# Patient Record
Sex: Female | Born: 1935 | Race: White | Hispanic: No | Smoking: Never smoker
Health system: Southern US, Community
[De-identification: ages and names within clinical notes are randomized; demographics above are authoritative.]

## PROBLEM LIST (undated history)

## (undated) DIAGNOSIS — M1990 Unspecified osteoarthritis, unspecified site: Secondary | ICD-10-CM

## (undated) DIAGNOSIS — E785 Hyperlipidemia, unspecified: Secondary | ICD-10-CM

## (undated) DIAGNOSIS — R634 Abnormal weight loss: Secondary | ICD-10-CM

## (undated) DIAGNOSIS — R911 Solitary pulmonary nodule: Secondary | ICD-10-CM

## (undated) DIAGNOSIS — I2510 Atherosclerotic heart disease of native coronary artery without angina pectoris: Secondary | ICD-10-CM

## (undated) DIAGNOSIS — M4800 Spinal stenosis, site unspecified: Secondary | ICD-10-CM

## (undated) DIAGNOSIS — I679 Cerebrovascular disease, unspecified: Secondary | ICD-10-CM

## (undated) DIAGNOSIS — A310 Pulmonary mycobacterial infection: Secondary | ICD-10-CM

## (undated) DIAGNOSIS — K529 Noninfective gastroenteritis and colitis, unspecified: Secondary | ICD-10-CM

## (undated) DIAGNOSIS — K219 Gastro-esophageal reflux disease without esophagitis: Secondary | ICD-10-CM

## (undated) DIAGNOSIS — J189 Pneumonia, unspecified organism: Secondary | ICD-10-CM

## (undated) DIAGNOSIS — I219 Acute myocardial infarction, unspecified: Secondary | ICD-10-CM

## (undated) DIAGNOSIS — M549 Dorsalgia, unspecified: Secondary | ICD-10-CM

## (undated) DIAGNOSIS — C189 Malignant neoplasm of colon, unspecified: Secondary | ICD-10-CM

## (undated) DIAGNOSIS — I10 Essential (primary) hypertension: Secondary | ICD-10-CM

## (undated) HISTORY — DX: Malignant neoplasm of colon, unspecified: C18.9

## (undated) HISTORY — DX: Gastro-esophageal reflux disease without esophagitis: K21.9

## (undated) HISTORY — DX: Unspecified osteoarthritis, unspecified site: M19.90

## (undated) HISTORY — DX: Abnormal weight loss: R63.4

## (undated) HISTORY — DX: Solitary pulmonary nodule: R91.1

## (undated) HISTORY — PX: TONSILLECTOMY: SUR1361

## (undated) HISTORY — DX: Pneumonia, unspecified organism: J18.9

## (undated) HISTORY — PX: BLADDER SUSPENSION: SHX72

## (undated) HISTORY — DX: Atherosclerotic heart disease of native coronary artery without angina pectoris: I25.10

## (undated) HISTORY — DX: Essential (primary) hypertension: I10

## (undated) HISTORY — PX: CATARACT EXTRACTION: SUR2

## (undated) HISTORY — PX: EYE SURGERY: SHX253

## (undated) HISTORY — DX: Pulmonary mycobacterial infection: A31.0

## (undated) HISTORY — PX: APPENDECTOMY: SHX54

## (undated) HISTORY — DX: Cerebrovascular disease, unspecified: I67.9

## (undated) HISTORY — PX: ESOPHAGOGASTRODUODENOSCOPY: SHX1529

## (undated) HISTORY — PX: KYPHOSIS SURGERY: SHX114

## (undated) HISTORY — PX: COLONOSCOPY: SHX174

---

## 1972-04-11 HISTORY — PX: TOTAL ABDOMINAL HYSTERECTOMY: SHX209

## 1992-04-11 DIAGNOSIS — I2510 Atherosclerotic heart disease of native coronary artery without angina pectoris: Secondary | ICD-10-CM

## 1992-04-11 HISTORY — DX: Atherosclerotic heart disease of native coronary artery without angina pectoris: I25.10

## 1994-04-11 DIAGNOSIS — C189 Malignant neoplasm of colon, unspecified: Secondary | ICD-10-CM

## 1994-04-11 HISTORY — PX: PARTIAL COLECTOMY: SHX5273

## 1994-04-11 HISTORY — DX: Malignant neoplasm of colon, unspecified: C18.9

## 2000-04-11 DIAGNOSIS — K219 Gastro-esophageal reflux disease without esophagitis: Secondary | ICD-10-CM

## 2000-04-11 HISTORY — DX: Gastro-esophageal reflux disease without esophagitis: K21.9

## 2000-09-27 ENCOUNTER — Ambulatory Visit (HOSPITAL_COMMUNITY): Admission: RE | Admit: 2000-09-27 | Discharge: 2000-09-27 | Payer: Self-pay | Admitting: Internal Medicine

## 2000-09-27 ENCOUNTER — Encounter: Payer: Self-pay | Admitting: Internal Medicine

## 2000-10-20 ENCOUNTER — Ambulatory Visit (HOSPITAL_COMMUNITY): Admission: RE | Admit: 2000-10-20 | Discharge: 2000-10-20 | Payer: Self-pay | Admitting: Internal Medicine

## 2001-07-17 ENCOUNTER — Ambulatory Visit (HOSPITAL_COMMUNITY): Admission: RE | Admit: 2001-07-17 | Discharge: 2001-07-17 | Payer: Self-pay | Admitting: Cardiology

## 2002-04-08 ENCOUNTER — Encounter: Payer: Self-pay | Admitting: Orthopaedic Surgery

## 2002-04-10 ENCOUNTER — Ambulatory Visit (HOSPITAL_COMMUNITY): Admission: RE | Admit: 2002-04-10 | Discharge: 2002-04-11 | Payer: Self-pay | Admitting: Orthopaedic Surgery

## 2002-04-10 ENCOUNTER — Encounter: Payer: Self-pay | Admitting: Orthopaedic Surgery

## 2002-04-10 ENCOUNTER — Encounter (INDEPENDENT_AMBULATORY_CARE_PROVIDER_SITE_OTHER): Payer: Self-pay | Admitting: Specialist

## 2002-09-17 ENCOUNTER — Encounter: Payer: Self-pay | Admitting: Obstetrics and Gynecology

## 2002-09-17 ENCOUNTER — Ambulatory Visit (HOSPITAL_COMMUNITY): Admission: RE | Admit: 2002-09-17 | Discharge: 2002-09-17 | Payer: Self-pay | Admitting: Obstetrics and Gynecology

## 2002-09-17 ENCOUNTER — Encounter: Payer: Self-pay | Admitting: Urology

## 2002-09-17 ENCOUNTER — Ambulatory Visit (HOSPITAL_COMMUNITY): Admission: RE | Admit: 2002-09-17 | Discharge: 2002-09-17 | Payer: Self-pay | Admitting: Urology

## 2003-02-21 ENCOUNTER — Ambulatory Visit (HOSPITAL_COMMUNITY): Admission: RE | Admit: 2003-02-21 | Discharge: 2003-02-21 | Payer: Self-pay | Admitting: Obstetrics and Gynecology

## 2003-02-27 ENCOUNTER — Encounter: Payer: Self-pay | Admitting: Cardiology

## 2003-07-03 ENCOUNTER — Ambulatory Visit (HOSPITAL_COMMUNITY): Admission: RE | Admit: 2003-07-03 | Discharge: 2003-07-03 | Payer: Self-pay | Admitting: Internal Medicine

## 2003-07-11 ENCOUNTER — Ambulatory Visit (HOSPITAL_COMMUNITY): Admission: RE | Admit: 2003-07-11 | Discharge: 2003-07-11 | Payer: Self-pay | Admitting: Internal Medicine

## 2003-09-04 ENCOUNTER — Ambulatory Visit (HOSPITAL_COMMUNITY): Admission: RE | Admit: 2003-09-04 | Discharge: 2003-09-04 | Payer: Self-pay | Admitting: Colon and Rectal Surgery

## 2004-02-26 ENCOUNTER — Ambulatory Visit: Payer: Self-pay | Admitting: Cardiology

## 2004-03-01 ENCOUNTER — Ambulatory Visit (HOSPITAL_COMMUNITY): Admission: RE | Admit: 2004-03-01 | Discharge: 2004-03-01 | Payer: Self-pay | Admitting: Cardiology

## 2004-03-01 ENCOUNTER — Ambulatory Visit: Payer: Self-pay | Admitting: Cardiology

## 2004-03-15 ENCOUNTER — Ambulatory Visit: Payer: Self-pay | Admitting: Internal Medicine

## 2004-03-23 ENCOUNTER — Ambulatory Visit: Payer: Self-pay | Admitting: Internal Medicine

## 2004-03-23 ENCOUNTER — Ambulatory Visit (HOSPITAL_COMMUNITY): Admission: RE | Admit: 2004-03-23 | Discharge: 2004-03-23 | Payer: Self-pay | Admitting: Internal Medicine

## 2004-11-18 ENCOUNTER — Ambulatory Visit (HOSPITAL_COMMUNITY): Admission: RE | Admit: 2004-11-18 | Discharge: 2004-11-18 | Payer: Self-pay | Admitting: Obstetrics and Gynecology

## 2004-11-30 ENCOUNTER — Encounter: Admission: RE | Admit: 2004-11-30 | Discharge: 2004-11-30 | Payer: Self-pay | Admitting: Obstetrics and Gynecology

## 2005-03-18 ENCOUNTER — Ambulatory Visit: Payer: Self-pay | Admitting: Cardiology

## 2005-03-25 ENCOUNTER — Ambulatory Visit: Payer: Self-pay | Admitting: *Deleted

## 2005-03-25 ENCOUNTER — Encounter (HOSPITAL_COMMUNITY): Admission: RE | Admit: 2005-03-25 | Discharge: 2005-03-25 | Payer: Self-pay | Admitting: Cardiology

## 2005-04-07 ENCOUNTER — Ambulatory Visit (HOSPITAL_COMMUNITY): Admission: RE | Admit: 2005-04-07 | Discharge: 2005-04-07 | Payer: Self-pay | Admitting: Cardiology

## 2005-04-07 ENCOUNTER — Ambulatory Visit: Payer: Self-pay | Admitting: Cardiology

## 2005-04-12 ENCOUNTER — Inpatient Hospital Stay (HOSPITAL_BASED_OUTPATIENT_CLINIC_OR_DEPARTMENT_OTHER): Admission: RE | Admit: 2005-04-12 | Discharge: 2005-04-12 | Payer: Self-pay | Admitting: Cardiology

## 2005-04-12 ENCOUNTER — Ambulatory Visit: Payer: Self-pay | Admitting: Cardiology

## 2005-05-02 ENCOUNTER — Ambulatory Visit: Payer: Self-pay | Admitting: *Deleted

## 2005-06-23 ENCOUNTER — Encounter (HOSPITAL_BASED_OUTPATIENT_CLINIC_OR_DEPARTMENT_OTHER): Admission: RE | Admit: 2005-06-23 | Discharge: 2005-09-21 | Payer: Self-pay | Admitting: Surgery

## 2005-07-29 ENCOUNTER — Ambulatory Visit: Payer: Self-pay | Admitting: Cardiology

## 2005-08-01 ENCOUNTER — Ambulatory Visit (HOSPITAL_COMMUNITY): Admission: RE | Admit: 2005-08-01 | Discharge: 2005-08-01 | Payer: Self-pay | Admitting: Cardiology

## 2005-09-21 ENCOUNTER — Ambulatory Visit: Payer: Self-pay | Admitting: Cardiology

## 2005-10-27 ENCOUNTER — Emergency Department (HOSPITAL_COMMUNITY): Admission: EM | Admit: 2005-10-27 | Discharge: 2005-10-27 | Payer: Self-pay | Admitting: Emergency Medicine

## 2005-11-13 ENCOUNTER — Encounter: Admission: RE | Admit: 2005-11-13 | Discharge: 2005-11-13 | Payer: Self-pay | Admitting: Orthopaedic Surgery

## 2005-12-01 ENCOUNTER — Ambulatory Visit (HOSPITAL_COMMUNITY): Admission: RE | Admit: 2005-12-01 | Discharge: 2005-12-01 | Payer: Self-pay | Admitting: Obstetrics and Gynecology

## 2005-12-05 ENCOUNTER — Ambulatory Visit (HOSPITAL_BASED_OUTPATIENT_CLINIC_OR_DEPARTMENT_OTHER): Admission: RE | Admit: 2005-12-05 | Discharge: 2005-12-05 | Payer: Self-pay | Admitting: Orthopaedic Surgery

## 2006-01-19 ENCOUNTER — Encounter (HOSPITAL_COMMUNITY): Admission: RE | Admit: 2006-01-19 | Discharge: 2006-02-18 | Payer: Self-pay | Admitting: Family Medicine

## 2006-02-02 ENCOUNTER — Ambulatory Visit: Payer: Self-pay | Admitting: Internal Medicine

## 2006-02-07 ENCOUNTER — Ambulatory Visit (HOSPITAL_COMMUNITY): Admission: RE | Admit: 2006-02-07 | Discharge: 2006-02-07 | Payer: Self-pay | Admitting: Internal Medicine

## 2006-02-20 ENCOUNTER — Ambulatory Visit: Payer: Self-pay | Admitting: Internal Medicine

## 2006-03-07 ENCOUNTER — Ambulatory Visit (HOSPITAL_COMMUNITY): Admission: RE | Admit: 2006-03-07 | Discharge: 2006-03-07 | Payer: Self-pay | Admitting: Internal Medicine

## 2006-03-07 ENCOUNTER — Ambulatory Visit: Payer: Self-pay | Admitting: Internal Medicine

## 2006-10-16 ENCOUNTER — Ambulatory Visit (HOSPITAL_COMMUNITY): Admission: RE | Admit: 2006-10-16 | Discharge: 2006-10-16 | Payer: Self-pay | Admitting: Cardiology

## 2006-10-20 ENCOUNTER — Ambulatory Visit: Payer: Self-pay | Admitting: Cardiology

## 2007-04-12 DIAGNOSIS — R634 Abnormal weight loss: Secondary | ICD-10-CM

## 2007-04-12 HISTORY — DX: Abnormal weight loss: R63.4

## 2007-06-04 ENCOUNTER — Encounter: Payer: Self-pay | Admitting: Cardiology

## 2007-06-04 ENCOUNTER — Ambulatory Visit (HOSPITAL_COMMUNITY): Admission: RE | Admit: 2007-06-04 | Discharge: 2007-06-04 | Payer: Self-pay | Admitting: Family Medicine

## 2007-06-05 ENCOUNTER — Ambulatory Visit (HOSPITAL_COMMUNITY): Admission: RE | Admit: 2007-06-05 | Discharge: 2007-06-05 | Payer: Self-pay | Admitting: Family Medicine

## 2007-06-14 ENCOUNTER — Ambulatory Visit: Payer: Self-pay | Admitting: Internal Medicine

## 2007-06-22 ENCOUNTER — Ambulatory Visit (HOSPITAL_COMMUNITY): Admission: RE | Admit: 2007-06-22 | Discharge: 2007-06-22 | Payer: Self-pay | Admitting: Internal Medicine

## 2007-06-22 ENCOUNTER — Ambulatory Visit: Payer: Self-pay | Admitting: Internal Medicine

## 2007-07-05 ENCOUNTER — Ambulatory Visit: Payer: Self-pay | Admitting: Cardiology

## 2007-07-09 ENCOUNTER — Encounter: Payer: Self-pay | Admitting: Cardiology

## 2007-07-09 ENCOUNTER — Ambulatory Visit: Payer: Self-pay | Admitting: Cardiology

## 2007-07-09 ENCOUNTER — Ambulatory Visit (HOSPITAL_COMMUNITY): Admission: RE | Admit: 2007-07-09 | Discharge: 2007-07-09 | Payer: Self-pay | Admitting: Cardiology

## 2007-08-16 ENCOUNTER — Ambulatory Visit (HOSPITAL_COMMUNITY): Admission: RE | Admit: 2007-08-16 | Discharge: 2007-08-16 | Payer: Self-pay | Admitting: Family Medicine

## 2008-08-15 ENCOUNTER — Encounter: Payer: Self-pay | Admitting: Cardiology

## 2008-09-10 ENCOUNTER — Ambulatory Visit (HOSPITAL_COMMUNITY): Admission: RE | Admit: 2008-09-10 | Discharge: 2008-09-10 | Payer: Self-pay | Admitting: Family Medicine

## 2008-09-13 ENCOUNTER — Emergency Department (HOSPITAL_COMMUNITY): Admission: EM | Admit: 2008-09-13 | Discharge: 2008-09-13 | Payer: Self-pay | Admitting: Emergency Medicine

## 2008-10-07 DIAGNOSIS — M1990 Unspecified osteoarthritis, unspecified site: Secondary | ICD-10-CM | POA: Insufficient documentation

## 2008-10-29 ENCOUNTER — Ambulatory Visit (HOSPITAL_COMMUNITY): Admission: RE | Admit: 2008-10-29 | Discharge: 2008-10-29 | Payer: Self-pay | Admitting: Family Medicine

## 2008-10-29 ENCOUNTER — Encounter: Payer: Self-pay | Admitting: Cardiology

## 2008-10-31 ENCOUNTER — Ambulatory Visit (HOSPITAL_COMMUNITY): Admission: RE | Admit: 2008-10-31 | Discharge: 2008-10-31 | Payer: Self-pay | Admitting: Family Medicine

## 2008-12-23 ENCOUNTER — Encounter (INDEPENDENT_AMBULATORY_CARE_PROVIDER_SITE_OTHER): Payer: Self-pay | Admitting: *Deleted

## 2009-01-07 ENCOUNTER — Ambulatory Visit: Payer: Self-pay | Admitting: Internal Medicine

## 2009-01-09 ENCOUNTER — Encounter: Payer: Self-pay | Admitting: Internal Medicine

## 2009-01-14 LAB — CONVERTED CEMR LAB
Bilirubin, Direct: 0.1 mg/dL (ref 0.0–0.3)
Indirect Bilirubin: 0.4 mg/dL (ref 0.0–0.9)
Total Bilirubin: 0.5 mg/dL (ref 0.3–1.2)

## 2009-01-21 ENCOUNTER — Ambulatory Visit (HOSPITAL_COMMUNITY): Admission: RE | Admit: 2009-01-21 | Discharge: 2009-01-21 | Payer: Self-pay | Admitting: Internal Medicine

## 2009-01-21 ENCOUNTER — Ambulatory Visit: Payer: Self-pay | Admitting: Internal Medicine

## 2009-02-18 ENCOUNTER — Encounter: Payer: Self-pay | Admitting: Internal Medicine

## 2009-02-25 ENCOUNTER — Ambulatory Visit (HOSPITAL_COMMUNITY): Admission: RE | Admit: 2009-02-25 | Discharge: 2009-02-25 | Payer: Self-pay | Admitting: Family Medicine

## 2009-04-11 DIAGNOSIS — K529 Noninfective gastroenteritis and colitis, unspecified: Secondary | ICD-10-CM

## 2009-04-11 HISTORY — DX: Noninfective gastroenteritis and colitis, unspecified: K52.9

## 2009-06-19 ENCOUNTER — Ambulatory Visit (HOSPITAL_COMMUNITY): Admission: RE | Admit: 2009-06-19 | Discharge: 2009-06-19 | Payer: Self-pay | Admitting: Family Medicine

## 2009-06-19 ENCOUNTER — Encounter (INDEPENDENT_AMBULATORY_CARE_PROVIDER_SITE_OTHER): Payer: Self-pay | Admitting: *Deleted

## 2009-06-19 LAB — CONVERTED CEMR LAB
Albumin: 4 g/dL
Albumin: 4 g/dL
Alkaline Phosphatase: 88 units/L
BUN: 17 mg/dL
Basophils Relative: 0 %
CO2: 25 meq/L
Calcium: 9.3 mg/dL
Chloride: 101 meq/L
Creatinine, Ser: 0.87 mg/dL
Creatinine, Ser: 0.87 mg/dL
Eosinophils Absolute: 0.2 10*3/uL
GFR calc non Af Amer: >60 mL/min
Glomerular Filtration Rate, Af Am: >60 mL/min/{1.73_m2}
Glomerular Filtration Rate, Af Am: >60 mL/min/{1.73_m2}
Glucose, Bld: 93 mg/dL
Glucose, Bld: 93 mg/dL
HCT: 37.1 %
HCT: 37.1 %
Hemoglobin: 12.8 g/dL
Hemoglobin: 12.8 g/dL
Lipase: 16 units/L
Lymphs Abs: 2 10*3/uL
Monocytes Absolute: 0.7 10*3/uL
Monocytes Relative: 10 %
Potassium: 3.9 meq/L
Sodium: 133 meq/L
Total Protein: 7 g/dL
WBC: 7.2 10*3/uL
WBC: 7.2 10*3/uL

## 2009-06-22 ENCOUNTER — Encounter: Payer: Self-pay | Admitting: Urgent Care

## 2009-06-30 ENCOUNTER — Ambulatory Visit (HOSPITAL_COMMUNITY): Admission: RE | Admit: 2009-06-30 | Discharge: 2009-06-30 | Payer: Self-pay | Admitting: Family Medicine

## 2009-07-06 ENCOUNTER — Encounter (HOSPITAL_COMMUNITY): Admission: RE | Admit: 2009-07-06 | Discharge: 2009-08-05 | Payer: Self-pay | Admitting: Family Medicine

## 2009-10-05 ENCOUNTER — Ambulatory Visit (HOSPITAL_COMMUNITY): Admission: RE | Admit: 2009-10-05 | Discharge: 2009-10-05 | Payer: Self-pay | Admitting: Preventative Medicine

## 2009-10-21 ENCOUNTER — Emergency Department (HOSPITAL_COMMUNITY): Admission: EM | Admit: 2009-10-21 | Discharge: 2009-10-21 | Payer: Self-pay | Admitting: Emergency Medicine

## 2009-10-30 ENCOUNTER — Ambulatory Visit: Payer: Self-pay | Admitting: Internal Medicine

## 2009-10-30 DIAGNOSIS — K5289 Other specified noninfective gastroenteritis and colitis: Secondary | ICD-10-CM

## 2009-11-02 LAB — CONVERTED CEMR LAB
Free T4: 0.99 ng/dL (ref 0.80–1.80)
TSH: 2.181 microintl units/mL (ref 0.350–4.500)

## 2009-11-03 ENCOUNTER — Encounter: Payer: Self-pay | Admitting: Internal Medicine

## 2009-11-03 ENCOUNTER — Encounter: Payer: Self-pay | Admitting: Gastroenterology

## 2009-11-05 ENCOUNTER — Ambulatory Visit (HOSPITAL_COMMUNITY): Admission: RE | Admit: 2009-11-05 | Discharge: 2009-11-05 | Payer: Self-pay | Admitting: Internal Medicine

## 2009-11-13 ENCOUNTER — Encounter: Payer: Self-pay | Admitting: Gastroenterology

## 2009-11-18 ENCOUNTER — Encounter: Payer: Self-pay | Admitting: Internal Medicine

## 2009-11-24 ENCOUNTER — Ambulatory Visit: Payer: Self-pay | Admitting: Internal Medicine

## 2009-11-24 ENCOUNTER — Ambulatory Visit (HOSPITAL_COMMUNITY): Admission: RE | Admit: 2009-11-24 | Discharge: 2009-11-24 | Payer: Self-pay | Admitting: Internal Medicine

## 2009-12-03 ENCOUNTER — Telehealth (INDEPENDENT_AMBULATORY_CARE_PROVIDER_SITE_OTHER): Payer: Self-pay

## 2009-12-24 ENCOUNTER — Encounter (INDEPENDENT_AMBULATORY_CARE_PROVIDER_SITE_OTHER): Payer: Self-pay | Admitting: *Deleted

## 2009-12-25 ENCOUNTER — Ambulatory Visit: Payer: Self-pay | Admitting: Cardiology

## 2009-12-25 DIAGNOSIS — I679 Cerebrovascular disease, unspecified: Secondary | ICD-10-CM

## 2009-12-29 ENCOUNTER — Encounter: Payer: Self-pay | Admitting: Cardiology

## 2010-01-01 ENCOUNTER — Ambulatory Visit (HOSPITAL_COMMUNITY)
Admission: RE | Admit: 2010-01-01 | Discharge: 2010-01-01 | Payer: Self-pay | Source: Home / Self Care | Admitting: Cardiology

## 2010-01-06 ENCOUNTER — Encounter: Payer: Self-pay | Admitting: Cardiology

## 2010-01-11 ENCOUNTER — Telehealth (INDEPENDENT_AMBULATORY_CARE_PROVIDER_SITE_OTHER): Payer: Self-pay | Admitting: *Deleted

## 2010-01-14 ENCOUNTER — Telehealth (INDEPENDENT_AMBULATORY_CARE_PROVIDER_SITE_OTHER): Payer: Self-pay | Admitting: *Deleted

## 2010-05-02 ENCOUNTER — Encounter: Payer: Self-pay | Admitting: Obstetrics and Gynecology

## 2010-05-11 NOTE — Letter (Signed)
Summary: tcs/egd order  tcs/egd order   Imported By: Hendricks Limes LPN 16/01/9603 54:09:81  _____________________________________________________________________  External Attachment:    Type:   Image     Comment:   External Document

## 2010-05-11 NOTE — Letter (Signed)
Summary: ORDERS  ORDERS   Imported By: Faythe Ghee 12/29/2009 09:59:47  _____________________________________________________________________  External Attachment:    Type:   Image     Comment:   External Document

## 2010-05-11 NOTE — Medication Information (Signed)
Summary: PREVACID 24 HR OTC 15MG   PREVACID 24 HR OTC 15MG    Imported By: Rexene Alberts 11/13/2009 11:06:27  _____________________________________________________________________  External Attachment:    Type:   Image     Comment:   External Document  Appended Document: PREVACID 24 HR OTC 15MG     Prescriptions: PREVACID 24HR 15 MG CPDR (LANSOPRAZOLE) one by mouth 30 mins before breakfast daily  #42 x 11   Entered and Authorized by:   Leanna Battles. Dixon Boos   Signed by:   Leanna Battles Dixon Boos on 11/13/2009   Method used:   Electronically to        [facility] (retail)       [address]       [place], [place]  [address]       Ph: [phone]       Fax: [phone]   RxID:   [id number]

## 2010-05-11 NOTE — Progress Notes (Signed)
   Phone Note Call from Patient   Caller: [name] Reason for Call: Talk to Nurse Summary of Call: Kayla Lane called to check on the form she left with the nurse on her last visit 12/25/2009 to have her medications filled by mail.  She has note heard anything and would like to follow up with the nurse.  I looked into the EMR and did not see any document for mail ordering meds.  You can call her at [phone] Initial call taken by: Alexis Goodell  Follow-up for Phone Call        I spoke with pt and she requested 90 day supplies from [facility] Follow-up by: Teressa Lower RN,  January 12, 2010 9:57 AM    Prescriptions: COZAAR 100 MG TABS (LOSARTAN POTASSIUM) take 1 tab dialy  # 90 x 4   Entered by:   Teressa Lower RN   Authorized by:   Kathlen Brunswick, MD, [facility]   Signed by:   Teressa Lower RN on 01/12/2010   Method used:   Electronically to        [facility] (retail)       [address]       [place], [place]  [address]       Ph: [phone]       Fax: [phone]   RxID:   [id number] BISOPROLOL-HYDROCHLOROTHIAZIDE 5-6.25 MG TABS (BISOPROLOL-HYDROCHLOROTHIAZIDE) Take 1 tablet by mouth once a day  #90 x 4   Entered by:   Teressa Lower RN   Authorized by:   Kathlen Brunswick, MD, [facility]   Signed by:   Teressa Lower RN on 01/12/2010   Method used:   Electronically to        [facility] (retail)       [address]       [place], [place]  [address]       Ph: [phone]       Fax: [phone]   RxID:   [id number] SIMVASTATIN 40 MG TABS (SIMVASTATIN) Take one tablet by mouth daily at bedtime  #90 x 4   Entered by:   Teressa Lower RN   Authorized by:   Kathlen Brunswick, MD, [facility]   Signed by:   Teressa Lower RN on 01/12/2010   Method used:   Electronically to        [facility] (retail)       [address]       [place], [place]  [address]       Ph:  [phone]       Fax: [phone]   RxID:   [id number]

## 2010-05-11 NOTE — Medication Information (Signed)
Summary: Tax adviser   Imported By: Diana Eves 06/22/2009 14:48:54  _____________________________________________________________________  External Attachment:    Type:   Image     Comment:   External Document  Appended Document: RX Folder per RMR OP note, pt on prevacid 30mg  daily, please verify dose w/ pt prior to RF  Appended Document: RX Folder spoke to pt- she is taking prevacid 15mg . Last ov note has prevacid 15mg .  Appended Document: pREVACID    Prescriptions: PREVACID 15 MG CPDR (LANSOPRAZOLE) Take 1 tablet by mouth once a day  #31 x 11   Entered and Authorized by:   Joselyn Arrow FNP-BC   Signed by:   Joselyn Arrow FNP-BC on 06/22/2009   Method used:   Electronically to        [facility] (mail-order)             ,          Ph: [phone]       Fax: [phone]   RxID:   [id number]     Appended Document: RX Folder Disregard [facility], please call Domenic Schwab w/ rx as above  Appended Document: RX Folder spoke to [place] [facility]- she said they have it

## 2010-05-11 NOTE — Letter (Signed)
Summary: records from [facility]  records from [facility]   Imported By: Rosine Beat 11/03/2009 09:47:08  _____________________________________________________________________  External Attachment:    Type:   Image     Comment:   External Document

## 2010-05-11 NOTE — Miscellaneous (Signed)
Summary: LABS CMP,CBCD,06/19/2009  Clinical Lists Changes  Observations: Added new observation of CALCIUM: 9.3 mg/dL (21/30/8657 84:69) Added new observation of ALBUMIN: 4.0 g/dL (62/95/2841 32:44) Added new observation of PROTEIN, TOT: 7.0 g/dL (04/13/7251 66:44) Added new observation of SGPT (ALT): 17 units/L (06/19/2009 10:26) Added new observation of SGOT (AST): 30 units/L (06/19/2009 10:26) Added new observation of ALK PHOS: 88 units/L (06/19/2009 10:26) Added new observation of GFR AA: >60 mL/min/1.59m2 (06/19/2009 10:26) Added new observation of GFR: >60 mL/min (06/19/2009 10:26) Added new observation of CREATININE: 0.87 mg/dL (03/47/4259 56:38) Added new observation of BUN: 17 mg/dL (75/64/3329 51:88) Added new observation of BG RANDOM: 93 mg/dL (41/66/0630 16:01) Added new observation of CO2 PLSM/SER: 25 meq/L (06/19/2009 10:26) Added new observation of CL SERUM: 101 meq/L (06/19/2009 10:26) Added new observation of K SERUM: 3.9 meq/L (06/19/2009 10:26) Added new observation of NA: 133 meq/L (06/19/2009 10:26) Added new observation of ABSOLUTE BAS: 0.0 K/uL (06/19/2009 10:26) Added new observation of BASOPHIL %: 0 % (06/19/2009 10:26) Added new observation of EOS ABSLT: 0.2 K/uL (06/19/2009 10:26) Added new observation of % EOS AUTO: 3 % (06/19/2009 10:26) Added new observation of ABSOLUTE MON: 0.7 K/uL (06/19/2009 10:26) Added new observation of MONOCYTE %: 10 % (06/19/2009 10:26) Added new observation of ABS LYMPHOCY: 2.0 K/uL (06/19/2009 10:26) Added new observation of LYMPHS %: 27 % (06/19/2009 10:26) Added new observation of PLATELETK/UL: 201 K/uL (06/19/2009 10:26) Added new observation of RDW: 14.2 % (06/19/2009 10:26) Added new observation of MCHC RBC: 34.4 g/dL (09/32/3557 32:20) Added new observation of MCV: 93.5 fL (06/19/2009 10:26) Added new observation of HCT: 37.1 % (06/19/2009 10:26) Added new observation of HGB: 12.8 g/dL (25/42/7062 37:62) Added new  observation of RBC M/UL: 3.96 M/uL (06/19/2009 10:26) Added new observation of WBC COUNT: 7.2 10*3/microliter (06/19/2009 10:26)

## 2010-05-11 NOTE — Miscellaneous (Signed)
Summary: CHEST CT 11/05/2009 , ECHO 07/09/2007  Clinical Lists Changes  Observations: Added new observation of CT OF CHEST:   Clinical Data: Chronic cough and weight loss.  Abnormal chest CT 1   year ago.    CT CHEST WITH CONTRAST    Technique:  Multidetector CT imaging of the chest was performed   following the standard protocol during bolus administration of   intravenous contrast.    Contrast: 80 ml Omnipaque-300    Comparison: 10/31/2008    Findings: High right paratracheal lymph nodes measure up to 6 mm   short axis, stable.  Low right paratracheal lymph node measures 7   mm, stable.  No hilar or axillary adenopathy.  Pulmonary arteries   are enlarged.  Coronary artery calcification.  Heart size normal.   No pericardial effusion.    Minimal biapical pleural parenchymal scarring.  There are scattered   areas of residual peribronchovascular nodularity, which are seen in   association with mild bronchiectasis and scattered areas of volume   loss.  Findings have improved when compared with 10/31/2008.  There   are calcified granulomas in the lingula.  No dominant pulmonary   nodule.  No pleural fluid.  Airway is unremarkable.    Incidental imaging of the upper abdomen shows renal cortical   scarring bilaterally.  Gastric wall appears thickened, as on   10/31/2008.  No worrisome lytic or sclerotic lesions.  Degenerative   changes are seen in the spine.  T12 vertebroplasty is noted.    IMPRESSION:   Pulmonary parenchymal pattern of scattered  peribronchovascular   nodularity, bronchiectasis and volume loss has improved in the   interval and has the appearance of post infectious scarring.   Mycobacterium avium complex is also considered.  (11/05/2009 10:32) Added new observation of ECHOINTERP:   SUMMARY   -  Overall left ventricular systolic function was normal. There were         no left ventricular regional wall motion abnormalities.   -  The aortic valve was mildly  calcified. There was trivial aortic         valvular regurgitation.   -  There was mild mitral annular calcification. There was mild to         moderate mitral valvular regurgitation. The effective orifice         of mitral regurgitation by proximal isovelocity surface area         was 0.06 cm^2. The volume of mitral regurgitation by proximal         isovelocity surface area was 10 cc.   -  Left atrial size was at the upper limits of normal.   -  The estimated peak right ventricular systolic pressure was mildly         increased.    COMPARISONS   -  Compared to the previous study of 01-Mar-2004 :no significant         interval change     ---------------------------------------------------------------    Prepared and Electronically Authenticated by    [place] Bing M.D.   Confirmed 10-Jul-2007 09:20:39  Additional Information (07/09/2007 10:33)      CT of Chest  Procedure date:  11/05/2009  Findings:        Clinical Data: Chronic cough and weight loss.  Abnormal chest CT 1   year ago.    CT CHEST WITH CONTRAST    Technique:  Multidetector CT imaging of the chest was performed   following the standard protocol during  bolus administration of   intravenous contrast.    Contrast: 80 ml Omnipaque-300    Comparison: 10/31/2008    Findings: High right paratracheal lymph nodes measure up to 6 mm   short axis, stable.  Low right paratracheal lymph node measures 7   mm, stable.  No hilar or axillary adenopathy.  Pulmonary arteries   are enlarged.  Coronary artery calcification.  Heart size normal.   No pericardial effusion.    Minimal biapical pleural parenchymal scarring.  There are scattered   areas of residual peribronchovascular nodularity, which are seen in   association with mild bronchiectasis and scattered areas of volume   loss.  Findings have improved when compared with 10/31/2008.  There   are calcified granulomas in the lingula.  No dominant pulmonary    nodule.  No pleural fluid.  Airway is unremarkable.    Incidental imaging of the upper abdomen shows renal cortical   scarring bilaterally.  Gastric wall appears thickened, as on   10/31/2008.  No worrisome lytic or sclerotic lesions.  Degenerative   changes are seen in the spine.  T12 vertebroplasty is noted.    IMPRESSION:   Pulmonary parenchymal pattern of scattered  peribronchovascular   nodularity, bronchiectasis and volume loss has improved in the   interval and has the appearance of post infectious scarring.   Mycobacterium avium complex is also considered.   Echocardiogram  Procedure date:  07/09/2007  Findings:        SUMMARY   -  Overall left ventricular systolic function was normal. There were         no left ventricular regional wall motion abnormalities.   -  The aortic valve was mildly calcified. There was trivial aortic         valvular regurgitation.   -  There was mild mitral annular calcification. There was mild to         moderate mitral valvular regurgitation. The effective orifice         of mitral regurgitation by proximal isovelocity surface area         was 0.06 cm^2. The volume of mitral regurgitation by proximal         isovelocity surface area was 10 cc.   -  Left atrial size was at the upper limits of normal.   -  The estimated peak right ventricular systolic pressure was mildly         increased.    COMPARISONS   -  Compared to the previous study of 01-Mar-2004 :no significant         interval change     ---------------------------------------------------------------    Prepared and Electronically Authenticated by    [place] Bing M.D.   Confirmed 10-Jul-2007 09:20:39  Additional Information

## 2010-05-11 NOTE — Assessment & Plan Note (Signed)
Summary: POSITIVE HX OF COLON CANCER,ABD PAIN,WT LOSS OF 6LBS IN ONE M...   Visit Type:  Initial Consult Referring Provider:  Fusco/PA Primary Care Provider:  Phillips Odor  Chief Complaint:  abd pain/wt loss/ history of crc.  History of Present Illness: Kayla Lane back at the request of Dr. Phillips Odor for abd pain, ongoing weight loss. Last seen at time of EGD/TCS last fall. See below for details. She c/o right sided abd pain off/on for several months. She had CT A/P in 3/11, Abd u/s, Hida. All unremarkable except for some slight gb wall thickening on u/s. Her LFTs, CBC, lipase all normal. She states over the past couple of months she has been on ABX for UTI, PNA. On 10/05/09, she had another CT A/P which showed mild increase in patchy, somewhat linear density at both lung bases, most pronounced in the right middle lobe and lingula. Prominent stool is again noted. Diffuse osteopenia.  Lumbar and lower thoracic spine degenerative changes with moderate to marked spinal stenosis at the L4-5 level and moderate spinal stenosis at the L3-4 level.  Stable grade 1  anterolisthesis at the L4-5 level due to facet degenerative changes at that level.  Stable thoracolumbar scoliosis and mild T12 vertebral compression deformity with vertebroplasty material.  Few weeks ago, she noted more pain in left abd as opposed to right side. She saw Lenise Herald, PA-C at [facility] who referred her to ED. There she had her third CT since 3/11 which showed colitis of the distal transverse colon. She was started on Cipro and Flagyl.  Her lipase, LFTs looked good. Her WBC was 14,500.   She states her diffuse abd pain is gone. She still has some pain on right abd at area of previous scar. She c/o ongoing chronic cough, hoarseness, anorexia, nausea. Her BMs were normal to constipation but now 2 loose daily. Stools dark but no obvious blood. Weight down to 105 from 117 in 9/10.  HB controlled on Prevacid.       Current Medications  (verified): 1)  Cozaar 50  Mg Tabs (Losartan Potassium) .... Take One Tablet By Mouth Daily 2)  Simvastatin 40 Mg Tabs (Simvastatin) .... Take One Tablet By Mouth Daily At Bedtime 3)  Bisoprolol-Hydrochlorothiazide 5-6.25 Mg Tabs (Bisoprolol-Hydrochlorothiazide) .... Take 1 Tablet By Mouth Once A Day 4)  Prevacid 15 Mg Cpdr (Lansoprazole) .... Take 1 Tablet By Mouth Once A Day 5)  Detrol La 4 Mg Xr24h-Cap (Tolterodine Tartrate) .... Take 1 Tablet By Mouth Once A Day 6)  Asa 81 Mg .... Take 1 Tablet By Mouth Once A Day 7)  Tylenol Arthritis Pain 650 Mg Cr-Tabs (Acetaminophen) .... As Needed 8)  Miralax  Powd (Polyethylene Glycol 3350) .... As Needed  Allergies (verified): 1)  ! Pcn 2)  ! Tetracycline 3)  ! Sulfa 4)  ! * Dyes  Past History:  Family History: Last updated: 09-Jan-2009 Father: Deceased age 18   old age Mother: Deceased age 109   old age  Siblings: 2 sisters and one brother            one sister DM and fibromyalgia      brother just had one kidney removed/ malignant  Social History: Last updated: 10/07/2008 Retired  Widowed  Tobacco Use - No.  Alcohol Use - no  Past Medical History: DEGENERATIVE JOINT DISEASE (ICD-715.90) GERD (ICD-530.81) HYPERTENSION, UNSPECIFIED (ICD-401.9) HYPERLIPIDEMIA-MIXED (ICD-272.4) CAD, UNSPECIFIED SITE (ICD-414.00) H/O colon cancer TCS/EGD 10/10--> EGD:  Probable cervical esophageal web status post dilation, otherwise  normal esophagus, small hiatal hernia.  Surgical anastomosis at 18 cm.  Residual rectal and colonic mucosa appeared normal.        Past Surgical History:  Bladder tacking Abdominal Hysterectomy-Total Appendectomy Back Surgery Tonsillectomy Left colon resection 1996  Review of Systems General:  Complains of weight loss; denies fever, chills, sweats, anorexia, and weakness. Eyes:  Denies vision loss. ENT:  Complains of hoarseness; denies difficulty swallowing. CV:  Denies chest pains, angina, palpitations,  dyspnea on exertion, and peripheral edema. Resp:  Complains of cough; denies dyspnea at rest, dyspnea with exercise, sputum, and wheezing. GI:  See HPI. GU:  Denies urinary burning and blood in urine. MS:  Denies joint pain / LOM. Derm:  Denies rash and itching. Neuro:  Denies weakness, frequent headaches, memory loss, and confusion. Psych:  Denies depression and anxiety. Endo:  Complains of unusual weight change. Heme:  Denies bruising and bleeding. Allergy:  Denies hives and rash.  Vital Signs:  Patient profile:   77 year old female Height:      64 inches Weight:      105 pounds BMI:     18.09 Temp:     98.3 degrees F oral Pulse rate:   76 / minute BP sitting:   120 / 80  (left arm) Cuff size:   regular  Vitals Entered By: Cloria Spring LPN (October 30, 2009 11:44 AM)  Physical Exam  General:  Thin, elderly WF in NAD Head:  Normocephalic and atraumatic. Eyes:  sclera nonicteric Mouth:  Oropharyngeal mucosa moist, pink.  No lesions, erythema or exudate.    Lungs:  Right lung with wheezes Heart:  Regular rate and rhythm; no murmurs, rubs,  or bruits. Abdomen:  Soft. Mild tenderness over her scar in right mid abdomen. No rebound or guarding. No abd bruit or hernia. No HSM or masses. Extremities:  No clubbing, cyanosis, edema or deformities noted. Neurologic:  Alert and  oriented x4;  grossly normal neurologically. Skin:  Intact without significant lesions or rashes. Psych:  Alert and cooperative. Normal mood and affect.  Impression & Recommendations:  Problem # 1:  COLITIS (ICD-558.9) Recent CT suggestive of colitis. Left sided abd pain improved after Cipro/Flagyl but with change in bowels. Now with loose stool. She is at risk for C.Diff given multiple courses of ABX lately. Will r/o underlying/persistent infection. TCS in 10/10 reassuring with regards to concern for prior h/o colon cancer. Will discuss further with Dr. Jena Gauss, doubt she needs relook at this  time. Orders: T-Culture, Stool ([id number]) T-Culture, C-Diff Toxin A/B ([id number]) T-Stool Giardia / Crypto- EIA (62130) Est. Patient Level III (86578)  Problem # 2:  WEIGHT LOSS (ICD-783.21) Weight loss, anorexia. Check thyroid function. Also with h/o chronic cough and abnormal Chest CT one year ago with recommendations for f/u chest CT. RML has been evaluated at time of CT A/P however RUQ has not. Chest CT with contrast.  Orders: T-Free T4 (23300) T-TSH [id number]) T-Culture, Stool ([id number]) T-Culture, C-Diff Toxin A/B ([id number]) T-Stool Giardia / Crypto- EIA (25366) Est. Patient Level III (44034) I would like to thank [facility] for allowing [place] to take part in the care of this nice patient.  Appended Document: POSITIVE HX OF COLON CANCER,ABD PAIN,WT LOSS OF 6LBS IN ONE M... Patient aware of appt. 11/05/09 @ 11am

## 2010-05-11 NOTE — Letter (Signed)
Summary: Generic Letter, Intro to Referring  [facility]  [address]   [place], [place] [address]   Phone: [phone]  Fax: [phone]      December 23, 2008             RE: Kayla Lane   [birth year]                 [address]                 [place], [place]  [address]  Dear Appt Secretary,    This pt has been scheduled an appt for 01/07/2009 @ 9:00 w/ Dr. Jena Gauss.   Pt is aware of appt.         Sincerely,    Elinor Parkinson  [facility] Ph: [phone]   Fax: [phone]

## 2010-05-11 NOTE — Letter (Signed)
Summary: Chest CT order  Chest CT order   Imported By: Minna Merritts 11/03/2009 18:36:02  _____________________________________________________________________  External Attachment:    Type:   Image     Comment:   External Document

## 2010-05-11 NOTE — Progress Notes (Signed)
   Phone Note Outgoing Call   Call placed by: Larita Fife Via LPN,  December 03, 2009 3:51 PM Details for Reason: Refills Summary of Call: [facility], Pt. has not been seen in office since 2009 and brought papers by office to fill out for her refills to fax to  [facility]. We need a local phar. (of pt's choice) to fill meds until she is seen by Dr. Dietrich Pates on 9-16-11as medications may need to be adjusted. Initial call taken by: Larita Fife Via LPN,  December 03, 2009 4:07 PM  Follow-up for Phone Call        Pt. states she has enough medication to last until appt.  Follow-up by: Larita Fife Via LPN,  December 04, 2009 8:08 AM

## 2010-05-11 NOTE — Assessment & Plan Note (Signed)
Summary: F1Y PT LAST SEEN IN 2009  Medications Added COZAAR 100 MG TABS (LOSARTAN POTASSIUM) take 1 tab dialy      Allergies Added:   Visit Type:  Follow-up Referring Provider:  . Primary Provider:  Dr. Dorthey Sawyer   History of Present Illness: [name] returns to the office at our request after having been lost to followup.  Since her most recent visit 2.5 years ago, she has done generally well.  Her major problem has been a recent 30 pound weight loss, which has been investigated by both her primary care physician and by gastroenterology.  She was found to have colitis, which has resolved with therapy; however, she has not regained a significant amount of weight.  She denies depression, cooks for herself and tries to eat as much as possible.  She does not feel that she is anorectic.  Current Medications (verified): 1)  Simvastatin 40 Mg Tabs (Simvastatin) .... Take One Tablet By Mouth Daily At Bedtime 2)  Bisoprolol-Hydrochlorothiazide 5-6.25 Mg Tabs (Bisoprolol-Hydrochlorothiazide) .... Take 1 Tablet By Mouth Once A Day 3)  Prevacid 24hr 15 Mg Cpdr (Lansoprazole) .... One By Mouth 30 Mins Before Breakfast Daily 4)  Detrol La 4 Mg Xr24h-Cap (Tolterodine Tartrate) .... Take 1 Tablet By Mouth Once A Day 5)  Asa 81 Mg .... Take 1 Tablet By Mouth Once A Day 6)  Tylenol Arthritis Pain 650 Mg Cr-Tabs (Acetaminophen) .... As Needed 7)  Miralax  Powd (Polyethylene Glycol 3350) .... As Needed 8)  Cozaar 100 Mg Tabs (Losartan Potassium) .... Take 1 Tab Dialy  Allergies (verified): 1)  ! Pcn 2)  ! Tetracycline 3)  ! Sulfa 4)  ! * Dyes  Past History:  PMH, FH, and Social History reviewed and updated.  Past Medical History: ASCVD: Stent to RCA and LAD at [facility] in 1994; neg. stress nuclear 12/98; 04/2005:40% LAD; 70% CX;      echocardiogram in 2009-normal EF; mild to moderate MR. Cerebrovascular disease: bilateral bruits; 7/08-50-69% right and left internal carotid artery  stenosis Hypertension Hyperlipidemia DEGENERATIVE JOINT DISEASE (ICD-715.90) Gastroesophageal reflux disease Colitis-2011 Carcinoma of the colon Dysphasia due to cervical web-dilated in 2002 Weight loss: 30 pounds between 2009-11     Review of Systems       The patient complains of weight loss and abdominal pain.  The patient denies anorexia, hoarseness, chest pain, syncope, dyspnea on exertion, peripheral edema, prolonged cough, headaches, hemoptysis, melena, and hematochezia.    Vital Signs:  Patient profile:   77 year old female Weight:      105 pounds BMI:     18.09 Pulse rate:   80 / minute BP sitting:   137 / 63  (right arm)  Vitals Entered By: Dreama Saa, CNA (December 25, 2009 2:35 PM)  Physical Exam  General:  Thin; well-developed; no acute distress:   Neck-No JVD; high-pitched right carotid bruits; ecchymosis anteriorly, where the patient indicates she was struck by a branch while gardening Lungs-No tachypnea, no rales; no rhonchi; no wheezes; mild kyphosis Cardiovascular-pulse irregularity; normal PMI; normal S1 and S2; modest systolic ejection murmur Abdomen-BS normal; soft and non-tender without masses or organomegaly:  Musculoskeletal-No deformities, no cyanosis or clubbing: Neurologic-Normal cranial nerves; symmetric strength and tone:  Skin-Warm, no significant lesions: Extremities-Nl distal pulses; no edema:     Impression & Recommendations:  Problem # 1:  CAROTID ARTERY STENOSIS, BILATERAL (ICD-433.10) Most recent Duplex study was a few years ago and indicated moderate bilateral disease.  A repeat  carotid ultrasound examination will be obtained.  Problem # 2:  WEIGHT LOSS (ICD-783.21) Despite the diagnosis of colitis and appropriate treatment with resolution of abdominal discomfort, weight loss persists.  Patient will follow this closely and seek assistance from Dr. Phillips Odor and Dr. Benard Rink as necessary.  Her previous workup included a CT scan of the  chest, which was read as abnormal and possibly consistent with mycobacterium avium.  Tuberculosis is also a diagnostic possibility, but the complete absence of fever and diaphoresis would be unusual.  A PPD will be placed and read.  Pulmonary consultation would be useful in evaluating her abnormal CT scan.  12/29/09:  PPD was negative.  Problem # 3:  ATHEROSCLEROTIC CARDIOVASCULAR DISEASE (ICD-429.2) Remarkably, patient has had no problems with her heart since she presented in 1995.  Echocardiogram performed 2 years ago verified the presence of mitral regurgitation, but suggested that it was hemodynamically insignificant.  Continuing monitoring and control of cardiovascular risk factors is appropriate.  Problem # 4:  HYPERTENSION (ICD-401.9) Blood pressure control is adequate, and current medications will be continued.  Problem # 5:  HYPERLIPIDEMIA (ICD-272.4) No recent lipid profile is available; records from patient's primary care physician are pending.  Other Orders: Carotid Duplex (Carotid Duplex) TB Skin Test [phone]) Admin 1st Vaccine (60454)  Patient Instructions: 1)  Your physician recommends that you schedule a follow-up appointment in: 1 year 2)  Your physician has requested that you have a carotid duplex. This test is an ultrasound of the carotid arteries in your neck. It looks at blood flow through these arteries that supply the brain with blood. Allow one hour for this exam. There are no restrictions or special instructions. 3)  PPD skin test place in left forearm   Immunizations Administered:  PPD Skin Test:    Vaccine Type: PPD    Site: left forearm    Mfr: Sanofi Pasteur    Dose: 0.1 ml    Route: sq    Given by: Teressa Lower RN    Exp. Date: 02/10/2011    Lot #: [id number]  PPD Results    Date of reading: 12/28/2009    Results: < 5mm    Interpretation: negative   Appended Document: F1Y PT LAST SEEN IN 2009    Clinical Lists Changes  Observations: Added new  observation of PAST MED HX: ASCVD: Stent to RCA and LAD at [facility] in 1994; neg. stress nuclear 12/98; 04/2005:40% LAD; 70% CX;      echocardiogram in 2009-normal EF; mild to moderate MR. Cerebrovascular disease: bilateral bruits; 7/08-50-69% right and left internal carotid artery stenosis;       /2011: 70-80% right; 50% left Hypertension Hyperlipidemia DEGENERATIVE JOINT DISEASE (ICD-715.90) Gastroesophageal reflux disease Colitis-2011 Carcinoma of the colon Dysphasia due to cervical web-dilated in 2002 Weight loss: 30 pounds between 2009-11    (01/03/2010 21:00) Added new observation of REFERRING MD: . (01/03/2010 21:00) Added new observation of PRIMARY MD: Dr. Dorthey Sawyer (01/03/2010 21:00) Added new observation of US CAROTID:   Findings:   RIGHT CAROTID ARTERY: At the level of the right carotid bulb, there is a combination of shadowing echogenic plaque and overlying hypoechoic plaque producing approximately 70-80% stenosis by gray scale criteria.  Turbulent surrounding colon is noted. Internalization of the right external carotid artery is noted, with approximately 80-90% stenosis at the level of the proximal external carotid artery due to shadowing echogenic plaque.  The dominant area of plaque burden is at the carotid bulb with minimal extension into the  right ICA.   RIGHT VERTEBRAL ARTERY:  Antegrade   LEFT CAROTID ARTERY: Shadowing echogenic plaque produces approximately 50 recess stenosis by gray scale criteria at the level of the carotid bulb.  Waveform morphologies are normal in the internal and external carotid arterial system.   LEFT VERTEBRAL ARTERY:  Antegrade   IMPRESSION:   Slight apparent progression of the right carotid bulb/proximal ICA stenosis now at approximately 70-80%.  Worsened right ECA ostial stenosis.   Approximately 50% stenosis at the left carotid bulb/proximal ICA, not significantly changed.   Read By:  Harrel Lemon,  MD   (01/01/2010  21:02)       Past History:  Past Medical History: ASCVD: Stent to RCA and LAD at [facility] in 1994; neg. stress nuclear 12/98; 04/2005:40% LAD; 70% CX;      echocardiogram in 2009-normal EF; mild to moderate MR. Cerebrovascular disease: bilateral bruits; 7/08-50-69% right and left internal carotid artery stenosis;       /2011: 70-80% right; 50% left Hypertension Hyperlipidemia DEGENERATIVE JOINT DISEASE (ICD-715.90) Gastroesophageal reflux disease Colitis-2011 Carcinoma of the colon Dysphasia due to cervical web-dilated in 2002 Weight loss: 30 pounds between 2009-11      Carotid Doppler  Procedure date:  01/01/2010  Findings:        Findings:   RIGHT CAROTID ARTERY: At the level of the right carotid bulb, there is a combination of shadowing echogenic plaque and overlying hypoechoic plaque producing approximately 70-80% stenosis by gray scale criteria.  Turbulent surrounding colon is noted. Internalization of the right external carotid artery is noted, with approximately 80-90% stenosis at the level of the proximal external carotid artery due to shadowing echogenic plaque.  The dominant area of plaque burden is at the carotid bulb with minimal extension into the right ICA.   RIGHT VERTEBRAL ARTERY:  Antegrade   LEFT CAROTID ARTERY: Shadowing echogenic plaque produces approximately 50 recess stenosis by gray scale criteria at the level of the carotid bulb.  Waveform morphologies are normal in the internal and external carotid arterial system.   LEFT VERTEBRAL ARTERY:  Antegrade   IMPRESSION:   Slight apparent progression of the right carotid bulb/proximal ICA stenosis now at approximately 70-80%.  Worsened right ECA ostial stenosis.   Approximately 50% stenosis at the left carotid bulb/proximal ICA, not significantly changed.   Read By:  Harrel Lemon,  MD

## 2010-05-11 NOTE — Miscellaneous (Signed)
  Clinical Lists Changes  Medications: Added new medication of BISOPROLOL-HYDROCHLOROTHIAZIDE 5-6.25 MG TABS (BISOPROLOL-HYDROCHLOROTHIAZIDE) Take 1 tablet by mouth once a day - Signed Rx of BISOPROLOL-HYDROCHLOROTHIAZIDE 5-6.25 MG TABS (BISOPROLOL-HYDROCHLOROTHIAZIDE) Take 1 tablet by mouth once a day;  #90 x 4;  Signed;  Entered by: Teressa Lower RN;  Authorized by: Kathlen Brunswick, MD, [facility];  Method used: Electronically to [facility]*, , ,   , Ph: [phone], Fax: [phone]    Prescriptions: BISOPROLOL-HYDROCHLOROTHIAZIDE 5-6.25 MG TABS (BISOPROLOL-HYDROCHLOROTHIAZIDE) Take 1 tablet by mouth once a day  #90 x 4   Entered by:   Teressa Lower RN   Authorized by:   Kathlen Brunswick, MD, [facility]   Signed by:   Teressa Lower RN on 10/29/2008   Method used:   Electronically to        [facility]* (mail-order)             ,          Ph: [phone]       Fax: [phone]   RxID:   [id number]

## 2010-05-11 NOTE — Progress Notes (Signed)
Summary: test results   Phone Note Call from Patient Call back at Home Phone [phone]   Caller: pt Reason for Call: Lab or Test Results Summary of Call: test results Initial call taken by: Faythe Ghee,  January 14, 2010 10:01 AM  Follow-up for Phone Call        [facility] Follow-up by: Teressa Lower RN,  January 14, 2010 10:59 AM  Additional Follow-up for Phone Call Additional follow up Details #1::        carotid US results given to pt, verbalized undrstanding Additional Follow-up by: Teressa Lower RN,  January 15, 2010 1:45 PM

## 2010-06-27 LAB — DIFFERENTIAL
Basophils Relative: 0 % (ref 0–1)
Eosinophils Relative: 0 % (ref 0–5)
Lymphocytes Relative: 5 % — ABNORMAL LOW (ref 12–46)
Monocytes Absolute: 0.4 10*3/uL (ref 0.1–1.0)
Monocytes Relative: 3 % (ref 3–12)
Neutro Abs: 13.3 10*3/uL — ABNORMAL HIGH (ref 1.7–7.7)

## 2010-06-27 LAB — URINALYSIS, ROUTINE W REFLEX MICROSCOPIC
Nitrite: NEGATIVE
Protein, ur: NEGATIVE mg/dL
Specific Gravity, Urine: 1.02 (ref 1.005–1.030)
Urobilinogen, UA: 0.2 mg/dL (ref 0.0–1.0)

## 2010-06-27 LAB — COMPREHENSIVE METABOLIC PANEL
AST: 26 U/L (ref 0–37)
Albumin: 3.8 g/dL (ref 3.5–5.2)
Alkaline Phosphatase: 80 U/L (ref 39–117)
BUN: 31 mg/dL — ABNORMAL HIGH (ref 6–23)
GFR calc Af Amer: 56 mL/min — ABNORMAL LOW (ref >60)
Potassium: 4.5 mEq/L (ref 3.5–5.1)
Sodium: 136 mEq/L (ref 135–145)
Total Protein: 7.3 g/dL (ref 6.0–8.3)

## 2010-06-27 LAB — CBC
MCV: 94.8 fL (ref 78.0–100.0)
Platelets: 223 10*3/uL (ref 150–400)
RBC: 3.8 MIL/uL — ABNORMAL LOW (ref 3.87–5.11)
RDW: 13.5 % (ref 11.5–15.5)
WBC: 14.5 10*3/uL — ABNORMAL HIGH (ref 4.0–10.5)

## 2010-06-27 LAB — LACTIC ACID, PLASMA: Lactic Acid, Venous: 1.5 mmol/L (ref 0.5–2.2)

## 2010-06-27 LAB — POCT CARDIAC MARKERS
CKMB, poc: 2.1 ng/mL (ref 1.0–8.0)
Myoglobin, poc: 182 ng/mL (ref 12–200)

## 2010-06-27 LAB — CREATININE, SERUM
Creatinine, Ser: 0.79 mg/dL (ref 0.4–1.2)
GFR calc Af Amer: >60 mL/min (ref >60)

## 2010-07-19 LAB — URINALYSIS, ROUTINE W REFLEX MICROSCOPIC
Glucose, UA: NEGATIVE mg/dL
Hgb urine dipstick: NEGATIVE
Ketones, ur: NEGATIVE mg/dL
Protein, ur: NEGATIVE mg/dL

## 2010-07-19 LAB — DIFFERENTIAL
Basophils Absolute: 0.1 10*3/uL (ref 0.0–0.1)
Lymphocytes Relative: 11 % — ABNORMAL LOW (ref 12–46)
Monocytes Absolute: 0.9 10*3/uL (ref 0.1–1.0)
Monocytes Relative: 10 % (ref 3–12)
Neutro Abs: 7.5 10*3/uL (ref 1.7–7.7)

## 2010-07-19 LAB — CBC
HCT: 39.4 % (ref 36.0–46.0)
MCHC: 35 g/dL (ref 30.0–36.0)
MCV: 96.6 fL (ref 78.0–100.0)
Platelets: 166 10*3/uL (ref 150–400)
RDW: 13.3 % (ref 11.5–15.5)

## 2010-07-19 LAB — COMPREHENSIVE METABOLIC PANEL
Albumin: 4.1 g/dL (ref 3.5–5.2)
BUN: 23 mg/dL (ref 6–23)
Calcium: 9.8 mg/dL (ref 8.4–10.5)
Creatinine, Ser: 1.29 mg/dL — ABNORMAL HIGH (ref 0.4–1.2)
Total Bilirubin: 0.9 mg/dL (ref 0.3–1.2)
Total Protein: 7.9 g/dL (ref 6.0–8.3)

## 2010-08-24 NOTE — Letter (Signed)
July 05, 2007    Patrica Duel, M.D.  [address]  [place], [place] [address]   RE:  Kayla, Lane  MRN:  [id number]  /  DOB:  April 11, 1926   Kayla Lane,   It was my pleasure reevaluating Kayla Lane in Kayla office today at your  request.  As you know, I follow this nice woman for coronary disease,  cerebrovascular disease and cardiovascular risk factors.  She was  recently noted to have wheezing in Kayla office and cough.  This has  resolved with initiation of treatment with a PPI.  She feels quite well  at Kayla present time and has began working outside in her garden.  Chest  x-ray showed cardiomegaly.  She has a known murmur and was referred for  reassessment of mitral regurgitation.  She denies all cardiopulmonary  symptoms.   CURRENT MEDICATIONS:  1. Cozaar 50 mg daily.  2. Simvastatin 40 mg daily.  3. Protonix 40 mg daily.  4. Detrol 4 mg daily.  5. Aspirin 81 mg daily.  6. Ziac 5/6.25 mg daily.  7. Tylenol arthritis p.r.n.   LABORATORY STUDIES:  Excellent in July of last year.   PHYSICAL EXAMINATION:  A  pleasant woman in no acute distress.  Kayla weight is 134, 5 pounds more than in July of last year.  Blood  pressure 125/80, heart rate 68 and regular, respirations 18.  Neck:  No jugular venous distention; prominent carotid bruit on Kayla  right with minimal bruit on Kayla left.  LUNGS:  Mild to moderate kyphosis; clear lung fields.  CARDIAC:  Normal first and second heart sounds; grade 1-2/6 basilar  systolic ejection murmur; no apical murmur appreciated.  ABDOMEN:  Soft and nontender; no organomegaly.  EXTREMITIES:  No edema; distal pulses intact; prominent hematoma over  Kayla right lower leg, where Kayla Lane struck herself in a fall.  She  describes losing balance, not losing consciousness.   IMPRESSION:  Kayla Lane appears to be doing quite well with respect to  coronary and cerebrovascular disease.  Carotid ultrasound in July of  last year showed stable  50-69% bilateral internal carotid artery  stenoses.  She has no cardiac symptoms.  Her murmur is an aortic  sclerosis murmur.  She did have mitral regurgitation on previous echo,  but this is not apparent by exam.  We will repeat her echocardiogram to  verify that mitral regurgitation is insignificant and that she has no  progressive cardiac enlargement.  If results are good, I will plan see  this nice woman again in 1 year.    Sincerely,      Gerrit Friends. Dietrich Pates, MD, [facility]  Electronically Signed    RMR/MedQ  DD: 07/05/2007  DT: 07/06/2007  Job #: [id number]

## 2010-08-24 NOTE — Letter (Signed)
October 20, 2006    Kayla Lane, M.D.  [address]  [place], [place] [address]   RE:  Kayla Lane, Kayla Lane  MRN:  [id number]  /  DOB:  1925/09/08   Dear Kayla Lane:   [name] returns to the office for continued assessment and treatment  of coronary and cerebrovascular disease with multiple vascular risk  factors.  Since her last visit, she had done superbly.  She is active in  her garden without any cardiopulmonary symptoms.  She has noted weight  loss, but says this commonly occurs to her in the summer when she is  more active.  Blood pressure control has, apparently, been good.  She is  unaware of her recent lipid profile; the last one in my records is from  18 months ago.  Vaccinations are up to date.  She did not bring a list  of her medications, and cannot tell me what she is taking.   PHYSICAL EXAMINATION:  A pleasant, proportionate woman in no acute  distress.  The weight is 129, 14 pounds less than in June 2007.  Blood pressure  105/70, heart rate is 64 and regular, respirations 18.  NECK:  No jugular venous distension, normal carotid upstrokes without  bruits.  CHEST:  Mild-to-moderate kyphosis, clear lung fields.  CARDIAC:  Normal 1st and 2nd heart sounds, 4th heart sound prominent.  ABDOMEN:  Soft and nontender, no bruits, aortic pulsation not palpable.  EXTREMITIES:  Erythema of the skin over the legs, surface varicosities,  minimal edema.  Distal pulses intact.   Carotid ultrasound performed four days ago showed no progression of  disease.  She has 50% to 69% bilateral internal coronary artery  stenoses.   IMPRESSION:  [name] is doing generally well.  I am somewhat  concerned about a 14-pound weight loss in an older woman.  Basic  laboratory studies will be obtained.  If results are good, we plan to  see this nice woman again in 1 year.    Sincerely,      Gerrit Friends. Dietrich Pates, MD, [facility]  Electronically Signed    RMR/MedQ  DD: 10/20/2006  DT:  10/21/2006  Job #: [id number]

## 2010-08-24 NOTE — Consult Note (Signed)
NAME:  Kayla Lane, SLITER               ACCOUNT NO.:  [id number]   MEDICAL RECORD NO.:  [id number]          PATIENT TYPE:  AMB   LOCATION:  DAY                           FACILITY:  [facility]   PHYSICIAN:  R. Roetta Sessions, M.D. DATE OF BIRTH:  October 26, 1925   DATE OF CONSULTATION:  DATE OF DISCHARGE:                                 CONSULTATION   REASON FOR CONSULTATION:  Difficulty swallowing.   REQUESTING PHYSICIAN:  Patrica Duel, M.D.   HISTORY OF PRESENT ILLNESS:  Kayla Lane is here for further evaluation  of recurrent dysphagia.  She saw Dr. Patrica Duel recently with  complaints of a hacking cough for a couple of months and burning  substernally as well as dysphagia.  She had a chest x-ray which revealed  some COPD change and cardiomegaly.  She is going to see a cardiologist  in the near future.  She states that over the past couple of months, she  has had some retrosternal burning.  She also has epigastric burning as  well.  She had some heartburn-type symptoms.  These have improved since  she was switched from omeprazole to Protonix.  She has had a couple of  episodes where when she swallowed food, it seemed to get lodged, and she  could not wash it down with liquid.  She states that over the past week  or two, she has actually felt better; however, she also has chronic  constipation controlled with MiraLax.  No blood in the stools.  No  melena.  No weight loss.   CURRENT MEDICATIONS:  1. Ziac 6.25 mg daily.  2. Detrol 4 mg daily.  3. Zocor 40 mg daily.  4. Aspirin 81 mg daily.  5. Protonix 40 mg daily.  6. Cozaar 50 mg daily.  7. Tylenol Arthritis p.r.n.  8. MiraLax p.r.n.   ALLERGIES:  PENICILLIN, TETRACYCLINE, SULFA, DYES.   PAST MEDICAL HISTORY:  1. History of remote peptic ulcer disease.  2. GERD.  3. Dysphagia with cervical esophageal web, status post dilation in      2005.  She had a 58 [place] Maloney dilator passed at that time.  4. History of coronary artery  disease, status post two stents.  5. History of IB colon cancer, status post resection of the left colon      in 1996.  The last colonoscopy was in November, 2007 and was      normal, status post left hemicolectomy, except for a few distal      sigmoid diverticula.  6. History of heart murmur, hysterectomy, bladder tacking,      appendectomy, back surgery, tonsillectomy.   FAMILY HISTORY:  No history of chronic GI or liver disease, colorectal  cancer.   SOCIAL HISTORY:  She is widowed.  She lives alone.  She has two grown  children.  She is a retired Nature conservation officer.  No  tobacco or alcohol use.   REVIEW OF SYSTEMS:  See HPI for GI.  No weight loss.  CARDIOPULMONARY:  See HPI.  GENITOURINARY:  No dysuria or hematuria.   PHYSICAL EXAMINATION:  Weight 136, stable.  Height 5 foot 5.  Temp 97.7,  blood pressure 118/70, pulse 60.  GENERAL:  A pleasant elderly Caucasian female in no acute distress.  SKIN:  Warm and dry.  No jaundice.  HEENT:  Sclerae are anicteric.  Oropharyngeal mucosa and pink.  No  lesions, erythema, or exudate.  CHEST:  Lungs are clear to auscultation.  CARDIAC:  Regular rate and rhythm.  No murmurs, rubs or gallops.  ABDOMEN:  Positive bowel sounds.  Abdomen is soft with multiple scars.  Nontender.  No organomegaly or masses.  LOWER EXTREMITIES:  No edema.   IMPRESSION:  Kayla Lane is a 77 year old lady with a history of chronic  gastroesophageal reflux disease who presents with somewhat refractory  symptoms recently as well as some dysphagia to solid foods.  She has had  at least two episodes of what sounds like near-complete food impaction.  She has a history of cervical web which was stretched back in 2005.  Recently switched from omeprazole to Protonix with better improvement of  her typical gastroesophageal reflux disease symptoms.   PLAN:  1. EGD with Dr. Kendell Bane in the near future.  We will hold her aspirin      for five days.  2.  Her last colonoscopy was in November, 2007.  We will discuss with      Dr. Kendell Bane with regards as to when her next colonoscopy will be      due, either in three or five years.  3. Prescription for Protonix 40 mg p.o. daily, #31 with 3 refills.   I would like to thank Dr. Patrica Duel for allowing [place] to take part in  the care of this patient.      Tana Coast, P.AJonathon Bellows, M.D.  Electronically Signed    LL/MEDQ  D:  06/14/2007  T:  06/14/2007  Job:  [id number]   cc:   Patrica Duel, M.D.  Fax: [phone]

## 2010-08-24 NOTE — Op Note (Signed)
NAME:  Kayla Lane, Kayla Lane               ACCOUNT NO.:  [id number]   MEDICAL RECORD NO.:  [id number]          PATIENT TYPE:  AMB   LOCATION:  DAY                           FACILITY:  [facility]   PHYSICIAN:  R. Roetta Sessions, M.D. DATE OF BIRTH:  02-Apr-1926   DATE OF PROCEDURE:  06/22/2007  DATE OF DISCHARGE:                               OPERATIVE REPORT   PROCEDURE:  Esophagogastroduodenoscopy with Elease Hashimoto dilation.   INDICATIONS FOR PROCEDURE:  A 77 year old lady with recurrent esophageal  dysphagia to silence.  She has a history of cervical web requiring  dilation previously.  EGD is now being done for esophageal dilation as  appropriate.  Risks, benefits, alternatives, and limitations were  reviewed with [name] previously and again have been stated at the  bedside.  Please see documentation on the medical record.  Questions  were answered.  She is agreeable.   PROCEDURE NOTE:  O2 saturation, blood pressure, pulses, and respirations  were monitored throughout the entire procedure.  Conscious sedation with  Versed 3 mg IV and Demerol 50 mg IV in divided doses.   INSTRUMENT:  Pentax video chip system.   Cetacaine spray for topical pharyngeal anesthesia.   FINDINGS:  Examination of the tubular esophagus showed a questionable,  noncritical cervical web and a noncritical-appearing Schatzki's ring.  Intervening esophageal mucosa appeared normal.  The EG junction was  easily traversed.   STOMACH:  Gastric cavity was empty and insufflated well with air.  A  thorough examination of the gastric mucosa, including a retroflexed view  of the proximal stomach and the esophagogastric junction demonstrated  only a small hiatal hernia.  The pylorus was patent and easily  transversed.  Examination of the bulb and second portion revealed no  abnormalities.   THERAPEUTICS/DIAGNOSTIC MANEUVERS PERFORMED:  The scope was withdrawn.  A 56 [place] Maloney dilator was passed to full insertion.  A look  back  revealed both the web and ring had been ruptured without apparent  complications.  There was minimal bleeding.  The patient tolerated the  procedure very well and was reactive.   ENDOSCOPY IMPRESSION:  Cervical esophageal web, noncritical-appearing  Schatzki's ring, status post dilation and disruption as described above,  otherwise normal esophagus.  A small hiatal hernia, otherwise normal  stomach, D1, D2.   RECOMMENDATIONS:  Continue Protonix 40 mg orally daily indefinitely.  [name] is to call if she has any future difficulties.      Jonathon Bellows, M.D.  Electronically Signed     RMR/MEDQ  D:  06/22/2007  T:  06/22/2007  Job:  [id number]   cc:   Patrica Duel, M.D.  Fax: [phone]

## 2010-08-27 NOTE — Op Note (Signed)
NAME:  Kayla Lane, Kayla Lane               ACCOUNT NO.:  [id number]   MEDICAL RECORD NO.:  [id number]          PATIENT TYPE:  AMB   LOCATION:  DAY                           FACILITY:  [facility]   PHYSICIAN:  R. Roetta Sessions, M.D. DATE OF BIRTH:  03-12-1926   DATE OF PROCEDURE:  03/23/2004  DATE OF DISCHARGE:                                 OPERATIVE REPORT   PROCEDURE:  Esophagogastroduodenoscopy with Elease Hashimoto dilation.   INDICATION FOR PROCEDURE:  The patient is a 77 year old lady with recurrent  esophageal dysphagia to solids.  She has a history of a cervical esophageal  web.  She underwent dilation with a 56 Maloney dilator by me back in 2002.  She has done well until recently, reflux symptoms well-controlled on  Prevacid 30 mg orally daily.  EGD is now being done.  This approach has been  discussed with the patient.  The potential risks, benefits, and alternatives  have been reviewed, questions answered, she is agreeable.  Please see  documentation in the medical record.   PROCEDURE NOTE:  O2 saturation, blood pressure, pulse, and respiration were  monitored throughout the entire procedure.   CONSCIOUS SEDATION:  Versed 2 mg IV, Demerol 50 mg IV, Cetacaine spray for  topical oropharyngeal anesthesia.   SUBACUTE BACTERIAL ENDOCARDITIS PROPHYLAXIS:  Vancomycin 1 g IV, gentamicin  100 mg IV prior to procedure.   FINDINGS:  Examination of the tubular esophagus again revealed focal  narrowing in the area of the cervical esophagus.  A discrete lesion was not  seen, however.  The mucosa otherwise appeared normal all the way down to the  EG junction.  The EG junction was easily traversed.   Stomach:  The gastric cavity was empty and insufflated well with air.  A  thorough examination of the gastric mucosa, including retroflexed view of  the proximal stomach, esophagogastric junction, demonstrated no  abnormalities.  Pylorus patent and easily traversed.  Examination of the  bulb and second  portion revealed no abnormalities.   Therapeutic/diagnostic maneuvers performed:  A 56 Maloney dilator was passed  to full insertion with a slight amount of blood return on the dilator.  A  look back revealed a slight tear at the mucosa of the UES.  Otherwise no  apparent complication related to the passage of the dilator.  The patient  tolerated the procedure well, was reacted in endoscopy.   IMPRESSION:  Probable cervical esophageal web (recurrent), status post  dilation described above, otherwise normal esophageal mucosa.  Normal  stomach, D1, D2.   RECOMMENDATIONS:  Continue Prevacid 30 mg orally daily.  Gradually resume  regular diet later today.  She is to call if she has any future problems.     Otelia Sergeant   RMR/MEDQ  D:  03/23/2004  T:  03/23/2004  Job:  [id number]   cc:   Kirk Ruths, M.D.  [address]  [place]  [place] [address]  Fax: [phone]

## 2010-08-27 NOTE — Op Note (Signed)
NAME:  Kayla Lane, Kayla Lane               ACCOUNT NO.:  [id number]   MEDICAL RECORD NO.:  [id number]          PATIENT TYPE:  AMB   LOCATION:  DAY                           FACILITY:  [facility]   PHYSICIAN:  R. Roetta Sessions, M.D. DATE OF BIRTH:  02-28-1926   DATE OF PROCEDURE:  03/07/2006  DATE OF DISCHARGE:                                PROCEDURE NOTE   PROCEDURE:  Diagnostic colonoscopy.   ENDOSCOPIST:  Jonathon Bellows, M.D.   INDICATIONS FOR PROCEDURE:  The patient is a 77 year old lady, status  post left hemi-colectomy for B1 colon cancer several years ago.  Last  colonoscopy in 2004, looked good.  She has had right lower quadrant  abdominal pain for some time now.  May be exacerbated with position.  Not effected by eating or having a bowel movement.  There is no  radiating component.  A colonoscopy is now being done to evaluate her  residual colon spread.  It has been discussed with the patient at length  the potential risks, benefits and alternatives which have been reviewed.  She had an outpatient CT at [facility] which demonstrated no  abnormalities in the right lower quadrant.  She is status post  appendectomy in the distant past.  This has been discussed with the  patient at length.  The potential risks, benefits and alternatives have  been reviewed.  Questions answered and she is agreeable.   DESCRIPTION OF PROCEDURE/FINDINGS:  The patient is placed in the left  lateral decubitus position.  Her O2 saturation, blood pressure and pulse  were closely monitored throughout the entire procedure.  Conscious  sedation with Versed 3 mg IV and Demerol 50 mg IV in divided doses.  The  instrument is the Olympus video-chip system.   The findings revealed on a digital rectal exam no abnormalities.  The  endoscopic findings revealed that the prep was good.   RECTUM:  Examination of the rectal mucosa including a retroflexed view  of the anal verge revealed no abnormalities.   COLON:   The colonic mucosa was surveyed from the rectosigmoid junction  through the left transverse and right colon to the ileocecal valve and  cecum.  These structures were well-seen and photographed for the record.  I attempted to intubate the terminal ileum.  I was unable to do so.  The  videoscope was slowly withdrawn and all previously-mentioned mucosal  surfaces were again seen.  She had a couple of scattered distal colonic  kinks.  The surgical anastomosis at 30 cm appeared normal, as did the  remainder of the colonic mucosa.   The patient tolerated the procedure well and was reactive in endoscopy.   IMPRESSION:  Normal rectum, status post left hemi-colectomy.  A few  distal sigmoid diverticula.  The remainder of the residual colon appeared normal.  I suspect that the  patient may well have adhesive disease.  I doubt this is radicular pain.  The findings on the recent CT are reassuring.   RECOMMENDATIONS:  Will go ahead and get her an elective appointment to  see Dr. Lovell Sheehan, to get his  opinion regarding right lower quadrant  abdominal pain, query adhesive disease.      Jonathon Bellows, M.D.  Electronically Signed     RMR/MEDQ  D:  03/07/2006  T:  03/07/2006  Job:  [id number]   cc:   Patrica Duel, M.D.  Fax: [phone]

## 2010-08-27 NOTE — Op Note (Signed)
NAME:  Kayla Lane, Kayla Lane                         ACCOUNT NO.:  [id number]   MEDICAL RECORD NO.:  [id number]                   PATIENT TYPE:  AMB   LOCATION:  DAY                                  FACILITY:  [facility]   PHYSICIAN:  R. Roetta Sessions, M.D.              DATE OF BIRTH:  08-23-1925   DATE OF PROCEDURE:  07/03/2003  DATE OF DISCHARGE:                                 OPERATIVE REPORT   PROCEDURE:  Surveillance colonoscopy.   ENDOSCOPIST:  Gerrit Friends. Rourk, M.D.   INDICATIONS FOR PROCEDURE:  The patient is a 77 year old lady with a history  of B-1 colon cancer, status post low anterior resection 1996; negative  colonoscopy in 2000.  She is devoid of any lower GI tract symptoms. She is  here for surveillance.  This approach has been discussed with the patient at length.  The potential  risks, benefits, and alternatives have been reviewed; questions answered.  Please see my dictated H&P.   PROCEDURE NOTE:  O2 saturation, blood pressure, pulse and respirations were  monitored throughout the entire procedure.  Conscious sedation: Versed 6 mg  IV, Demerol 75 mg IV in divided doses.   INSTRUMENT:  Olympus pediatric colonoscope and adult colonoscope.   FINDINGS:  Digital rectal exam revealed no abnormalities; initially I was  given the pediatric colonoscope.   ENDOSCOPIC FINDINGS:  The prep was good.   RECTUM:  Examination of the rectal mucosa including the retroflex view of  the anal verge revealed no abnormalities.   COLON:  The colonic mucosa was surveyed from the rectosigmoid junction  through the left transverse and right colon to the area of the appendiceal  orifice, ileocecal valve, and cecum.  The anastomosis was identified at  approximately 35 cm.  It appeared normal, as did the remainder of the  colonic mucosa to the ileocecal valve.  The colon was elongated and  capacious.  I was unable to intubate the cecum after multiple attempts with  the pediatric scope, changing of  the patient's position and external  abdominal pressure.  I elected to withdraw the pediatric colonoscope and  obtain an adult scope.  I reintubated the rectum and easily advanced the  scope to the right colon; however, I still ran into redundancy and recurrent  looping thwarting my efforts to get to the cecum.  Again utilizing a  combination of external abdominal pressure and multiple different patient  positions, I was unable to intubate the cecum although it was seen  partially; I consider this an inadequate survey.   From the level of the ileocecal valve the scope was slowly withdrawn.  All  previously mentioned mucosal surfaces were again seen; again, the colonic  mucosa seen appeared entirely normal, normal colonic anastomosis.  The  patient tolerated the procedure well.   IMPRESSION:  1. Normal rectum.  2. Status post left hemicolectomy.  3. Long capacious but otherwise normal appearing colonic  mucosa to the     ileocecal valve.  4. Cecum inadequately seen.   RECOMMENDATIONS:  1. Air contrast barium enema next week to image cecum.  2. Further recommendations to follow.      ___________________________________________                                            Jonathon Bellows, M.D.   RMR/MEDQ  D:  07/03/2003  T:  07/03/2003  Job:  [id number]   cc:   Kirk Ruths, M.D.  [address]  [place]  [place] [address]  Fax: [phone]   R. Roetta Sessions, M.D.  [address]  [place]  [place] [address]  Fax: [phone]

## 2010-08-27 NOTE — Consult Note (Signed)
Kayla Lane, Kayla Lane NO.:  [id number]   MEDICAL RECORD NO.:  [id number]           PATIENT TYPE:   LOCATION:                                 FACILITY:   PHYSICIAN:  Theresia Majors. Tanda Rockers, M.D.DATE OF BIRTH:  09-Apr-1926   DATE OF CONSULTATION:  09/24/2005  DATE OF DISCHARGE:                                   CONSULTATION   REASON FOR CONSULTATION:  Kayla Lane is a 77 year old lady referred by Dr.  Sherwood Gambler from [facility] in [place] for evaluation of her  right leg cellulitis and injuries.   IMPRESSION:  Stasis cellulitis and ulceration secondary to trauma.   RECOMMENDATIONS:  Continuation of the antibiotics that have been initiated  by Dr. Sherwood Gambler and in addition initiate compression therapy to decrease and  control venous hypertension and facilitate healing.   SUBJECTIVE:  Kayla Lane is a 77 year old female who sustained injuries to  her right shins approximately 10 days ago. She treated the wounds initially  with local cleansing and Band-Aids but over the next 3 days she experienced  swelling, pain and warmth. She was subsequently seen by Dr. Sherwood Gambler who  started her on clindamycin 300 mg q.i.d. and Levaquin 500 mg daily and she  had response with decreasing in the pain and redness. She has continued to  have some minimum drainage, the wounds have progressed to developing thick  darkened eschar.   PAST MEDICAL HISTORY:  Remarkable for coronary artery disease. She had two  stents placed approximately 15 years ago at [facility]. Her most recent  cardiac cath in January 2007 was noncritical. In addition, she has had a  colon resection for cancer in 1996 and she did not have chemotherapy, did  not have radiation therapy and her most recent colonoscopy was normal as of  2005.   CURRENT MEDICATION LIST:  1.  Prevacid 30 mg p.o. daily.  2.  Ziac 5 mg daily.  3.  Aspirin 81 mg.  4.  Pravachol 40 mg daily.  5.  Detrol 4 mg daily.  6.  Potassium  20 mEq daily.  7.  Furosemide 20 mg t.i.d.   FAMILY HISTORY:  Positive for hypertension, stroke and cancer.   SOCIAL HISTORY:  She lives in [place], she is married. She is retired.   PHYSICAL EXAM:  GENERAL:  She is alert, pleasant, oriented in good contact  with reality.  HEENT:  Exam is clear.  NECK:  Supple. Carotid bruits are not appreciated.  HEART:  The heart sounds are normal.  ABDOMEN:  The abdomen is soft.  EXTREMITIES:  The femoral pulses are 3+, her pedal pulses are 3+. There are  bilateral changes of stasis with +2 edema on the right, +1 on the left.  There are prominent varicosities and spider angiomata bilaterally. On the  anterior shins, there are two separate areas of denuded injury. There are  firm eschars in place. There is minimum drainage. There is a small halo of  erythema associated with the wounds.  NEUROLOGIC:  Protective sensation is preserved.   DISCUSSION:  This patient's injuries are  healing as of today's exam.  However, she does have stasis changes in right lower extremity which will  compromise complete epithelialization of the wounds. Toward these ends, we  are adding compression therapy to her management while retaining her  antibiotic coverage. We anticipate that the compression therapy will prevent  the premature slough of the healthy eschar and will result in a more rapid  and uncomplicated healing. We have explained this approach to the patient in  terms that she seems to understand. She expresses a desire to proceed with  the compression therapy and expresses gratitude for having been seen in the  clinic.           ______________________________  Theresia Majors. Tanda Rockers, M.D.     Kayla Lane  D:  06/24/2005  T:  06/26/2005  Job:  [id number]   cc:   Madelin Rear. Sherwood Gambler, MD  Fax: [phone]

## 2010-08-27 NOTE — Op Note (Signed)
NAME:  Kayla Lane, Kayla Lane                         ACCOUNT NO.:  [id number]   MEDICAL RECORD NO.:  [id number]                   PATIENT TYPE:  OIB   LOCATION:  5013                                 FACILITY:  [facility]   PHYSICIAN:  Sharolyn Douglas, M.D.                     DATE OF BIRTH:  [birth year]   DATE OF PROCEDURE:  04/10/2002  DATE OF DISCHARGE:  04/11/2002                                 OPERATIVE REPORT   PREOPERATIVE DIAGNOSIS:  Osteoporotic T12 compression fracture with  intractable pain.   POSTOPERATIVE DIAGNOSIS:  Osteoporotic T12 compression fracture with  intractable pain.   PROCEDURE:  1. Closed reduction of T12 fracture.  2. Deep bone biopsy T12.  3. Percutaneous vertebroplasty T12.  4. Radiographic interpretation of fluoroscopic images used for     vertebroplasty.   SURGEON:  Sharolyn Douglas, M.D.   ASSISTANT:  Verlin Fester, P.A.   ANESTHESIA:  General endotracheal.   COMPLICATIONS:  None.   INDICATIONS:  The patient is a 77 year old female with intractable  thoracolumbar pain.  Her plain x-rays and MRI scan show a superior end plate  fracture T12 with edema suggesting an acute injury.  Her pain was  unresponsive to medications including narcotics.  Risks, benefits and  alternatives of kyphoplasty to partially restore her deformity, prevent  further collapse and treat her pain were discussed in detail.  The patient  elected to proceed.   DESCRIPTION OF PROCEDURE:  The patient was properly identified in the  holding area and taken to the operating room.  She underwent general  endotracheal anesthesia without difficulty. She was given prophylactic IV  antibiotics.  She was carefully turned prone onto the [place] table.  Bolsters were used to support her shoulders and iliac crest.  Her legs were  hyperextended affecting a postural reduction of the T12 fracture.  We used  the lateral fluoroscopy to confirm the a partial reduction.  The back was  then prepped and draped in  the usual sterile fashion.  Biplanar fluoroscopy  was brought into the field.  We obtained true AP and lateral fluoroscopic  images of T12.  We then used the Jamshidi needle to cannulate the pedicle of  T12 bilaterally.  Guidewires were placed through the Jamshidi needles, and  then a working cannula established.  A biopsy was taken using the trocar.  This specimen was sent to pathology for routine examination.  We then placed  intravertebral bone tamps bilaterally through the working cannulas.  Each  balloon was inflated to 5 cc which corresponded to a pressure of 100 PCI.  This was observed under direct fluoroscopic biplanar imaging.  We noted a  partial restoration of the collapse of the superior end plate.  We then  pushed partially hardened methyl methacrylate cement through the working  cannulas that had been mixed with barium.  We observed this under direct AP  and  lateral fluoroscopic imaging.  A total of 8 cc of cement was utilized.  The working cannulas were removed.  The two small stab incisions were closed  with 3-0 interrupted nylon sutures.  Sterile dressing was applied.  The  patient was turned supine, extubated without difficulty and transferred to  the recovery room able to move her upper and lower extremities.                                               Sharolyn Douglas, M.D.    MC/MEDQ  D:  04/10/2002  T:  04/10/2002  Job:  [id number]

## 2010-08-27 NOTE — Procedures (Signed)
Kayla, Lane NO.:  [id number]   MEDICAL RECORD NO.:  [id number]          PATIENT TYPE:  OUT   LOCATION:  RAD                           FACILITY:  [facility]   PHYSICIAN:  Vida Roller, M.D.   DATE OF BIRTH:  30-May-1925   DATE OF PROCEDURE:  03/01/2004  DATE OF DISCHARGE:                                  ECHOCARDIOGRAM   PRIMARY CARE PHYSICIAN:  Patrica Duel, M.D.   REASON FOR CONSULTATION:  This is a 77 year old woman with a murmur.  No  previous echos.   TECHNICAL QUALITY:  Adequate.   M-MODE TRACINGS:  1.  The aorta is 26 mm.  2.  The left atrium is 38 mm.  3.  The septum is 12 mm.  4.  The posterior wall is 12 mm.  5.  The left ventricular diastolic dimension is 39 mm.  6.  The left ventricular systolic dimension is 28 mm.   2-D AND DOPPLER IMAGING:  1.  The left ventricle is normal size with normal systolic function.  There      is mild concentric left ventricular hypertrophy.  Ejection fraction is      estimated at 55-60%.  No obvious wall motion abnormalities are seen.  2.  The right ventricle is normal size with normal systolic function.  3.  Both atria appear to be top normal in size.  4.  The aortic valve is sclerotic with trivial insufficiency.  No stenosis      is seen.  5.  The mitral valve has some mild thickening with mild-to-moderate      regurgitation.  6.  The tricuspid valve has mild-to-moderate regurgitation.  No stenosis is      seen.  7.  Pulmonic valve not well seen.  8.  No pericardial effusion.  9.  Inferior vena cava appears to be normal size.  10. Ascending aorta not well seen.     Trey Paula   JH/MEDQ  D:  03/01/2004  T:  03/01/2004  Job:  [id number]

## 2010-08-27 NOTE — Procedures (Signed)
NAME:  Kayla Lane, Kayla Lane NO.:  [id number]   MEDICAL RECORD NO.:  [id number]          PATIENT TYPE:  REC   LOCATION:                                FACILITY:  [facility]   PHYSICIAN:  Vida Roller, M.D.   DATE OF BIRTH:  [birth year]   DATE OF PROCEDURE:  03/25/2005  DATE OF DISCHARGE:                                    STRESS TEST   HISTORY:  [name] is a 77 year old female with known coronary disease,  status post stent to her RCA and LAD in 1994.  Cardiolite in 1998, with  negative for ischemia.  The patient now with complains of dyspnea on  exertion.   BASELINE DATA:  Electrocardiogram revealed 67 beats per minute, blood  pressure of 128/80.   The patient exercised for 5 minutes and 43 seconds to Bruce protocol stage  III and 7.0 METS.  Maximal heart rate was 124 beats per minute which is 87%  predicted.  Maximum blood pressure is 188/72 and resolved down to 158/78 in  recovery.  Electrocardiogram revealed few PACs.  She did have some ST  depression in inferolateral leads that resolved in recovery.  The patient  also reported dyspnea and chest tightness with exercise that resolved in  recovery.   Final images and results are pending MD review.      Jae Dire, P.A. [facility]      Vida Roller, M.D.  Electronically Signed    AB/MEDQ  D:  03/25/2005  T:  03/25/2005  Job:  [id number]

## 2010-08-27 NOTE — Consult Note (Signed)
NAME:  Kayla Lane, Kayla Lane               ACCOUNT NO.:  [id number]   MEDICAL RECORD NO.:  [id number]           PATIENT TYPE:   LOCATION:                                FACILITY:  [facility]   PHYSICIAN:  R. Roetta Sessions, M.D. DATE OF BIRTH:  10-Jun-1925   DATE OF CONSULTATION:  03/23/2004  DATE OF DISCHARGE:                                   CONSULTATION   CHIEF COMPLAINT:  Follow-up dysphagia.   HISTORY OF PRESENT ILLNESS:  Ms. Hyder is a 77 year old Caucasian female  patient of Dr. Jena Gauss with history of dysphagia.  She has had multiple EGDs,  the last one being on July 12th of 2002 by Dr. Jena Gauss.  She underwent  dilatation with a 56-French maloney dilator for cervical esophageal web.  She was also found to have a small hiatal hernia.  She notes resolution of  her symptoms up until about one month ago, where she noted pill dysphagia  initially.  This has now progressed to occur with not only pills, but both  solids and liquids.  She also notes retrosternal pain with swallowing.  She  denies any regurgitation, nausea or vomiting.  She is complaining of  heartburn as well as dysphonia and hoarseness.  She is complaining of early  morning nonproductive coughing as well.  She denies any associated abdominal  pain, rectal bleeding or melena.  She denies any early satiety.  She does  have water brash, which is worse postprandially.  Bowel movements have been  soft, brown and on a daily basis.   PAST MEDICAL HISTORY:  Significant for:  1.  Complete hysterectomy.  2.  Bladder tacking.  3.  Appendectomy.  4.  Tonsillectomy.  5.  Remote peptic ulcer disease.  6.  Chronic GERD.  7.  Dysphagia with cervical esophageal web.  8.  Dilation as described in HPI.  9.  History of B1 colon adenocarcinoma, resected by Dr. Lovell Sheehan in 1996 -      Last colonoscopy by Dr. Jena Gauss on July 03, 2003 with normal rectum      status post left hemicolectomy with inadequate visualization of the      cecum followed by air  contrast barium enema which was normal, except for      a 6 mm area of adherent stool versus polyp along the inferior aspect of      the mid distal transverse colon.   CURRENT MEDICATIONS:  1.  Prevacid 30 mg daily.  2.  Ziac 5 mg daily.  3.  Aspirin 81 mg daily.  4.  Pravachol 40 mg daily.  5.  Detrol 4 mg daily.   ALLERGIES:  1.  TETRACYCLINE.  2.  PENICILLIN.  3.  SULFA DRUGS.   FAMILY HISTORY:  There is no known family history of colorectal carcinoma,  liver or chronic GI problems.  Mother deceased at age 59 and father deceased  at age 50 due to unknown etiologies.  She has two sisters and one brother  with history significant for thyroid disease and lupus.   SOCIAL HISTORY:  Ms. Musto is a widow.  She lives alone.  She has two grown  healthy children.  She is a retired Teacher, music.  She denied  any tobacco, alcohol or drug use.   REVIEW OF SYSTEMS:  CONSTITUTIONAL:  Weight is stable.  Appetite is okay.  Denies any fever or chills.  CARDIOVASCULAR:  Does report retrosternal pain,  which is typically postprandially.  No heart palpitations.  PULMONARY:  Denies any shortness of breath, dyspnea, cough or hemoptysis.  GI:  See HPI.  Denies any constipation or diarrhea.   PHYSICAL EXAMINATION:  VITAL SIGNS:  Weight 144.25 pounds; blood pressure  112/90; pulse 64.  GENERAL:  Ms. Mealy is a 77 year old Caucasian female who is alert,  oriented, pleasant and cooperative in no acute distress.  HEENT:  Sclerae clear, nonicteric.  Conjunctivae pink.  Oropharynx pink and  moist without any lesions.  NECK:  Supple without mass or thyromegaly.  CHEST:  Heart regular rate and rhythm with a 2/6 systolic ejection murmur  noted.  LUNGS:  Clear to auscultation bilaterally.  ABDOMEN:  Positive bowel sounds times four.  Soft, nontender, nondistended  without mass or hepatosplenomegaly.  No rebound tenderness or guarding.  EXTREMITIES:  She does have trace lower extremity  edema bilaterally; 2+  pedal pulses.  SKIN:  Pink, warm and dry without rash or jaundice.   IMPRESSION:  Ms. Spanier is a 77 year old Caucasian female with one month  history of recurrent dysphagia, which initially began as pill dysphagia and  now has progressed to dysphagia with both solids and liquids.  She is also  complaining of retrosternal postprandially pain and an exacerbation of her  heartburn and water brash.  She has an underlying history of chronic  gastroesophageal reflux disease and cervical esophageal web, which may have  recurred.  Further evaluation is necessary with possible dilation of  recurrent web.  Given her history, we also should rule out esophageal  carcinoma as well.   She also has a history of colon carcinoma with last colonoscopy by Dr. Jena Gauss  July 03, 2003.  She is due for a repeat exam in March of 2010 unless she  develops any problems.   RECOMMENDATIONS:  1.  We will schedule an EGD with possible esophageal dilation with Dr. Jena Gauss      in the very near future.  She will need SBE prophylaxis given her      history of murmur.  I have discussed risks and benefits, which include,      but are not limited to, bleeding, infection, perforation, drug reaction.      She agrees with the plan.  Consent will be obtained.  2.  She is to increase her Prevacid 30 mg to b.i.d. until the procedure.  3.  Further recommendations pending the procedure.     ____________________  R. Roetta Sessions, M.D.     ____________________  Nicholas Lose, N.P.     Fredric Dine   KC/MEDQ  D:  03/16/2004  T:  03/16/2004  Job:  [id number]   cc:   Kirk Ruths, M.D.  [address]  [place]  [place] [address]  Fax: [phone]

## 2010-08-27 NOTE — H&P (Signed)
NAME:  Kayla Lane, Kayla Lane                         ACCOUNT NO.:  [id number]   MEDICAL RECORD NO.:  [id number]                   PATIENT TYPE:  OIB   LOCATION:  5013                                 FACILITY:  [facility]   PHYSICIAN:  Sharolyn Douglas, M.D.                     DATE OF BIRTH:  Mar 03, 1926   DATE OF ADMISSION:  04/10/2002  DATE OF DISCHARGE:  04/11/2002                                HISTORY & PHYSICAL   CHIEF COMPLAINT:  Back pain.   HISTORY OF PRESENT ILLNESS:  The patient is a 77 year old female with back  pain status post a fall at church the Friday one week ago. She complains of  severe pain with movement. She is unable to do any of her activities of  daily living secondary to the severe pain in her back. It is worse when she  tries to lie down in a flat position; however, it is constantly there and  severe. She reports that she never had any previous problems with the pain  prior to the fall. She did hit her shoulder on the door jam prior to landing  hard on the ground. Shoulder pain is significantly improved, and she is not  having any significant problems with that at this point. She denies any  paresthesias or numbness, bladder or bowel symptoms or problems, or  weakness.   ALLERGIES:  TETRACYCLINE, IV DYE, PENICILLIN CAUSES HOT FLUSHING FOR THE  PATIENT, AND SULFA.   MEDICATIONS:  1. Prevacid.  2. Ziac.  3. Pravachol.  4. Ditropan.  5. Aspirin which she has stopped in preparation for surgery.  6. Muscle relaxer.  7. Darvocet.   PAST MEDICAL HISTORY:  Significant for GERD, coronary artery disease, and  colon cancer.   PAST SURGICAL HISTORY:  1. Colon resection in '97.  2. Hysterectomy.  3. Appendectomy.  4. Cardiac stent in the 1990s.  5. Cataract surgery bilaterally in 2002.   SOCIAL HISTORY:  Denies tobacco use, denies alcohol use. She is widowed. She  works part time for [employer]. She lives alone. She does have family to help her  postoperatively if needed.   FAMILY HISTORY:  Noncontributory.   REVIEW OF SYMPTOMS:  The patient denies any fevers, chills, sweats, or  bleeding tendencies. CENTRAL NERVOUS SYSTEM:  Denies blurred vision, double  vision, seizures, headaches. CARDIOVASCULAR:  Denies chest pain, angina,  intermittent claudication, or palpitations. PULMONARY:  Denies shortness of  breath, productive cough, or hemoptysis. GI:  Denies nausea, vomiting,  diarrhea, constipation, melena, or bloody stools. GU:  Denies dysuria or  hematuria. MUSCULOSKELETAL:  As per HPI.   PHYSICAL EXAMINATION:  VITAL SIGNS:  Blood pressure 156/86, respirations 16  and unlabored, pulse is 84 and regular.  GENERAL APPEARANCE:  The patient is a 77 year old white female who is alert  and oriented in no acute distress. Well-developed, well-nourished, appears  stated stage, pleasant and  cooperative to examination.  HEENT:  Head is normocephalic, atraumatic. Pupils are equal, round, and  reactive. Extraocular movements intact. Nares patent. Oropharynx clear.  NECK:  Supple to palpation. No bruits appreciated. No lymphadenopathy. No  thyromegaly.  CHEST:  Clear to auscultation bilaterally. No rales, rhonchi, extraocular  wheezes, or friction rubs.  BREASTS:  Not pertinent and not performed.  HEART:  S1 and S2; regular, rate, and rhythm. No murmurs, gallops, or rubs  noted.  ABDOMEN:  Soft to palpation. Nontender. Nondistended. No organomegaly noted.  GENITOURINARY:  Not pertinent and not performed.  EXTREMITIES:  Neurovascularly intact. Pulses are intact. Motor function and  sensation is intact x4.  SKIN:  Intact without any lesions or rashes.   RADIOLOGIC FINDINGS:  X-ray shows a T12 compression fracture. MRI shows to  be acute in nature without any retropulsion.   IMPRESSION:  1. T12 compression fracture.  2. Coronary artery disease.  3. Coronary stent placement early 90s.  4. History of colon cancer.  5. Gastroesophageal reflux disease.   PLAN:  1.  Admit to [facility] hospital on 04/10/02 for a T12 classic __________ by     Dr. Sharolyn Douglas.  2. The patient's primary care physician is Dr. Nobie Putnam who has dictated     that the patient is okay to proceed with surgery as planned.     Verlin Fester, P.A.                       Sharolyn Douglas, M.D.    CM/MEDQ  D:  04/12/2002  T:  04/12/2002  Job:  [id number]

## 2010-08-27 NOTE — H&P (Signed)
NAMEAMYLA, HEFFNER NO.:  [id number]   MEDICAL RECORD NO.:  [id number]                  PATIENT TYPE:   LOCATION:                                       FACILITY:   PHYSICIAN:  R. Roetta Sessions, M.D.              DATE OF BIRTH:   DATE OF ADMISSION:  DATE OF DISCHARGE:                                HISTORY & PHYSICAL   CHIEF COMPLAINT:  History of B1 colon cancer  status post low anterior  resection in 1996, here to setup surveillance colonoscopy.   Ms. Lieber is a 77 year old lady who had as stated above B1 colon cancer  resected by Dr. Lovell Sheehan in 1996.  Her follow-up colonoscopies have been  negative; the last was in 2000.  She is devoid of any lower GI tract  symptoms.  She wants to have a surveillance colonoscopy.  This lady also has  a long history of gastroesophageal reflux disease and an element of LPR as  well.  She has seen Dr. Serena Colonel previously and he has seen her in  evaluation on several occasions.  Her typical reflux symptoms are well  controlled on Prevacid 30 mg orally daily.  She tells me that for the past 2  years she has lost her taste intermittently, not really being able to taste  foods very well.  She did not relate this to Dr. Pollyann Kennedy when she saw him  previously.   PAST MEDICAL HISTORY:  1. Hysterectomy.  2. Bladder tacking.  3. Appendectomy.  4. Tonsillectomy.  5. Peptic ulcer disease.  6. History of GERD.  7. History of dysphagia secondary to cervical esophageal web dilated by me     previously.   CURRENT MEDICATIONS:  1. Prevacid 30 mg orally daily.  2. Ziac 5 mg daily.  3. Aspirin 81 mg daily.  4. Pravachol 40 mg daily.  5. Detrol 4 mg daily.   ALLERGIES:  1. PENICILLIN.  2. TETRACYCLINE.  3. SULFA DRUGS.   History of coronary artery disease status post stent placement back in 1995.  Rectocele repair previously.   FAMILY HISTORY:  Negative for chronic GI or liver disease.   SOCIAL HISTORY:  The  patient is widowed.  She has two children.  She is a  retired Fish farm manager.  No tobacco, no alcohol.   REVIEW OF SYSTEMS:  As in history of present illness.   PHYSICAL EXAMINATION:  GENERAL:  A pleasant 77 year old lady resting  comfortably.  VITAL SIGNS:  Weight 147, blood pressure 122/70, pulse of 70.  SKIN:  Warm and dry, no jaundice.  HEENT:  No scleral icterus, conjunctivae are pink.  She has dentures above  and below.  JVD is not prominent.  CHEST:  Lungs are clear to auscultation.  CARDIAC:  Regular rate and rhythm with a 2/6 systolic ejection murmur at the  .  BREAST:  Deferred.  ABDOMEN:  Nondistended, positive bowel  sounds, soft, nontender, without  appreciable mass or organomegaly.  RECTAL:  Deferred to the time of colonoscopy.   IMPRESSION:  Ms. Beyla Loney is a 77 year old lady with a history of B1  colon cancer resection by Dr. Lovell Sheehan in 1996 with a negative colonoscopy  2000.  She is due for surveillance.  I have discussed this approach with her  at some length.  Potential risks, benefits, alternatives have been reviewed,  questions answered; she is agreeable.  Also, she has a long history of  gastroesophageal reflux disease with a component of LPR clinically well  controlled on Prevacid.  She gives a history of diminished taste which is of  uncertain significance at time.  We may need her to go back to see Dr. Pollyann Kennedy  whom she has seen previously.  Will make further recommendations once  colonoscopy was been performed.     ___________________________________________                                         Jonathon Bellows, M.D.   RMR/MEDQ  D:  06/16/2003  T:  06/16/2003  Job:  [id number]   cc:   Kirk Ruths, M.D.  [address]  [place]  [place] [address]  Fax: [phone]   Jefry H. Pollyann Kennedy, M.D.  [address] [place]  [place] [address]  Fax: [phone]

## 2010-08-27 NOTE — H&P (Signed)
Kayla Lane, Kayla Lane               ACCOUNT NO.:  [id number]   MEDICAL RECORD NO.:  [id number]          PATIENT TYPE:  AMB   LOCATION:                                FACILITY:  [facility]   PHYSICIAN:  R. Roetta Sessions, M.D. DATE OF BIRTH:  1925/05/27   DATE OF ADMISSION:  DATE OF DISCHARGE:  LH                                HISTORY & PHYSICAL   CHIEF COMPLAINT:  Right lower quadrant abdominal pain, history of I-B colon  cancer, status post resection, left colon, in 1996.   Kayla Lane has been having now a 3-52-month history of intermittent right  lower quadrant abdominal pain not really associated with bowel function,  some vague worsening with eating.  She has localized tenderness in the right  lower quadrant if she lies on that side or pressure is on that side, she has  problems.  Gallbladder ultrasound and CT of the abdomen (done at [facility]) failed to demonstrate a cause for her symptoms.  The gallbladder  appeared normal.  HIDA demonstrated a low gallbladder EF of 30% but no  reproduction of the patient's symptoms.  She says she has lost about 10  pounds over the past couple of months.  Indeed, she has lost 3 pounds since  February 02, 2006.  Has not had any melena or rectal bleeding, no odynophagia  or dysphagia, early satiety, vomiting.  Reflux symptoms well-controlled on  Nexium 40 mg orally daily.  She has been told she has degenerative disk  disease in her lower spine, for which she is seeing a doctor down in  [place].  She has had an MRI.  Labs from January 13, 2006:  H&H was okay  at 12.4 and 3.6, MCV 95.0.  Chem-20 looked good.  Amylase minimally elevated  at 161 with a lipase of 17.  CEA 1.5.  Last colonoscopy was in March 2005.  Cecum not seen well.  This was accompanied with an air contrast barium  enema, and the right colon appeared normal.   [name] tells me she has 1-3 bowel movements daily.  Bowel pain not  really associated with having a bowel movement.   May be slightly worsened  with eating.   PAST MEDICAL HISTORY:  1. History of remote peptic ulcer disease.  2. GERD.  3. Dysphagia with cervical esophageal web, status post dilation.  4. History of coronary artery disease, status post two stents.  5. History of I-B colon cancer, status post resection of the left colon in      1996.  Last colonoscopy, as stated above, March 2005.  6. History of a heart murmur.  7. Hysterectomy.  8. Bladder tacking.  9. Appendectomy as a child.   CURRENT MEDICATIONS:  1. Ziac 5/6.25 mg daily.  2. Detrol 4 mg daily.  3. Nexium 40 mg daily.  4. Zocor 40 mg at bedtime.  5. ASA 81 mg daily.  6. Diovan 160 mg daily.  7. Darvocet one q.4-6h. p.r.n.  8. Hydrocodone one q.4-6h. p.r.n.   ALLERGIES:  SULFA, TETRACYCLINE, PENICILLIN.   FAMILY HISTORY:  No  history of chronic GI or liver illness.   SOCIAL HISTORY:  The patient is widowed.  She lives alone.  She has two  grown children.  She is a retired Nature conservation officer.  No tobacco or alcohol.   REVIEW OF SYSTEMS:  Weight loss as outlined above.  No chest pain, dyspnea  on exertion.   PHYSICAL EXAMINATION:  GENERAL:  A pleasant 77 year old lady resting  comfortably.  VITAL SIGNS:  Weight 137, height 5 feet 5 inches, temperature 98.3, BP  122/60, pulse 76.  SKIN:  Warm and dry.  No jaundice or cutaneous stigmata of chronic liver  disease.  HEENT:  No scleral icterus.  Conjunctivae pink.  CHEST:  Lungs are clear to auscultation.  CARDIAC:  Regular rate and rhythm without murmur, rub, or gallop.  ABDOMEN:  Nondistended.  Has multiple scars.  Positive bowel sounds.  Soft.  She has localized tenderness, right lower quadrant, a little guarding with  deep palpation.  No appreciable mass or organomegaly.  EXTREMITIES:  No edema.   IMPRESSION:  Kayla Lane is a 77 year old lady with a 46-month history of  right lower quadrant abdominal pain.  I really not sense that symptoms are   temporally related with having a bowel movement.  She does have a localized  right lower quadrant tenderness.  CT reportedly unrevealing for any  significant process.  Appendix is out.  Symptoms are not consistent with  irritable bowel syndrome.  They are too low to be gallbladder and less  likely related to acid peptic disease.  She has a history of colon cancer  but had negative imaging of her right colon in early 2005.  She may well  have adhesive disease to account for her symptoms.  I doubt this is  radiculopathy as she has localized tenderness.   Her weight loss is bothersome to me.   RECOMMENDATIONS:  I feel that the best course of action would be to go ahead  a colonoscopy at this point in time to get a good look at her right colon  and terminal ileum.  If that study is unrevealing, would have to consider  the possibility of the patient developing critical adhesive disease  contributing to her symptoms.  Would give some consideration to getting her  back to see Dr. Franky Macho for consideration of a laparoscopy.  Will make  further recommendations in the near future.      Jonathon Bellows, M.D.  Electronically Signed    RMR/MEDQ  D:  02/20/2006  T:  02/20/2006  Job:  [id number]   cc:   Kirk Ruths, M.D.  Fax: [phone]

## 2010-08-27 NOTE — Op Note (Signed)
NAME:  Kayla Lane, Kayla Lane NO.:  [id number]   MEDICAL RECORD NO.:  [id number]          PATIENT TYPE:  AMB   LOCATION:  DSC                          FACILITY:  [facility]   PHYSICIAN:  Sharolyn Douglas, M.D.        DATE OF BIRTH:  02-12-1926   DATE OF PROCEDURE:  DATE OF DISCHARGE:                                 OPERATIVE REPORT   DIAGNOSES:  1. Lumbar scoliosis and spondylosis.  2. Lumbar spinal stenosis.   PROCEDURE:  1. L3-L4 and L4-L5 bilateral facet injections.  2. Right L5 transforaminal steroid injection.  3. Fluoroscopic imagine use for needle placement on above injections.   SURGEON:  Sharolyn Douglas, M.D.   ASSISTANT:  None.   ANESTHESIA:  Local plus MAC.   ESTIMATED BLOOD LOSS:  None.   COMPLICATIONS:  None.   INDICATIONS:  The patient is a pleasant 77 year old female with severe back  and right lower extremity pain secondary to lumbar degenerative scoliosis  and spinal stenosis.  She has failed to respond to other treatments and now  elected to undergo bilateral facet injections at L3-L4 and L4-L5, and a  right L5 transforaminal epidural injection in the hopes of improving her  symptoms.  The risks and benefits were reviewed.   DESCRIPTION OF PROCEDURE:  The patient was identified in the holding area  after informed consent, taken to the operating room.  She was sedated by  anesthesia, turned prone.  The back was prepped and draped in usual sterile  fashion.  Fluoroscopy was brought into the field and the L3-L4 and L4-L5  facet joints were identified.  The 22 gauge spinal needles were then  advanced into the facet joints at L3-L4 and L4-L5 bilaterally.  Omnipaque  injected to conform intraarticular spread.  We then injected a solution of 1  mL of 1% preservative free Lidocaine and 20 mg of Depo-Medrol into each  facet joint.  The needles were removed.  We then turned our attention to  performing a right L5 transforaminal epidural injection.  Oblique imaging  was used to obtain a view of the right L5 pedicle.  The 22 gauge spinal  needle was then advanced to the 6 o'clock position under the L5 pedicle.  Aspiration was performed.  There was no blood or CSF.  Omnipaque 1 mL  injected and there was spread of the contrast along the nerve root and  into the epidural space.  We then injected a solution of 4 mL 1%  preservative free Lidocaine and 80 mg of Depo-Medrol.  Band-Aids were  placed.  The patient was turned supine and transferred to recovery in stable  condition.  Post injection instructions were reviewed with her and her  daughter.  She will followup in 2 weeks.      Sharolyn Douglas, M.D.  Electronically Signed     MC/MEDQ  D:  12/05/2005  T:  12/06/2005  Job:  [id number]

## 2010-08-27 NOTE — Cardiovascular Report (Signed)
NAME:  Kayla Lane, Kayla Lane NO.:  [id number]   MEDICAL RECORD NO.:  [id number]          PATIENT TYPE:  OIB   LOCATION:  1963                         FACILITY:  [facility]   PHYSICIAN:  Charlies Constable, M.D. [facility] DATE OF BIRTH:  1926/03/07   DATE OF PROCEDURE:  DATE OF DISCHARGE:                              CARDIAC CATHETERIZATION   PAST MEDICAL HISTORY:  Kayla Lane is 77 years old and had previous stent  placed to the LAD in [place] a number of years ago.  She recently had  developed exertional dyspnea and had a Cardiolite scan which suggested  anterior ischemia.  She was scheduled for an outpatient catheterization by  Dr. Dietrich Pates.   PROCEDURE:  The procedure was performed by the right femoral artery, an  arterial sheath and 4 [place] preformed coronary catheters.  After arterial  puncture was performed, Omnipaque contrast was used.  This was performed to  rule out renal vascular causes for hypertension.  Patient tolerated the  procedure well and left the laboratory in satisfactory condition.   RESULTS:  Aortic pressure was 179/74 with mean of 117.  The left __________  pressure was 179/15.   The left main coronary artery __________  gave rise to 2 septal perforators  and 2 diagonal branches.  The stent in the proximal LAD had 20% narrowing.  There was 40% narrowing in proximal stent.  There was 30 and 50% stenoses in  the mid vessel.  Distal vessels free of significant obstruction.  They gave  rise to 2 diagonal branches and 2 septal perforators.   The circumflex artery gave rise to 2 small marginal branches and 2  posterolateral branches.  There was 70% narrowing in the proximal portion of  the second marginal branch before it bifurcated into 2 sub branches.   The right coronary artery was a moderate-sized vessel, gave rise to a  posterior ascending branch and a posterolateral branch.  There was also an  acute marginal branch.  The vessel was irregular in its mid  portion, but  there was no significant obstruction.   The left __________  shows good wall motion with no areas of hypokinesis.  The estimated ejection fraction was 65%.   __________  was performed, which showed patent renal arteries and no  significant aortoiliac obstruction.   CONCLUSION:  Mostly non-obstructive coronary artery disease with 40%  stenosis in the proximal LAD, 20% narrowing within the stent in the proximal  LAD, 30 and 50% stenoses in the mid LAD, 70% stenosis in the small marginal  branch of the circumflex artery, irregularities in the right coronary artery  and normal LV function.   RECOMMENDATIONS:  While it is possible that the lesion in the marginal  branch might be enough to cause ischemia, this seems unlikely, and her  cardiac scan did not show any ischemia in this distribution.  This is a  fairly small vessel.  The Myoview scan suggested anterior ischemia, but it  does not appear there is any lesion tight enough  to be flow limiting.  She was quite hypertensive during the procedure and it  is possible that hypertensive disease may explain some of her shortness of  breath.  If her pressure remains elevated, we may consider adding Norvasc 5  mg to her Ziac.  We will arrange a follow up with Dr. Dietrich Pates in a couple  of weeks and then with Dr. Nobie Putnam.           ______________________________  Charlies Constable, M.D. [facility]     BB/MEDQ  D:  04/12/2005  T:  04/12/2005  Job:  [id number]   cc:   Patrica Duel, M.D.  Fax: [phone]   [place] Bing, M.D. [facility]  [address]  [address]  [place]  [place] [address]   Charlies Constable, M.D. [facility]  [address]  [address]  [place]  [place] [address]

## 2010-09-07 ENCOUNTER — Other Ambulatory Visit (HOSPITAL_COMMUNITY): Payer: Self-pay | Admitting: Internal Medicine

## 2010-09-07 DIAGNOSIS — M810 Age-related osteoporosis without current pathological fracture: Secondary | ICD-10-CM

## 2010-09-09 ENCOUNTER — Other Ambulatory Visit (HOSPITAL_COMMUNITY): Payer: Self-pay

## 2010-09-14 ENCOUNTER — Encounter (HOSPITAL_COMMUNITY): Payer: Self-pay

## 2010-09-14 ENCOUNTER — Ambulatory Visit (HOSPITAL_COMMUNITY)
Admission: RE | Admit: 2010-09-14 | Discharge: 2010-09-14 | Disposition: A | Discharge status: Home / Self Care | Payer: Medicare Other | Source: Ambulatory Visit | Attending: Internal Medicine | Admitting: Internal Medicine

## 2010-09-14 DIAGNOSIS — Z780 Asymptomatic menopausal state: Secondary | ICD-10-CM | POA: Insufficient documentation

## 2010-09-14 DIAGNOSIS — M810 Age-related osteoporosis without current pathological fracture: Secondary | ICD-10-CM

## 2010-10-26 ENCOUNTER — Other Ambulatory Visit (HOSPITAL_COMMUNITY): Payer: Self-pay | Admitting: Orthopaedic Surgery

## 2010-10-26 DIAGNOSIS — M4120 Other idiopathic scoliosis, site unspecified: Secondary | ICD-10-CM

## 2010-10-26 DIAGNOSIS — M545 Low back pain: Secondary | ICD-10-CM

## 2010-10-29 ENCOUNTER — Ambulatory Visit (HOSPITAL_COMMUNITY)
Admission: RE | Admit: 2010-10-29 | Discharge: 2010-10-29 | Disposition: A | Discharge status: Home / Self Care | Payer: Medicare Other | Source: Ambulatory Visit | Attending: Orthopaedic Surgery | Admitting: Orthopaedic Surgery

## 2010-10-29 DIAGNOSIS — M5137 Other intervertebral disc degeneration, lumbosacral region: Secondary | ICD-10-CM | POA: Insufficient documentation

## 2010-10-29 DIAGNOSIS — M545 Low back pain, unspecified: Secondary | ICD-10-CM | POA: Insufficient documentation

## 2010-10-29 DIAGNOSIS — M4120 Other idiopathic scoliosis, site unspecified: Secondary | ICD-10-CM

## 2010-10-29 DIAGNOSIS — C189 Malignant neoplasm of colon, unspecified: Secondary | ICD-10-CM | POA: Insufficient documentation

## 2010-10-29 DIAGNOSIS — M51379 Other intervertebral disc degeneration, lumbosacral region without mention of lumbar back pain or lower extremity pain: Secondary | ICD-10-CM | POA: Insufficient documentation

## 2010-10-29 DIAGNOSIS — M79609 Pain in unspecified limb: Secondary | ICD-10-CM | POA: Insufficient documentation

## 2010-12-03 ENCOUNTER — Other Ambulatory Visit: Payer: Self-pay | Admitting: Gastroenterology

## 2011-01-06 ENCOUNTER — Emergency Department (HOSPITAL_COMMUNITY): Payer: Medicare Other

## 2011-01-06 ENCOUNTER — Inpatient Hospital Stay (HOSPITAL_COMMUNITY)
Admission: EM | Admit: 2011-01-06 | Discharge: 2011-01-10 | DRG: 395 | Disposition: A | Discharge status: Home / Self Care | Payer: Medicare Other | Attending: Internal Medicine | Admitting: Internal Medicine

## 2011-01-06 ENCOUNTER — Encounter (HOSPITAL_COMMUNITY): Payer: Self-pay | Admitting: *Deleted

## 2011-01-06 DIAGNOSIS — Z85038 Personal history of other malignant neoplasm of large intestine: Secondary | ICD-10-CM

## 2011-01-06 DIAGNOSIS — Z9861 Coronary angioplasty status: Secondary | ICD-10-CM

## 2011-01-06 DIAGNOSIS — C189 Malignant neoplasm of colon, unspecified: Secondary | ICD-10-CM

## 2011-01-06 DIAGNOSIS — I658 Occlusion and stenosis of other precerebral arteries: Secondary | ICD-10-CM | POA: Diagnosis present

## 2011-01-06 DIAGNOSIS — Z8711 Personal history of peptic ulcer disease: Secondary | ICD-10-CM

## 2011-01-06 DIAGNOSIS — M1990 Unspecified osteoarthritis, unspecified site: Secondary | ICD-10-CM

## 2011-01-06 DIAGNOSIS — R634 Abnormal weight loss: Secondary | ICD-10-CM

## 2011-01-06 DIAGNOSIS — D649 Anemia, unspecified: Secondary | ICD-10-CM

## 2011-01-06 DIAGNOSIS — K559 Vascular disorder of intestine, unspecified: Principal | ICD-10-CM | POA: Diagnosis present

## 2011-01-06 DIAGNOSIS — K219 Gastro-esophageal reflux disease without esophagitis: Secondary | ICD-10-CM

## 2011-01-06 DIAGNOSIS — I6529 Occlusion and stenosis of unspecified carotid artery: Secondary | ICD-10-CM

## 2011-01-06 DIAGNOSIS — R112 Nausea with vomiting, unspecified: Secondary | ICD-10-CM

## 2011-01-06 DIAGNOSIS — K5289 Other specified noninfective gastroenteritis and colitis: Secondary | ICD-10-CM | POA: Diagnosis present

## 2011-01-06 DIAGNOSIS — I2510 Atherosclerotic heart disease of native coronary artery without angina pectoris: Secondary | ICD-10-CM

## 2011-01-06 DIAGNOSIS — I679 Cerebrovascular disease, unspecified: Secondary | ICD-10-CM

## 2011-01-06 DIAGNOSIS — Z8719 Personal history of other diseases of the digestive system: Secondary | ICD-10-CM

## 2011-01-06 DIAGNOSIS — K529 Noninfective gastroenteritis and colitis, unspecified: Secondary | ICD-10-CM

## 2011-01-06 DIAGNOSIS — R918 Other nonspecific abnormal finding of lung field: Secondary | ICD-10-CM | POA: Diagnosis present

## 2011-01-06 HISTORY — DX: Spinal stenosis, site unspecified: M48.00

## 2011-01-06 HISTORY — DX: Hyperlipidemia, unspecified: E78.5

## 2011-01-06 LAB — BASIC METABOLIC PANEL WITH GFR
BUN: 25 mg/dL — ABNORMAL HIGH (ref 6–23)
CO2: 27 meq/L (ref 19–32)
Calcium: 10.2 mg/dL (ref 8.4–10.5)
Chloride: 96 meq/L (ref 96–112)
Creatinine, Ser: 0.99 mg/dL (ref 0.50–1.10)

## 2011-01-06 LAB — LIPASE, BLOOD: Lipase: 19 U/L (ref 11–59)

## 2011-01-06 LAB — CBC
HCT: 39 % (ref 36.0–46.0)
Hemoglobin: 13.3 g/dL (ref 12.0–15.0)
MCH: 33.1 pg (ref 26.0–34.0)
MCHC: 34.1 g/dL (ref 30.0–36.0)
MCV: 97 fL (ref 78.0–100.0)
Platelets: 212 K/uL (ref 150–400)
RBC: 4.02 MIL/uL (ref 3.87–5.11)
RDW: 13.2 % (ref 11.5–15.5)
WBC: 15.5 10*3/uL — ABNORMAL HIGH (ref 4.0–10.5)

## 2011-01-06 LAB — DIFFERENTIAL
Basophils Absolute: 0 10*3/uL (ref 0.0–0.1)
Basophils Relative: 0 % (ref 0–1)
Eosinophils Absolute: 0 K/uL (ref 0.0–0.7)
Eosinophils Relative: 0 % (ref 0–5)
Lymphocytes Relative: 3 % — ABNORMAL LOW (ref 12–46)
Lymphs Abs: 0.5 K/uL — ABNORMAL LOW (ref 0.7–4.0)
Monocytes Absolute: 1.2 K/uL — ABNORMAL HIGH (ref 0.1–1.0)
Monocytes Relative: 8 % (ref 3–12)
Neutro Abs: 13.8 10*3/uL — ABNORMAL HIGH (ref 1.7–7.7)
Neutrophils Relative %: 89 % — ABNORMAL HIGH (ref 43–77)

## 2011-01-06 LAB — URINALYSIS, ROUTINE W REFLEX MICROSCOPIC
Glucose, UA: 100 mg/dL — AB
Hgb urine dipstick: NEGATIVE
Leukocytes, UA: NEGATIVE
Nitrite: NEGATIVE
Protein, ur: NEGATIVE mg/dL
Specific Gravity, Urine: >1.03 — ABNORMAL HIGH (ref 1.005–1.030)
Urobilinogen, UA: 1 mg/dL (ref 0.0–1.0)
pH: 5.5 (ref 5.0–8.0)

## 2011-01-06 LAB — HEPATIC FUNCTION PANEL
ALT: 26 U/L (ref 0–35)
AST: 31 U/L (ref 0–37)
Albumin: 3.8 g/dL (ref 3.5–5.2)
Alkaline Phosphatase: 261 U/L — ABNORMAL HIGH (ref 39–117)
Bilirubin, Direct: 0.2 mg/dL (ref 0.0–0.3)
Indirect Bilirubin: 0.6 mg/dL (ref 0.3–0.9)
Total Bilirubin: 0.8 mg/dL (ref 0.3–1.2)
Total Protein: 7.1 g/dL (ref 6.0–8.3)

## 2011-01-06 LAB — BASIC METABOLIC PANEL
GFR calc Af Amer: >60 mL/min (ref >60)
GFR calc non Af Amer: 54 mL/min — ABNORMAL LOW (ref >60)
Glucose, Bld: 157 mg/dL — ABNORMAL HIGH (ref 70–99)
Potassium: 4.8 mEq/L (ref 3.5–5.1)
Sodium: 134 mEq/L — ABNORMAL LOW (ref 135–145)

## 2011-01-06 LAB — LACTIC ACID, PLASMA: Lactic Acid, Venous: 2.3 mmol/L — ABNORMAL HIGH (ref 0.5–2.2)

## 2011-01-06 MED ORDER — FENTANYL CITRATE 0.05 MG/ML IJ SOLN
50.0000 ug | Freq: Once | INTRAMUSCULAR | Status: AC
  Administered 2011-01-06: 50 ug via INTRAVENOUS
  Filled 2011-01-06: qty 2

## 2011-01-06 MED ORDER — CIPROFLOXACIN IN D5W 400 MG/200ML IV SOLN
400.0000 mg | Freq: Once | INTRAVENOUS | Status: AC
  Administered 2011-01-06: 400 mg via INTRAVENOUS
  Filled 2011-01-06: qty 200

## 2011-01-06 MED ORDER — IOHEXOL 300 MG/ML  SOLN
100.0000 mL | Freq: Once | INTRAMUSCULAR | Status: AC | PRN
  Administered 2011-01-06: 100 mL via INTRAVENOUS

## 2011-01-06 MED ORDER — SODIUM CHLORIDE 0.9 % IV SOLN
Freq: Once | INTRAVENOUS | Status: AC
  Administered 2011-01-06: 19:00:00 via INTRAVENOUS

## 2011-01-06 MED ORDER — MORPHINE SULFATE 4 MG/ML IJ SOLN
4.0000 mg | Freq: Once | INTRAMUSCULAR | Status: AC
Start: 2011-01-06 — End: 2011-01-06
  Administered 2011-01-06: 4 mg via INTRAVENOUS
  Filled 2011-01-06: qty 1

## 2011-01-06 MED ORDER — ONDANSETRON HCL 4 MG/2ML IJ SOLN
4.0000 mg | Freq: Once | INTRAMUSCULAR | Status: AC
  Administered 2011-01-06: 4 mg via INTRAVENOUS
  Filled 2011-01-06: qty 2

## 2011-01-06 MED ORDER — METRONIDAZOLE IN NACL 5-0.79 MG/ML-% IV SOLN
500.0000 mg | Freq: Once | INTRAVENOUS | Status: AC
  Administered 2011-01-06: 500 mg via INTRAVENOUS
  Filled 2011-01-06: qty 100

## 2011-01-06 NOTE — ED Notes (Signed)
Resting quietly, no distress, family at bedside.

## 2011-01-06 NOTE — ED Provider Notes (Addendum)
History   Chart scribed for Gavin Pound. Carlos Heber, MD by Enos Fling; the patient was seen in room APA03/APA03; this patient's care was started at 6:19 PM.    CSN: [id number] Arrival date & time: 01/06/2011  5:56 PM  Chief Complaint  Patient presents with  . Abdominal Pain  . Nausea  . Emesis   HPI Kayla Lane is a 77 y.o. female who presents to the Emergency Department complaining of abdominal pain. Pt states lower abd pain onset yesterday but became worse this AM. Pain is described as soreness. Pt ate normal breakfast which caused nausea but no increased pain; decreased appetite since then with a few episodes of vomiting. Pt reports last BM yeterday; used suppository and enema today with small amount of stool but no improvement of pain. Family report fever and chills this evening pta. Pt admits h/o UTI and kidney stones; reports urinary frequency which is not new; denies dysuria or hematuria.    Past Medical History  Diagnosis Date  . Cancer colon    History reviewed. No pertinent past surgical history. 2 stents; total hysterectomy;   History reviewed. No pertinent family history.  History  Substance Use Topics  . Smoking status: Never Smoker   . Smokeless tobacco: Not on file  . Alcohol Use: No    OB History    Grav Para Term Preterm Abortions TAB SAB Ect Mult Living                  Review of Systems 10 Systems reviewed and are negative for acute change except as noted in the HPI.  Allergies  Penicillins; Sulfonamide derivatives; and Tetracycline  Home Medications   Current Outpatient Rx  Name Route Sig Dispense Refill  . ASPIRIN EC 81 MG PO TBEC Oral Take 81 mg by mouth at bedtime.      Marland Kitchen BISOPROLOL-HYDROCHLOROTHIAZIDE 5-6.25 MG PO TABS Oral Take 1 tablet by mouth at bedtime.      Marland Kitchen CALCIUM CARBONATE-VITAMIN D 500-200 MG-UNIT PO TABS Oral Take 1 tablet by mouth daily.      Marland Kitchen PREVACID 24HR 15 MG PO CPDR  TAKE ONE CAPSULE DAILY. 31 capsule 3  . SIMVASTATIN 40  MG PO TABS Oral Take 40 mg by mouth at bedtime.      Marland Kitchen VITAMIN B-12 100 MCG PO TABS Oral Take 50 mcg by mouth daily.      Marland Kitchen HYDROCODONE-ACETAMINOPHEN 5-325 MG PO TABS Oral Take 1 tablet by mouth every 6 (six) hours as needed. For pain     . LOSARTAN POTASSIUM 100 MG PO TABS Oral Take 100 mg by mouth daily.        BP 137/46  Pulse 97  Temp 98.1 F (36.7 C)  Resp 16  SpO2 96%  Physical Exam  Nursing note and vitals reviewed. Constitutional: She is oriented to person, place, and time. She appears well-developed and well-nourished. No distress.  HENT:  Head: Normocephalic and atraumatic.  Right Ear: External ear normal.  Left Ear: External ear normal.  Nose: Nose normal.  Mouth/Throat: Oropharynx is clear and moist.  Eyes: No scleral icterus.       Normal appearance.  Neck: Neck supple.  Cardiovascular: Normal rate and regular rhythm.   Pulmonary/Chest: Effort normal and breath sounds normal.  Abdominal: Soft. She exhibits no distension. There is tenderness (diffuse, worse in lower abd).       Hyperactive bowel sounds; well healed surgical scars to abdomen  Musculoskeletal: Normal range of motion.  Normal pulses  Neurological: She is alert and oriented to person, place, and time.       Motor intact in all extremities  Skin: Skin is warm and dry.       Color normal  Psychiatric: She has a normal mood and affect.    ED Course  Procedures - none  Labs Reviewed  URINALYSIS, ROUTINE W REFLEX MICROSCOPIC - Abnormal; Notable for the following:    Color, Urine AMBER (*) BIOCHEMICALS MAY BE AFFECTED BY COLOR   Specific Gravity, Urine >1.030 (*)    Glucose, UA 100 (*)    Bilirubin Urine SMALL (*)    Ketones, ur TRACE (*)    All other components within normal limits  CBC - Abnormal; Notable for the following:    WBC 15.5 (*)    All other components within normal limits  DIFFERENTIAL - Abnormal; Notable for the following:    Neutrophils Relative 89 (*)    Lymphocytes  Relative 3 (*)    Neutro Abs 13.8 (*)    Lymphs Abs 0.5 (*)    Monocytes Absolute 1.2 (*)    All other components within normal limits  BASIC METABOLIC PANEL - Abnormal; Notable for the following:    Sodium 134 (*)    Glucose, Bld 157 (*)    BUN 25 (*)    GFR calc non Af Amer 54 (*)    All other components within normal limits  HEPATIC FUNCTION PANEL - Abnormal; Notable for the following:    Alkaline Phosphatase 261 (*)    All other components within normal limits  LACTIC ACID, PLASMA - Abnormal; Notable for the following:    Lactic Acid, Venous 2.3 (*)    All other components within normal limits  LIPASE, BLOOD   Ct Abdomen Pelvis W Contrast  01/06/2011  *RADIOLOGY REPORT*  Clinical Data: Abdominal pain and nausea.  History of colon cancer to  CT ABDOMEN AND PELVIS WITH CONTRAST  Technique:  Multidetector CT imaging of the abdomen and pelvis was performed following the standard protocol during bolus administration of intravenous contrast.  Contrast: OMNIPAQUE IOHEXOL 300 MG/ML IV SOLN  Comparison: CT abdomen pelvis 10/21/2009  Findings: Extensive micronodular tree in bud opacities at the lung bases are most prominent the right middle lobe and lingula, but also seen in the anterior right lower lobe.  These findings suggest chronic Mycobacterium Avium Intracellulare.  There is oral contrast within the distal esophagus raising the question of gastroesophageal reflux.  Coronary artery atherosclerosis is seen with normal heart size.  There is extensive bowel wall thickening and pericolonic fat stranding and pericolonic fluid involving the splenic flexure, descending colon, and proximal sigmoid colon,  consistent with acute colitis.  Again noted is chronic very prominent stool burden in the distal sigmoid colon and rectum.  Bowel suture is seen about the distal sigmoid colon, consistent with prior bowel surgery.  The rectum is distended with stool.  No evidence of rectal wall thickening.   Stomach is unremarkable.  Small bowel loops are within normal limits for caliber.  There is no evidence of abscess or perforation.  The abdominal aorta is normal in caliber and patent and contains some scattered atherosclerotic calcification.  The celiac artery, superior mesenteric artery, and inferior mesenteric artery are patent and show no significant evidence of significant atherosclerotic narrowing.  The liver, gallbladder, spleen, adrenal glands, pancreas, and kidneys are stable show no new or significant finding.  Previously described stones in the upper pole of the  right kidney not definitely seen on today's study.  The ureters are normal in caliber.  Appendix is not seen.  No evidence of acute appendicitis.  There are vertebroplasty changes at T12.  Grade 1 anterolisthesis of L1-L5 is stable.  Bones are osteopenic.  There are benign hemangiomas in all of the imaged vertebral bodies.  IMPRESSION:  1. Acute colitis involves the splenic flexure through the proximal sigmoid colon.  Considerations include infectious colitis, inflammatory bowel disease, or ischemic colitis. Please note that the patient had a similar appearing colitis involving the transverse colon and July 2011 2.  Chronic prominent stool burden/fecal impaction in the distal sigmoid colon and rectum, with postsurgical changes of the distal sigmoid colon noted. 3.  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  Original Report Authenticated By: Britta Mccreedy, M.D.   All results reviewed and discussed, questions answered, pt and family agreeable with plan.   OTHER DATA REVIEWED: Nursing notes and vital signs reviewed. Prior records reviewed.  MEDS GIVEN IN ED: ondansetron [facility]) injection 4 mg (4 mg Intravenous Given 01/06/11 1849)  fentaNYL (SUBLIMAZE) 0.05 MG/ML injection 50 mcg (50 mcg Intravenous Given 01/06/11 1850)  0.9 %  sodium chloride infusion (  Intravenous New Bag 01/06/11  1849)  iohexol (OMNIPAQUE) 300 MG/ML injection 100 mL (100 mL Intravenous Contrast Given 01/06/11 2206)     MDM   Pt's exam and presentation was concerning for diverticulitis.  Pt's prior CT from 1 year ago similar to today with diffuse colitis per radiologist.  Pt reports no sig improvement in pain or nausea although no further vomiting.  Pt lives alone.  Will admit to Dr. Phillips Odor and start IV abx which is what improved her the last time.  She did see a GI physician who did a colonscopy later and told no sig problems at that time.  Will continue to treat pain here and admit.    IMPRESSION: 1. Colitis   2. Nausea & vomiting      SCRIBE ATTESTATION: I personally performed the services described in this documentation, which was scribed in my presence. The recorded information has been reviewed and considered. Lear Ng      12:10 AM Spoke to Dr. Regino Schultze who asks that I speak to hospitalist to admit.    Gavin Pound. Oletta Lamas, MD 01/07/11 1610  Gavin Pound. Oletta Lamas, MD 01/07/11 9604

## 2011-01-06 NOTE — ED Notes (Signed)
Drinking contrast.

## 2011-01-06 NOTE — ED Notes (Signed)
Pt c/o severe lower abd pain that started yesterday with n/v

## 2011-01-06 NOTE — ED Notes (Signed)
Visitors at bedside, alert, comfortable at present.  Marland Kitchen

## 2011-01-07 ENCOUNTER — Encounter (HOSPITAL_COMMUNITY): Payer: Self-pay | Admitting: Internal Medicine

## 2011-01-07 DIAGNOSIS — K5289 Other specified noninfective gastroenteritis and colitis: Secondary | ICD-10-CM

## 2011-01-07 DIAGNOSIS — R109 Unspecified abdominal pain: Secondary | ICD-10-CM

## 2011-01-07 LAB — CBC
HCT: 32.6 % — ABNORMAL LOW (ref 36.0–46.0)
Hemoglobin: 11.3 g/dL — ABNORMAL LOW (ref 12.0–15.0)
MCH: 33.5 pg (ref 26.0–34.0)
MCHC: 34.7 g/dL (ref 30.0–36.0)
RDW: 13.4 % (ref 11.5–15.5)

## 2011-01-07 LAB — COMPREHENSIVE METABOLIC PANEL
Albumin: 2.7 g/dL — ABNORMAL LOW (ref 3.5–5.2)
BUN: 19 mg/dL (ref 6–23)
Calcium: 8.7 mg/dL (ref 8.4–10.5)
GFR calc Af Amer: >60 mL/min (ref >60)
Glucose, Bld: 116 mg/dL — ABNORMAL HIGH (ref 70–99)
Total Protein: 5.3 g/dL — ABNORMAL LOW (ref 6.0–8.3)

## 2011-01-07 LAB — MRSA PCR SCREENING: MRSA by PCR: NEGATIVE — AB

## 2011-01-07 LAB — LACTIC ACID, PLASMA: Lactic Acid, Venous: 0.9 mmol/L (ref 0.5–2.2)

## 2011-01-07 LAB — MAGNESIUM: Magnesium: 1.9 mg/dL (ref 1.5–2.5)

## 2011-01-07 LAB — OCCULT BLOOD, POC DEVICE: Fecal Occult Bld: POSITIVE

## 2011-01-07 MED ORDER — HYDROMORPHONE HCL 1 MG/ML IJ SOLN: 0.5000 mg | INTRAMUSCULAR | Status: DC | PRN

## 2011-01-07 MED ORDER — VITAMIN B-12 100 MCG PO TABS
50.0000 ug | ORAL_TABLET | Freq: Every day | ORAL | Status: DC
  Administered 2011-01-07 – 2011-01-10 (×3): 50 ug via ORAL
  Filled 2011-01-07 (×8): qty 1

## 2011-01-07 MED ORDER — SODIUM CHLORIDE 0.9 % IV SOLN
INTRAVENOUS | Status: DC
  Administered 2011-01-07: 100 mL via INTRAVENOUS
  Administered 2011-01-07 – 2011-01-09 (×5): via INTRAVENOUS

## 2011-01-07 MED ORDER — SIMVASTATIN 20 MG PO TABS
40.0000 mg | ORAL_TABLET | Freq: Every day | ORAL | Status: DC
  Administered 2011-01-07 – 2011-01-09 (×3): 40 mg via ORAL
  Filled 2011-01-07: qty 1
  Filled 2011-01-07 (×2): qty 2

## 2011-01-07 MED ORDER — INFLUENZA VIRUS VACC SPLIT PF IM SUSP
0.5000 mL | Freq: Once | INTRAMUSCULAR | Status: AC
  Administered 2011-01-07: 0.5 mL via INTRAMUSCULAR
  Filled 2011-01-07: qty 0.5

## 2011-01-07 MED ORDER — METRONIDAZOLE IN NACL 5-0.79 MG/ML-% IV SOLN
INTRAVENOUS | Status: AC
  Filled 2011-01-07: qty 100

## 2011-01-07 MED ORDER — SODIUM CHLORIDE 0.9 % IJ SOLN
INTRAMUSCULAR | Status: AC
  Filled 2011-01-07: qty 10

## 2011-01-07 MED ORDER — ASPIRIN EC 81 MG PO TBEC
81.0000 mg | DELAYED_RELEASE_TABLET | Freq: Every day | ORAL | Status: DC
  Administered 2011-01-07 – 2011-01-09 (×3): 81 mg via ORAL
  Filled 2011-01-07 (×3): qty 1

## 2011-01-07 MED ORDER — BISOPROLOL FUMARATE 5 MG PO TABS
5.0000 mg | ORAL_TABLET | Freq: Every day | ORAL | Status: DC
  Administered 2011-01-07 – 2011-01-10 (×3): 5 mg via ORAL
  Filled 2011-01-07 (×8): qty 1

## 2011-01-07 MED ORDER — PNEUMOCOCCAL VAC POLYVALENT 25 MCG/0.5ML IJ INJ
0.5000 mL | INJECTION | INTRAMUSCULAR | Status: AC
  Filled 2011-01-07 (×2): qty 0.5

## 2011-01-07 MED ORDER — ONDANSETRON HCL 4 MG PO TABS
4.0000 mg | ORAL_TABLET | Freq: Four times a day (QID) | ORAL | Status: DC | PRN
  Administered 2011-01-08: 4 mg via ORAL
  Filled 2011-01-07: qty 1

## 2011-01-07 MED ORDER — LOSARTAN POTASSIUM 50 MG PO TABS
100.0000 mg | ORAL_TABLET | Freq: Every day | ORAL | Status: DC
  Administered 2011-01-07 – 2011-01-10 (×3): 100 mg via ORAL
  Filled 2011-01-07: qty 2
  Filled 2011-01-07: qty 1
  Filled 2011-01-07 (×3): qty 2

## 2011-01-07 MED ORDER — ACETAMINOPHEN 325 MG PO TABS: 650.0000 mg | ORAL_TABLET | Freq: Four times a day (QID) | ORAL | Status: DC | PRN

## 2011-01-07 MED ORDER — SACCHAROMYCES BOULARDII 250 MG PO CAPS
250.0000 mg | ORAL_CAPSULE | Freq: Two times a day (BID) | ORAL | Status: DC
  Administered 2011-01-07 – 2011-01-10 (×7): 250 mg via ORAL
  Filled 2011-01-07 (×7): qty 1

## 2011-01-07 MED ORDER — ACETAMINOPHEN 650 MG RE SUPP: 650.0000 mg | Freq: Four times a day (QID) | RECTAL | Status: DC | PRN

## 2011-01-07 MED ORDER — CIPROFLOXACIN IN D5W 400 MG/200ML IV SOLN
400.0000 mg | Freq: Two times a day (BID) | INTRAVENOUS | Status: DC
  Administered 2011-01-07 – 2011-01-10 (×7): 400 mg via INTRAVENOUS
  Filled 2011-01-07 (×9): qty 200

## 2011-01-07 MED ORDER — METRONIDAZOLE IN NACL 5-0.79 MG/ML-% IV SOLN
500.0000 mg | Freq: Three times a day (TID) | INTRAVENOUS | Status: DC
  Administered 2011-01-07 – 2011-01-10 (×10): 500 mg via INTRAVENOUS
  Filled 2011-01-07 (×14): qty 100

## 2011-01-07 MED ORDER — HYDROCODONE-ACETAMINOPHEN 5-325 MG PO TABS
1.0000 | ORAL_TABLET | Freq: Four times a day (QID) | ORAL | Status: DC | PRN
  Administered 2011-01-07 (×2): 1 via ORAL
  Filled 2011-01-07 (×2): qty 1

## 2011-01-07 MED ORDER — ONDANSETRON HCL 4 MG/2ML IJ SOLN
4.0000 mg | Freq: Four times a day (QID) | INTRAMUSCULAR | Status: DC | PRN
  Administered 2011-01-08: 4 mg via INTRAVENOUS
  Filled 2011-01-07: qty 2

## 2011-01-07 MED ORDER — CALCIUM CARBONATE-VITAMIN D 500-200 MG-UNIT PO TABS
1.0000 | ORAL_TABLET | Freq: Every day | ORAL | Status: DC
  Administered 2011-01-07 – 2011-01-10 (×4): 1 via ORAL
  Filled 2011-01-07 (×6): qty 1

## 2011-01-07 MED ORDER — PANTOPRAZOLE SODIUM 40 MG IV SOLR
40.0000 mg | INTRAVENOUS | Status: DC
  Administered 2011-01-08: 40 mg via INTRAVENOUS
  Filled 2011-01-07 (×2): qty 40

## 2011-01-07 NOTE — Progress Notes (Signed)
INITIAL ADULT NUTRITION ASSESSMENT Date: 01/07/2011   Time: 11:49 AM Reason for Assessment: MD consult for unintentional wt loss   ASSESSMENT: Female 77 y.o.  Dx: Other and unspecified noninfectious gastroenteritis and colitis  Hx:  Past Medical History  Diagnosis Date  . History of colon cancer      1996  . Coronary artery disease   . Myocardial infarction   . GERD (gastroesophageal reflux disease)   . DJD (degenerative joint disease)   . HTN (hypertension)   . Hyperlipidemia   . Spinal stenosis     Related Meds:  Scheduled Meds:   . sodium chloride   Intravenous Once  . aspirin EC  81 mg Oral QHS  . bisoprolol  5 mg Oral Daily  . calcium-vitamin D  1 tablet Oral Daily  . ciprofloxacin  400 mg Intravenous Once  . ciprofloxacin  400 mg Intravenous Q12H  . fentaNYL  50 mcg Intravenous Once  . influenza  inactive virus vaccine  0.5 mL Intramuscular Once  . losartan  100 mg Oral Daily  . metroNIDAZOLE  500 mg Intravenous Once  . metronidazole  500 mg Intravenous Q8H  . morphine  4 mg Intravenous Once  . ondansetron (ZOFRAN) IV  4 mg Intravenous Once  . pantoprazole (PROTONIX) IV  40 mg Intravenous Q24H  . pneumococcal 23 valent vaccine  0.5 mL Intramuscular Tomorrow-1000  . saccharomyces boulardii  250 mg Oral BID  . simvastatin  40 mg Oral QHS  . sodium chloride      . vitamin B-12  50 mcg Oral Daily   Continuous Infusions:   . sodium chloride 100 mL/hr at 01/07/11 0738   PRN Meds:.acetaminophen, acetaminophen, HYDROcodone-acetaminophen, HYDROmorphone, iohexol, ondansetron (ZOFRAN) IV, ondansetron   Ht: 5\' 5"  (165.1 cm)  Wt: 102 lb 1.2 oz (46.3 kg)  Ideal Wt: 57 kg  % Ideal Wt: 82  Usual Wt: 134# % Usual Wt: 82%  Body mass index is 16.99 kg/(m^2).  Food/Nutrition Related Hx: Spoke with pt, pt sister, and pt daughter. Pt reports she has has a hx of GI problems and wt loss. Pt reports she maintain a wt of 134# until 1-2 years ago, where she continued to lose  weight despite having a good appetite. Wt loss of 32# (24%) x 1 year is significant.  Reports large weight loss over the summer. Denies swallowing difficulties. Pt has several dental issues and reports having difficulty chewing some foods, particularly meats. Pt states that she cooks her meat in a crock pot at home so it is tender enough for her to tolerate. Pt eats a balanced diet with lots of fruits and veggies. NPO at this time, but may be advanced to clears if cleared by GI.  Labs:  CBC    Component Value Date/Time   WBC 12.5* 01/07/2011 0537   RBC 3.37* 01/07/2011 0537   HGB 11.3* 01/07/2011 0537   HCT 32.6* 01/07/2011 0537   PLT 176 01/07/2011 0537   MCV 96.7 01/07/2011 0537   MCH 33.5 01/07/2011 0537   MCHC 34.7 01/07/2011 0537   RDW 13.4 01/07/2011 0537   LYMPHSABS 0.5* 01/06/2011 1825   MONOABS 1.2* 01/06/2011 1825   EOSABS 0.0 01/06/2011 1825   BASOSABS 0.0 01/06/2011 1825     CMP     Component Value Date/Time   NA 133* 01/07/2011 0537   K 3.8 01/07/2011 0537   CL 100 01/07/2011 0537   CO2 25 01/07/2011 0537   GLUCOSE 116* 01/07/2011 0537  BUN 19 01/07/2011 0537   CREATININE 0.90 01/07/2011 0537   CALCIUM 8.7 01/07/2011 0537   PROT 5.3* 01/07/2011 0537   ALBUMIN 2.7* 01/07/2011 0537   AST 21 01/07/2011 0537   ALT 17 01/07/2011 0537   ALKPHOS 190* 01/07/2011 0537   BILITOT 0.8 01/07/2011 0537   GFRNONAA 60* 01/07/2011 0537   GFRAA >60 01/07/2011 0537      Intake:   Intake/Output Summary (Last 24 hours) at 01/07/11 1316 Last data filed at 01/07/11 1200  Gross per 24 hour  Intake    350 ml  Output   1001 ml  Net   -651 ml     Diet Order: NPO  Supplements/Tube Feeding: none at this time  IVF:    sodium chloride Last Rate: 100 mL/hr at 01/07/11 0738    Estimated Nutritional Needs:   Kcal:1115-1267 kcals daily Protein:37-46 grams protein daily Fluid:1.1-1.3 L fluid daily  NUTRITION DIAGNOSIS: -Inadequate oral intake (NI-2.1).  Status: Ongoing  RELATED TO: altered GI  function, profound wt loss  AS EVIDENCE BY: NPO, significant wt loss (24%) x 1 year.  MONITORING/EVALUATION(Goals): -Pt will maintain current weight of 102# -Pt will meet >75% of estimated energy needs -Pt will be advanced to PO diet, as medically indicated  EDUCATION NEEDS: -Education needs addressed  INTERVENTION: Will monitor for diet advancement, as medically appropriate. Consider [employer] with clear liquid diet.  Dietitian #: [phone]  DOCUMENTATION CODES Per approved criteria  -Severe malnutrition in the context of acute illness or injury    Orlene Plum 01/07/2011, 11:49 AM

## 2011-01-07 NOTE — ED Notes (Signed)
Stool heme positive for blood, stool is brown in color

## 2011-01-07 NOTE — Progress Notes (Signed)
Subjective: Complaining of abdominal pain worsened bilateral lower quadrants, no fever chills or any bloody diarrhea, Nausea improving no vomiting   Objective: Weight change:   Intake/Output Summary (Last 24 hours) at 01/07/11 0926 Last data filed at 01/07/11 0738  Gross per 24 hour  Intake     50 ml  Output    751 ml  Net   -701 ml    Physical Exam: General: Alert and awake oriented x3 not in any acute distress. HEENT: anicteric sclera, pupils reactive to light and accommodation CVS: S1-S2 clear no murmur rubs or gallops Chest: clear to auscultation bilaterally, no wheezing rales or rhonchi Abdomen: soft, tender in b/l lower quadrants, nondistended, normal bowel sounds, no organomegaly Extremities: no cyanosis, clubbing or edema noted bilaterally Neuro: Cranial nerves II-XII intact, no focal neurological deficits  Lab Results:  Basename 01/07/11 0538 01/07/11 0537 01/06/11 1825  NA -- 133* 134*  K -- 3.8 4.8  CL -- 100 96  CO2 -- 25 27  GLUCOSE -- 116* 157*  BUN -- 19 25*  CREATININE -- 0.90 0.99  CALCIUM -- 8.7 10.2  MG 1.9 -- --  PHOS -- -- --    Basename 01/07/11 0537 01/06/11 1825  AST 21 31  ALT 17 26  ALKPHOS 190* 261*  BILITOT 0.8 0.8  PROT 5.3* 7.1  ALBUMIN 2.7* 3.8    Basename 01/06/11 1825  LIPASE 19  AMYLASE --    Basename 01/07/11 0537 01/06/11 1825  WBC 12.5* 15.5*  NEUTROABS -- 13.8*  HGB 11.3* 13.3  HCT 32.6* 39.0  MCV 96.7 97.0  PLT 176 212   No results found for this basename: CKTOTAL:3,CKMB:3,CKMBINDEX:3,TROPONINI:3 in the last 72 hours No results found for this basename: POCBNP:3 in the last 72 hours No results found for this basename: DDIMER:2 in the last 72 hours No results found for this basename: HGBA1C:2 in the last 72 hours No results found for this basename: CHOL:2,HDL:2,LDLCALC:2,TRIG:2,CHOLHDL:2,LDLDIRECT:2 in the last 72 hours No results found for this basename: TSH,T4TOTAL,FREET3,T3FREE,THYROIDAB in the last 72 hours No  results found for this basename: VITAMINB12:2,FOLATE:2,FERRITIN:2,TIBC:2,IRON:2,RETICCTPCT:2 in the last 72 hours  Micro Results: No results found for this or any previous visit (from the past 240 hour(s)).  Studies/Results: Ct Abdomen Pelvis W Contrast  01/06/2011  *RADIOLOGY REPORT*  Clinical Data: Abdominal pain and nausea.  History of colon cancer to  CT ABDOMEN AND PELVIS WITH CONTRAST  Technique:  Multidetector CT imaging of the abdomen and pelvis was performed following the standard protocol during bolus administration of intravenous contrast.  Contrast: OMNIPAQUE IOHEXOL 300 MG/ML IV SOLN  Comparison: CT abdomen pelvis 10/21/2009  Findings: Extensive micronodular tree in bud opacities at the lung bases are most prominent the right middle lobe and lingula, but also seen in the anterior right lower lobe.  These findings suggest chronic Mycobacterium Avium Intracellulare.  There is oral contrast within the distal esophagus raising the question of gastroesophageal reflux.  Coronary artery atherosclerosis is seen with normal heart size.  There is extensive bowel wall thickening and pericolonic fat stranding and pericolonic fluid involving the splenic flexure, descending colon, and proximal sigmoid colon,  consistent with acute colitis.  Again noted is chronic very prominent stool burden in the distal sigmoid colon and rectum.  Bowel suture is seen about the distal sigmoid colon, consistent with prior bowel surgery.  The rectum is distended with stool.  No evidence of rectal wall thickening.  Stomach is unremarkable.  Small bowel loops are within normal limits  for caliber.  There is no evidence of abscess or perforation.  The abdominal aorta is normal in caliber and patent and contains some scattered atherosclerotic calcification.  The celiac artery, superior mesenteric artery, and inferior mesenteric artery are patent and show no significant evidence of significant atherosclerotic narrowing.  The  liver, gallbladder, spleen, adrenal glands, pancreas, and kidneys are stable show no new or significant finding.  Previously described stones in the upper pole of the right kidney not definitely seen on today's study.  The ureters are normal in caliber.  Appendix is not seen.  No evidence of acute appendicitis.  There are vertebroplasty changes at T12.  Grade 1 anterolisthesis of L1-L5 is stable.  Bones are osteopenic.  There are benign hemangiomas in all of the imaged vertebral bodies.  IMPRESSION:  1. Acute colitis involves the splenic flexure through the proximal sigmoid colon.  Considerations include infectious colitis, inflammatory bowel disease, or ischemic colitis. Please note that the patient had a similar appearing colitis involving the transverse colon and July 2011 2.  Chronic prominent stool burden/fecal impaction in the distal sigmoid colon and rectum, with postsurgical changes of the distal sigmoid colon noted. 3.  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  Original Report Authenticated By: Britta Mccreedy, M.D.   Medications: Scheduled Meds:   . sodium chloride   Intravenous Once  . aspirin EC  81 mg Oral QHS  . bisoprolol  5 mg Oral Daily  . calcium-vitamin D  1 tablet Oral Daily  . ciprofloxacin  400 mg Intravenous Once  . ciprofloxacin  400 mg Intravenous Q12H  . fentaNYL  50 mcg Intravenous Once  . influenza  inactive virus vaccine  0.5 mL Intramuscular Once  . losartan  100 mg Oral Daily  . metroNIDAZOLE  500 mg Intravenous Once  . metronidazole  500 mg Intravenous Q8H  . morphine  4 mg Intravenous Once  . ondansetron (ZOFRAN) IV  4 mg Intravenous Once  . pantoprazole (PROTONIX) IV  40 mg Intravenous Q24H  . pneumococcal 23 valent vaccine  0.5 mL Intramuscular Tomorrow-1000  . simvastatin  40 mg Oral QHS  . sodium chloride      . vitamin B-12  50 mcg Oral Daily   Continuous Infusions:   . sodium chloride 100 mL/hr at  01/07/11 0738   PRN Meds:.acetaminophen, acetaminophen, HYDROcodone-acetaminophen, HYDROmorphone, iohexol, ondansetron (ZOFRAN) IV, ondansetron  Assessment/Plan: Principal Problem:  *COLITIS: - Continue ciprofloxacin and Flagyl and IV fluids, antiemetics - A GI consult to discuss with Dr. Raj Janus, patient has one prior episode of colitis in June 2011. - Patient also had used antibiotics in last 2 months for her dental workup, hence rule out C. Difficile - Start clear liquid diet if okay with gastroenterology and advance as tolerated ,  Active Problems:  CAROTID ARTERY STENOSIS, BILATERAL: Stable    hypertension stable   PUD, HX OF: Continue proton pump inhibitor   prophylaxis bilateral SCDs  Disposition transfer to the floor    LOS: 1 day   Kayla Lane 01/07/2011, 9:26 AM

## 2011-01-07 NOTE — Progress Notes (Signed)
ANTIBIOTIC CONSULT NOTE - INITIAL  Pharmacy Consult for Cipro Indication: Colitis  Allergies  Allergen Reactions  . Penicillins     REACTION: Unknown reaction  . Sulfonamide Derivatives     REACTION: Unknown reaction  . Tetracycline     REACTION: Unknown reaction    Patient Measurements: Height: 5\' 5"  (165.1 cm) Weight: 102 lb 1.2 oz (46.3 kg) IBW/kg (Calculated) : 57   Vital Signs: Temp: 98.8 F (37.1 C) (09/28 0400) Temp src: Axillary (09/28 0400) BP: 115/59 mmHg (09/28 0400) Pulse Rate: 78  (09/28 0400) Intake/Output from previous day: 09/27 0701 - 09/28 0700 In: 50 [I.V.:50] Out: 500 [Urine:500] Intake/Output from this shift: Total I/O In: -  Out: 251 [Urine:250; Stool:1]  Labs:  [facility] 01/07/11 0537 01/06/11 1825  WBC 12.5* 15.5*  HGB 11.3* 13.3  PLT 176 212  LABCREA -- --  CREATININE 0.90 0.99  CRCLEARANCE -- --   No results found for this basename: VANCOTROUGH:2,VANCOPEAK:2,VANCORANDOM:2,GENTTROUGH:2,GENTPEAK:2,GENTRANDOM:2,TOBRATROUGH:2,TOBRAPEAK:2,TOBRARND:2,AMIKACINPEAK:2,AMIKACINTROU:2,AMIKACIN:2, in the last 72 hours   Microbiology: No results found for this or any previous visit (from the past 720 hour(s)).  Medical History: Past Medical History  Diagnosis Date  . Cancer colon  . Coronary artery disease   . Myocardial infarction   . GERD (gastroesophageal reflux disease)     Medications:  Anti-infectives     Start     Dose/Rate Route Frequency Ordered Stop   01/06/11 2315   ciprofloxacin (CIPRO) IVPB 400 mg        400 mg 200 mL/hr over 60 Minutes Intravenous  Once 01/06/11 2304 01/07/11 0033   01/06/11 2315   metroNIDAZOLE (FLAGYL) IVPB 500 mg        500 mg 100 mL/hr over 60 Minutes Intravenous  Once 01/06/11 2304 01/07/11 0015         Assessment: Okay for Protocol CrCl = 46.3 ml/min  Goal of Therapy:  Appropriate Dosing  Plan:  Cipro 400mg  IV every 12 hours.  Lamonte Richer R 01/07/2011,9:58 AM

## 2011-01-07 NOTE — ED Notes (Signed)
Report called, pt alert, skin warm and dry, no distress.family at bedside.

## 2011-01-07 NOTE — H&P (Signed)
Kayla Lane is an 77 y.o. female.   Chief Complaint: Abdominal pain HPI: 77 year old female with H/O CAD S/P stent, H/O CA Colon S/P surgery in 96, presents with abdominal and nausea over the last 24 hrs. The pain is mainly in the epigastric area and left upper quadrant and has become constant. Patient has nausea but no vomting or diarrhea. Has not used any antibiotics recently. In the ER patient had CT abdomen and pelvis showing colitis involoving the splenic flexure area and sigmoid patient is admitted for further managent. Patietn had similar symptoms a year ago and at that time CT showed transverse colon colitis and had colonoscopy with Dr Kendell Bane later and per family was unremarkable.  Past Medical History  Diagnosis Date  . Cancer colon  . Coronary artery disease     Past Surgical History  Procedure Date  . Abdominal hysterectomy   . Appendectomy   . Partial colectomy     Family History  Problem Relation Age of Onset  . Diabetes type II Other    Social History:  reports that she has never smoked. She does not have any smokeless tobacco history on file. She reports that she does not drink alcohol or use illicit drugs.  Allergies:  Allergies  Allergen Reactions  . Penicillins     REACTION: Unknown reaction  . Sulfonamide Derivatives     REACTION: Unknown reaction  . Tetracycline     REACTION: Unknown reaction    Medications Prior to Admission  Medication Dose Route Frequency Provider Last Rate Last Dose  . 0.9 %  sodium chloride infusion   Intravenous Once Gavin Pound. Ghim, MD 75 mL/hr at 01/06/11 1849    . ciprofloxacin (CIPRO) IVPB 400 mg  400 mg Intravenous Once Gavin Pound. Ghim, MD 200 mL/hr at 01/06/11 2333 400 mg at 01/06/11 2333  . fentaNYL (SUBLIMAZE) 0.05 MG/ML injection 50 mcg  50 mcg Intravenous Once Gavin Pound. Ghim, MD   50 mcg at 01/06/11 1850  . iohexol (OMNIPAQUE) 300 MG/ML injection 100 mL  100 mL Intravenous Once PRN Medication Radiologist   100 mL at  01/06/11 2206  . metroNIDAZOLE (FLAGYL) IVPB 500 mg  500 mg Intravenous Once Gavin Pound. Ghim, MD 100 mL/hr at 01/06/11 2315 500 mg at 01/06/11 2315  . morphine 4 MG/ML injection 4 mg  4 mg Intravenous Once Gavin Pound. Ghim, MD   4 mg at 01/06/11 2315  . ondansetron (ZOFRAN) injection 4 mg  4 mg Intravenous Once Gavin Pound. Ghim, MD   4 mg at 01/06/11 1849   Medications Prior to Admission  Medication Sig Dispense Refill  . PREVACID 24HR 15 MG capsule TAKE ONE CAPSULE DAILY.  31 capsule  3    Results for orders placed during the hospital encounter of 01/06/11 (from the past 48 hour(s))  URINALYSIS, ROUTINE W REFLEX MICROSCOPIC     Status: Abnormal   Collection Time   01/06/11  6:05 PM      Component Value Range Comment   Color, Urine AMBER (*) YELLOW  BIOCHEMICALS MAY BE AFFECTED BY COLOR   Appearance CLEAR  CLEAR     Specific Gravity, Urine >1.030 (*) 1.005 - 1.030     pH 5.5  5.0 - 8.0     Glucose, UA 100 (*) NEGATIVE (mg/dL)    Hgb urine dipstick NEGATIVE  NEGATIVE     Bilirubin Urine SMALL (*) NEGATIVE     Ketones, ur TRACE (*) NEGATIVE (mg/dL)  Protein, ur NEGATIVE  NEGATIVE (mg/dL)    Urobilinogen, UA 1.0  0.0 - 1.0 (mg/dL)    Nitrite NEGATIVE  NEGATIVE     Leukocytes, UA NEGATIVE  NEGATIVE  MICROSCOPIC NOT DONE ON URINES WITH NEGATIVE PROTEIN, BLOOD, LEUKOCYTES, NITRITE, OR GLUCOSE <1000 mg/dL.  CBC     Status: Abnormal   Collection Time   01/06/11  6:25 PM      Component Value Range Comment   WBC 15.5 (*) 4.0 - 10.5 (K/uL)    RBC 4.02  3.87 - 5.11 (MIL/uL)    Hemoglobin 13.3  12.0 - 15.0 (g/dL)    HCT 11.9  14.7 - 82.9 (%)    MCV 97.0  78.0 - 100.0 (fL)    MCH 33.1  26.0 - 34.0 (pg)    MCHC 34.1  30.0 - 36.0 (g/dL)    RDW 56.2  13.0 - 86.5 (%)    Platelets 212  150 - 400 (K/uL)   DIFFERENTIAL     Status: Abnormal   Collection Time   01/06/11  6:25 PM      Component Value Range Comment   Neutrophils Relative 89 (*) 43 - 77 (%)    Lymphocytes Relative 3 (*) 12 - 46 (%)      Monocytes Relative 8  3 - 12 (%)    Eosinophils Relative 0  0 - 5 (%)    Basophils Relative 0  0 - 1 (%)    Neutro Abs 13.8 (*) 1.7 - 7.7 (K/uL)    Lymphs Abs 0.5 (*) 0.7 - 4.0 (K/uL)    Monocytes Absolute 1.2 (*) 0.1 - 1.0 (K/uL)    Eosinophils Absolute 0.0  0.0 - 0.7 (K/uL)    Basophils Absolute 0.0  0.0 - 0.1 (K/uL)    WBC Morphology WHITE COUNT CONFIRMED ON SMEAR      Smear Review LARGE PLATELETS PRESENT   GIANT PLATELETS SEEN  BASIC METABOLIC PANEL     Status: Abnormal   Collection Time   01/06/11  6:25 PM      Component Value Range Comment   Sodium 134 (*) 135 - 145 (mEq/L)    Potassium 4.8  3.5 - 5.1 (mEq/L)    Chloride 96  96 - 112 (mEq/L)    CO2 27  19 - 32 (mEq/L)    Glucose, Bld 157 (*) 70 - 99 (mg/dL)    BUN 25 (*) 6 - 23 (mg/dL)    Creatinine, Ser 7.84  0.50 - 1.10 (mg/dL)    Calcium 69.6  8.4 - 10.5 (mg/dL)    GFR calc non Af Amer 54 (*) >60 (mL/min)    GFR calc Af Amer >60  >60 (mL/min)   HEPATIC FUNCTION PANEL     Status: Abnormal   Collection Time   01/06/11  6:25 PM      Component Value Range Comment   Total Protein 7.1  6.0 - 8.3 (g/dL)    Albumin 3.8  3.5 - 5.2 (g/dL)    AST 31  0 - 37 (U/L)    ALT 26  0 - 35 (U/L)    Alkaline Phosphatase 261 (*) 39 - 117 (U/L)    Total Bilirubin 0.8  0.3 - 1.2 (mg/dL)    Bilirubin, Direct 0.2  0.0 - 0.3 (mg/dL)    Indirect Bilirubin 0.6  0.3 - 0.9 (mg/dL)   LIPASE, BLOOD     Status: Normal   Collection Time   01/06/11  6:25 PM  Component Value Range Comment   Lipase 19  11 - 59 (U/L)   LACTIC ACID, PLASMA     Status: Abnormal   Collection Time   01/06/11  7:05 PM      Component Value Range Comment   Lactic Acid, Venous 2.3 (*) 0.5 - 2.2 (mmol/L)    Ct Abdomen Pelvis W Contrast  01/06/2011  *RADIOLOGY REPORT*  Clinical Data: Abdominal pain and nausea.  History of colon cancer to  CT ABDOMEN AND PELVIS WITH CONTRAST  Technique:  Multidetector CT imaging of the abdomen and pelvis was performed following the  standard protocol during bolus administration of intravenous contrast.  Contrast: OMNIPAQUE IOHEXOL 300 MG/ML IV SOLN  Comparison: CT abdomen pelvis 10/21/2009  Findings: Extensive micronodular tree in bud opacities at the lung bases are most prominent the right middle lobe and lingula, but also seen in the anterior right lower lobe.  These findings suggest chronic Mycobacterium Avium Intracellulare.  There is oral contrast within the distal esophagus raising the question of gastroesophageal reflux.  Coronary artery atherosclerosis is seen with normal heart size.  There is extensive bowel wall thickening and pericolonic fat stranding and pericolonic fluid involving the splenic flexure, descending colon, and proximal sigmoid colon,  consistent with acute colitis.  Again noted is chronic very prominent stool burden in the distal sigmoid colon and rectum.  Bowel suture is seen about the distal sigmoid colon, consistent with prior bowel surgery.  The rectum is distended with stool.  No evidence of rectal wall thickening.  Stomach is unremarkable.  Small bowel loops are within normal limits for caliber.  There is no evidence of abscess or perforation.  The abdominal aorta is normal in caliber and patent and contains some scattered atherosclerotic calcification.  The celiac artery, superior mesenteric artery, and inferior mesenteric artery are patent and show no significant evidence of significant atherosclerotic narrowing.  The liver, gallbladder, spleen, adrenal glands, pancreas, and kidneys are stable show no new or significant finding.  Previously described stones in the upper pole of the right kidney not definitely seen on today's study.  The ureters are normal in caliber.  Appendix is not seen.  No evidence of acute appendicitis.  There are vertebroplasty changes at T12.  Grade 1 anterolisthesis of L1-L5 is stable.  Bones are osteopenic.  There are benign hemangiomas in all of the imaged vertebral bodies.   IMPRESSION:  1. Acute colitis involves the splenic flexure through the proximal sigmoid colon.  Considerations include infectious colitis, inflammatory bowel disease, or ischemic colitis. Please note that the patient had a similar appearing colitis involving the transverse colon and July 2011 2.  Chronic prominent stool burden/fecal impaction in the distal sigmoid colon and rectum, with postsurgical changes of the distal sigmoid colon noted. 3.  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  Original Report Authenticated By: Britta Mccreedy, M.D.    Review of Systems  Constitutional: Negative.   Eyes: Negative.   Respiratory: Negative.   Cardiovascular: Negative.   Gastrointestinal: Positive for nausea and abdominal pain.  Genitourinary: Negative.   Musculoskeletal: Negative.   Skin: Negative.   Neurological: Negative.   Endo/Heme/Allergies: Negative.   Psychiatric/Behavioral: Negative.     Blood pressure 137/46, pulse 97, temperature 98.1 F (36.7 C), resp. rate 16, SpO2 96.00%. Physical Exam  Constitutional: She is oriented to person, place, and time. She appears well-developed.  HENT:  Head: Normocephalic and atraumatic.  Eyes: Conjunctivae and EOM are normal.  Pupils are equal, round, and reactive to light.  Neck: Normal range of motion. Neck supple.  Cardiovascular: Normal rate and regular rhythm.   Respiratory: Effort normal and breath sounds normal.  GI: Soft. She exhibits no mass. There is tenderness. There is no rebound and no guarding.  Musculoskeletal: Normal range of motion.  Neurological: She is alert and oriented to person, place, and time.  Skin: Skin is warm and dry.  Psychiatric: Her behavior is normal.     Assessment/Plan 1)Colitis 2)H/O CA Colon 3)CAD S/P stent  Plan: Admit to medical floor. For her colitis and abdominal pain we will keep patient NPO except medictions. Empirically treat with cipro and flagyl.  Hydrate with iv fluids. I am going to check her lactic acid levels in AM again. May consult Dr Kendell Bane in AM.  Eduard Clos. 01/07/2011, 2:02 AM

## 2011-01-07 NOTE — Consult Note (Signed)
Referring Provider: [facility]    Primary Care Physician: Dr. Assunta Found Primary Gastroenterologist:  Roetta Sessions, MD  Reason for Consultation:  colitis  HPI: Kayla Lane is a 77 y.o. female admitted with complaints of abdominal pain. She states she has chronic intermittent abdominal pain mostly in the right lower abdomen. Last week however her pain was worse. By yesterday she was doubled over in pain. She describes the pain as sharp. Usually she has pain at all times but sometimes is worse than others. Symptoms are also associated with chronic weight loss. In 2010 she weighed 117 pounds. She weighs 102 pounds now. She has chronic back pain just saw her specialist last week. She denies any vomiting. She states her appetite has been good however her sister is worried that she is not eating enough. Denies diarrhea, constipation, melena, rectal bleeding. She states prior to presenting to the hospital she tried using suppositories and enemas because of a couple of days without a bowel movement. Her heartburn is well controlled on Prevacid. Dysphasia or odynophagia.  CT of the abdomen and pelvis done yesterday showed acute colitis involving the splenic flexure to the proximal sigmoid colon. Appearance is very similar to what was seen on CT back in July 2011. Notably she have a followup colonoscopy at that time it which did not show any abnormalities. In addition the CT showed chronic prominent stool burden/fecal impaction in the distal sigmoid colon and rectum. She had free in bud nodularity at the lung bases suggestive of mycobacterium avium intracellulare infection.  Upon presentation her white cell count was 15,000. Stool was Hemoccult positive. Hemoglobin yesterday was 13.3, today is 11.3. Her alkaline phosphatase is elevated at 261 yesterday and 190 today.   Prior to Admission medications   Medication Sig Start Date End Date Taking? Authorizing Provider  aspirin EC 81 MG tablet Take 81  mg by mouth at bedtime.     Yes Historical Provider, MD  bisoprolol-hydrochlorothiazide (ZIAC) 5-6.25 MG per tablet Take 1 tablet by mouth at bedtime.     Yes Historical Provider, MD  calcium-vitamin D (OSCAL WITH D) 500-200 MG-UNIT per tablet Take 1 tablet by mouth daily.     Yes Historical Provider, MD  PREVACID 24HR 15 MG capsule TAKE ONE CAPSULE DAILY. 12/03/10  Yes Gerrit Halls, NP  simvastatin (ZOCOR) 40 MG tablet Take 40 mg by mouth at bedtime.     Yes Historical Provider, MD  vitamin B-12 (CYANOCOBALAMIN) 100 MCG tablet Take 50 mcg by mouth daily.     Yes Historical Provider, MD  HYDROcodone-acetaminophen (NORCO) 5-325 MG per tablet Take 1 tablet by mouth every 6 (six) hours as needed. For pain     Historical Provider, MD  losartan (COZAAR) 100 MG tablet Take 100 mg by mouth daily.      Historical Provider, MD    Current Facility-Administered Medications  Medication Dose Route Frequency Provider Last Rate Last Dose  . 0.9 %  sodium chloride infusion   Intravenous Once Gavin Pound. Ghim, MD 75 mL/hr at 01/06/11 1849    . 0.9 %  sodium chloride infusion   Intravenous Continuous Arshad N. Kakrakandy 100 mL/hr at 01/07/11 0738    . acetaminophen (TYLENOL) tablet 650 mg  650 mg Oral Q6H PRN Eduard Clos       Or  . acetaminophen (TYLENOL) suppository 650 mg  650 mg Rectal Q6H PRN Eduard Clos      . aspirin EC tablet 81 mg  81 mg  Oral QHS Eduard Clos      . bisoprolol (ZEBETA) tablet 5 mg  5 mg Oral Daily Arshad N. Kakrakandy   5 mg at 01/07/11 1021  . calcium-vitamin D (OSCAL WITH D) 500-200 MG-UNIT per tablet 1 tablet  1 tablet Oral Daily Eduard Clos   1 tablet at 01/07/11 1021  . ciprofloxacin (CIPRO) IVPB 400 mg  400 mg Intravenous Once Gavin Pound. Ghim, MD 200 mL/hr at 01/06/11 2333 400 mg at 01/06/11 2333  . ciprofloxacin (CIPRO) IVPB 400 mg  400 mg Intravenous Q12H Mady Gemma, PHARMD   400 mg at 01/07/11 1022  . fentaNYL (SUBLIMAZE) 0.05 MG/ML injection  50 mcg  50 mcg Intravenous Once Gavin Pound. Ghim, MD   50 mcg at 01/06/11 1850  . HYDROcodone-acetaminophen (NORCO) 5-325 MG per tablet 1 tablet  1 tablet Oral Q6H PRN Eduard Clos   1 tablet at 01/07/11 0738  . HYDROmorphone (DILAUDID) injection 0.5 mg  0.5 mg Intravenous Q4H PRN Eduard Clos      . influenza  inactive virus vaccine (FLUZONE/FLUARIX) injection 0.5 mL  0.5 mL Intramuscular Once Arshad N. Kakrakandy   0.5 mL at 01/07/11 1053  . iohexol (OMNIPAQUE) 300 MG/ML injection 100 mL  100 mL Intravenous Once PRN Medication Radiologist   100 mL at 01/06/11 2206  . losartan (COZAAR) tablet 100 mg  100 mg Oral Daily Arshad N. Kakrakandy   100 mg at 01/07/11 1021  . metroNIDAZOLE (FLAGYL) IVPB 500 mg  500 mg Intravenous Once Gavin Pound. Ghim, MD 100 mL/hr at 01/06/11 2315 500 mg at 01/06/11 2315  . metroNIDAZOLE (FLAGYL) IVPB 500 mg  500 mg Intravenous Q8H Arshad N. Kakrakandy   500 mg at 01/07/11 0738  . morphine 4 MG/ML injection 4 mg  4 mg Intravenous Once Gavin Pound. Ghim, MD   4 mg at 01/06/11 2315  . ondansetron (ZOFRAN) injection 4 mg  4 mg Intravenous Once Gavin Pound. Ghim, MD   4 mg at 01/06/11 1849  . ondansetron (ZOFRAN) tablet 4 mg  4 mg Oral Q6H PRN Eduard Clos       Or  . ondansetron (ZOFRAN) injection 4 mg  4 mg Intravenous Q6H PRN Eduard Clos      . pantoprazole (PROTONIX) injection 40 mg  40 mg Intravenous Q24H Eduard Clos      . pneumococcal 23 valent vaccine (PNU-IMMUNE) injection 0.5 mL  0.5 mL Intramuscular Tomorrow-1000 Eduard Clos      . saccharomyces boulardii (FLORASTOR) capsule 250 mg  250 mg Oral BID Ripudeep Rai   250 mg at 01/07/11 1021  . simvastatin (ZOCOR) tablet 40 mg  40 mg Oral QHS Eduard Clos      . sodium chloride 0.9 % injection           . vitamin B-12 (CYANOCOBALAMIN) tablet 50 mcg  50 mcg Oral Daily Arshad N. Kakrakandy   50 mcg at 01/07/11 1022    Allergies as of 01/06/2011 - Review Complete  01/06/2011  Allergen Reaction Noted  . Penicillins    . Sulfonamide derivatives    . Tetracycline      Past Medical History  Diagnosis Date  . History of colon cancer      1996  . Coronary artery disease   . Myocardial infarction   . GERD (gastroesophageal reflux disease)   . DJD (degenerative joint disease)   . HTN (hypertension)   . Hyperlipidemia   .  Spinal stenosis     Past Surgical History  Procedure Date  . Abdominal hysterectomy   . Appendectomy   . Partial colectomy 1996    colon cancer  . Cardiac catheterization   . Back surgery   . Tonsillectomy   . Colonoscopy 01/2009    Dr. Bernita Buffy anastomosis at 18cm. Residual rectal and colonic mucosa appeared normal  . Esophagogastroduodenoscopy 01/2009    Dr. Carron Curie cervical esophageal web s/p dilation, small hh  . Esophagogastroduodenoscopy 11/2009    Dr. Ronni Rumble [facility]  . Colonoscopy 11/2009    Dr. Marlowe Alt papilla, surgical anastomosis at 18cm    Family History  Problem Relation Age of Onset  . Diabetes type II Other   . Kidney cancer Brother     History   Social History  . Marital Status: Widowed    Spouse Name: N/A    Number of Children: N/A  . Years of Education: N/A   Occupational History  . Not on file.   Social History Main Topics  . Smoking status: Never Smoker   . Smokeless tobacco: Not on file  . Alcohol Use: No  . Drug Use: No  . Sexually Active:    Other Topics Concern  . Not on file   Social History Narrative  . No narrative on file     ROS:  General: Negative for anorexia, fever, chills, weakness. Progressive weight loss. Complains of fatigue. Eyes: Negative for vision changes.  ENT: Negative for hoarseness, difficulty swallowing , nasal congestion. CV: Negative for chest pain, angina, palpitations, dyspnea on exertion, peripheral edema.  Respiratory: Negative for dyspnea at rest, dyspnea on exertion, cough, sputum, wheezing.  GI: See history of present  illness. GU:  Negative for dysuria, hematuria, urinary incontinence, urinary frequency, nocturnal urination.  MS: Negative for joint pain. Chronic back pain.  Derm: Negative for rash or itching.  Neuro: Negative for weakness, abnormal sensation, seizure, frequent headaches, memory loss, confusion.  Psych: Negative for anxiety, depression, suicidal ideation, hallucinations.  Endo: Progressive weight loss. Heme: Negative for bruising or bleeding. Allergy: Negative for rash or hives.       Physical Examination: Vital signs in last 24 hours: Temp:  [98.1 F (36.7 C)-99.6 F (37.6 C)] 98.8 F (37.1 C) (09/28 0400) Pulse Rate:  [66-97] 78  (09/28 0400) Resp:  [16] 16  (09/27 1954) BP: (99-137)/(46-59) 115/59 mmHg (09/28 0400) SpO2:  [95 %-96 %] 96 % (09/28 0400) Weight:  [102 lb 1.2 oz (46.3 kg)] 102 lb 1.2 oz (46.3 kg) (09/28 0220) Last BM Date: 01/07/11  General: Thin, elderly white female in no acute distress. Accompanied by her sister.  Head: Normocephalic, atraumatic.   Eyes: Conjunctiva pink, no icterus. Mouth: Oropharyngeal mucosa moist and pink , no lesions erythema or exudate. Neck: Supple without thyromegaly, masses, or lymphadenopathy.  Lungs: Clear to auscultation bilaterally.  Heart: Regular rate and rhythm, no murmurs rubs or gallops.  Abdomen: Bowel sounds are normal, moderate tenderness in the right lower quadrant and left lower quadrant, nondistended, no hepatosplenomegaly or masses, no abdominal bruits or    hernia , no rebound or guarding.   Rectal: Brown secretions. No formed stool present. No rectal mass. Extremities: No lower extremity edema, clubbing, deformity.  Neuro: Alert and oriented x 4 , grossly normal neurologically.  Skin: Warm and dry, no rash or jaundice.   Psych: Alert and cooperative, normal mood and affect.        Intake/Output from previous day: 09/27 0701 - 09/28 0700 In:  50 [I.V.:50] Out: 500 [Urine:500] Intake/Output this shift: Total  I/O In: 300 [IV Piggyback:300] Out: 501 [Urine:500; Stool:1]  Lab Results: CBC  Basename 01/07/11 0537 01/06/11 1825  WBC 12.5* 15.5*  HGB 11.3* 13.3  HCT 32.6* 39.0  MCV 96.7 97.0  PLT 176 212   BMET  Basename 01/07/11 0537 01/06/11 1825  NA 133* 134*  K 3.8 4.8  CL 100 96  CO2 25 27  GLUCOSE 116* 157*  BUN 19 25*  CREATININE 0.90 0.99  CALCIUM 8.7 10.2   LFT  Basename 01/07/11 0537 01/06/11 1825  BILITOT 0.8 0.8  BILIDIR -- 0.2  IBILI -- 0.6  ALKPHOS 190* 261*  AST 21 31  ALT 17 26  PROT 5.3* 7.1  ALBUMIN 2.7* 3.8   Imaging Studies: Ct Abdomen Pelvis W Contrast  01/06/2011  *RADIOLOGY REPORT*  Clinical Data: Abdominal pain and nausea.  History of colon cancer to  CT ABDOMEN AND PELVIS WITH CONTRAST  Technique:  Multidetector CT imaging of the abdomen and pelvis was performed following the standard protocol during bolus administration of intravenous contrast.  Contrast: OMNIPAQUE IOHEXOL 300 MG/ML IV SOLN  Comparison: CT abdomen pelvis 10/21/2009  Findings: Extensive micronodular tree in bud opacities at the lung bases are most prominent the right middle lobe and lingula, but also seen in the anterior right lower lobe.  These findings suggest chronic Mycobacterium Avium Intracellulare.  There is oral contrast within the distal esophagus raising the question of gastroesophageal reflux.  Coronary artery atherosclerosis is seen with normal heart size.  There is extensive bowel wall thickening and pericolonic fat stranding and pericolonic fluid involving the splenic flexure, descending colon, and proximal sigmoid colon,  consistent with acute colitis.  Again noted is chronic very prominent stool burden in the distal sigmoid colon and rectum.  Bowel suture is seen about the distal sigmoid colon, consistent with prior bowel surgery.  The rectum is distended with stool.  No evidence of rectal wall thickening.  Stomach is unremarkable.  Small bowel loops are within normal  limits for caliber.  There is no evidence of abscess or perforation.  The abdominal aorta is normal in caliber and patent and contains some scattered atherosclerotic calcification.  The celiac artery, superior mesenteric artery, and inferior mesenteric artery are patent and show no significant evidence of significant atherosclerotic narrowing.  The liver, gallbladder, spleen, adrenal glands, pancreas, and kidneys are stable show no new or significant finding.  Previously described stones in the upper pole of the right kidney not definitely seen on today's study.  The ureters are normal in caliber.  Appendix is not seen.  No evidence of acute appendicitis.  There are vertebroplasty changes at T12.  Grade 1 anterolisthesis of L1-L5 is stable.  Bones are osteopenic.  There are benign hemangiomas in all of the imaged vertebral bodies.  IMPRESSION:  1. Acute colitis involves the splenic flexure through the proximal sigmoid colon.  Considerations include infectious colitis, inflammatory bowel disease, or ischemic colitis. Please note that the patient had a similar appearing colitis involving the transverse colon and July 2011 2.  Chronic prominent stool burden/fecal impaction in the distal sigmoid colon and rectum, with postsurgical changes of the distal sigmoid colon noted. 3.  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  Original Report Authenticated By: Britta Mccreedy, M.D.      Impression: 77 year old female with acute on chronic abdominal pain, weight loss in the setting of spinal stenosis. Interestingly her  current CT scan shows colitis involving the splenic flexure to the proximal sigmoid colon and prominent stool burden/fecal impaction in the distal sigmoid colon and rectum. Postsurgical changes of the distal sigmoid colon noted. Similar findings were noted back in July 2011 at time of CT scan. Colonoscopy in August 2011 did not show any evidence of colitis.  Patient does not have diarrhea which would go against having left-sided colitis.  She also has progressive weight loss. Weight down 15 pounds in the last 2 years. Patient states her appetite is good but she doesn't eat regularly. No upper GI symptoms.  CT of the lung bases question Mycobacterium avium intracellularae infection.  Plan: #1 check stools for culture, C. difficile if develops diarrhea. #2 to discuss CT findings with Dr. Darrick Penna. These appear to be chronic findings, seen on CT 10/2009 but normal colonoscopy 11/2009. ?postsurgical changes or ischemic changes #3 Patient with ?Mycobacterium Avium Intracellularae infection seen in lung bases on CT A/P. Management per hospitalist.    GI covering ending at 5pm today and will resume 01/10/11 at 730am.   LOS: 1 day   Tana Coast  01/07/2011, 12:14 PM  CC: Dr. Assunta Found

## 2011-01-07 NOTE — Consult Note (Signed)
No nausea or vomiting. Pain improved.   Advance to full liquids. Disimpact pt. Tap water enemas 1 pr now and repeat on 9/29.

## 2011-01-08 LAB — BASIC METABOLIC PANEL
BUN: 13 mg/dL (ref 6–23)
CO2: 25 mEq/L (ref 19–32)
Calcium: 8.3 mg/dL — ABNORMAL LOW (ref 8.4–10.5)
GFR calc non Af Amer: >60 mL/min (ref >60)
Glucose, Bld: 96 mg/dL (ref 70–99)
Potassium: 3.8 mEq/L (ref 3.5–5.1)
Sodium: 136 mEq/L (ref 135–145)

## 2011-01-08 LAB — CBC
HCT: 30.8 % — ABNORMAL LOW (ref 36.0–46.0)
Hemoglobin: 10.5 g/dL — ABNORMAL LOW (ref 12.0–15.0)
MCH: 33.4 pg (ref 26.0–34.0)
MCHC: 34.1 g/dL (ref 30.0–36.0)
RBC: 3.14 MIL/uL — ABNORMAL LOW (ref 3.87–5.11)

## 2011-01-08 MED ORDER — SODIUM CHLORIDE 0.9 % IJ SOLN
INTRAMUSCULAR | Status: AC
  Filled 2011-01-08: qty 10

## 2011-01-08 MED ORDER — PANTOPRAZOLE SODIUM 40 MG PO TBEC
40.0000 mg | DELAYED_RELEASE_TABLET | Freq: Every day | ORAL | Status: DC
  Administered 2011-01-09 – 2011-01-10 (×2): 40 mg via ORAL
  Filled 2011-01-08 (×2): qty 1

## 2011-01-08 NOTE — Progress Notes (Signed)
Subjective: S: States abdominal pain is not improving still 5/10 in B/L lower quadrant, tolerating her liquid diet, no fever chills and no nausea or vomiting .   Objective: Weight change: 3.1 kg (6 lb 13.4 oz)  Intake/Output Summary (Last 24 hours) at 01/08/11 1133 Last data filed at 01/08/11 0800  Gross per 24 hour  Intake   2930 ml  Output   1151 ml  Net   1779 ml    Physical Exam: General: Alert and awake oriented x3 not in any acute distress. HEENT: anicteric sclera, pupils reactive to light and accommodation CVS: S1-S2 clear no murmur rubs or gallops Chest: clear to auscultation bilaterally, no wheezing rales or rhonchi Abdomen: soft, tender in b/l lower quadrants, nondistended, normal bowel sounds, no organomegaly Extremities: no cyanosis, clubbing or edema noted bilaterally Neuro: Cranial nerves II-XII intact, no focal neurological deficits  Lab Results:  Basename 01/08/11 0416 01/07/11 0538 01/07/11 0537  NA 136 -- 133*  K 3.8 -- 3.8  CL 105 -- 100  CO2 25 -- 25  GLUCOSE 96 -- 116*  BUN 13 -- 19  CREATININE 0.86 -- 0.90  CALCIUM 8.3* -- 8.7  MG -- 1.9 --  PHOS -- -- --    Basename 01/07/11 0537 01/06/11 1825  AST 21 31  ALT 17 26  ALKPHOS 190* 261*  BILITOT 0.8 0.8  PROT 5.3* 7.1  ALBUMIN 2.7* 3.8    Basename 01/06/11 1825  LIPASE 19  AMYLASE --    Basename 01/08/11 0416 01/07/11 0537 01/06/11 1825  WBC 8.8 12.5* --  NEUTROABS -- -- 13.8*  HGB 10.5* 11.3* --  HCT 30.8* 32.6* --  MCV 98.1 96.7 --  PLT 160 176 --   No results found for this basename: CKTOTAL:3,CKMB:3,CKMBINDEX:3,TROPONINI:3 in the last 72 hours No results found for this basename: POCBNP:3 in the last 72 hours No results found for this basename: DDIMER:2 in the last 72 hours No results found for this basename: HGBA1C:2 in the last 72 hours No results found for this basename: CHOL:2,HDL:2,LDLCALC:2,TRIG:2,CHOLHDL:2,LDLDIRECT:2 in the last 72 hours No results found for this  basename: TSH,T4TOTAL,FREET3,T3FREE,THYROIDAB in the last 72 hours No results found for this basename: VITAMINB12:2,FOLATE:2,FERRITIN:2,TIBC:2,IRON:2,RETICCTPCT:2 in the last 72 hours  Micro Results: Recent Results (from the past 240 hour(s))  MRSA PCR SCREENING     Status: Abnormal   Collection Time   01/07/11  2:59 AM      Component Value Range Status Comment   MRSA by PCR NEGATIVE (*) NEGATIVE  Final   CLOSTRIDIUM DIFFICILE BY PCR     Status: Normal   Collection Time   01/07/11  6:26 PM      Component Value Range Status Comment   C difficile by pcr NEGATIVE  NEGATIVE  Final     Studies/Results: Ct Abdomen Pelvis W Contrast  01/06/2011  *RADIOLOGY REPORT*  Clinical Data: Abdominal pain and nausea.  History of colon cancer to  CT ABDOMEN AND PELVIS WITH CONTRAST  Technique:  Multidetector CT imaging of the abdomen and pelvis was performed following the standard protocol during bolus administration of intravenous contrast.  Contrast: OMNIPAQUE IOHEXOL 300 MG/ML IV SOLN  Comparison: CT abdomen pelvis 10/21/2009  Findings: Extensive micronodular tree in bud opacities at the lung bases are most prominent the right middle lobe and lingula, but also seen in the anterior right lower lobe.  These findings suggest chronic Mycobacterium Avium Intracellulare.  There is oral contrast within the distal esophagus raising the question of gastroesophageal reflux.  Coronary artery atherosclerosis is seen with normal heart size.  There is extensive bowel wall thickening and pericolonic fat stranding and pericolonic fluid involving the splenic flexure, descending colon, and proximal sigmoid colon,  consistent with acute colitis.  Again noted is chronic very prominent stool burden in the distal sigmoid colon and rectum.  Bowel suture is seen about the distal sigmoid colon, consistent with prior bowel surgery.  The rectum is distended with stool.  No evidence of rectal wall thickening.  Stomach is unremarkable.   Small bowel loops are within normal limits for caliber.  There is no evidence of abscess or perforation.  The abdominal aorta is normal in caliber and patent and contains some scattered atherosclerotic calcification.  The celiac artery, superior mesenteric artery, and inferior mesenteric artery are patent and show no significant evidence of significant atherosclerotic narrowing.  The liver, gallbladder, spleen, adrenal glands, pancreas, and kidneys are stable show no new or significant finding.  Previously described stones in the upper pole of the right kidney not definitely seen on today's study.  The ureters are normal in caliber.  Appendix is not seen.  No evidence of acute appendicitis.  There are vertebroplasty changes at T12.  Grade 1 anterolisthesis of L1-L5 is stable.  Bones are osteopenic.  There are benign hemangiomas in all of the imaged vertebral bodies.  IMPRESSION:  1. Acute colitis involves the splenic flexure through the proximal sigmoid colon.  Considerations include infectious colitis, inflammatory bowel disease, or ischemic colitis. Please note that the patient had a similar appearing colitis involving the transverse colon and July 2011 2.  Chronic prominent stool burden/fecal impaction in the distal sigmoid colon and rectum, with postsurgical changes of the distal sigmoid colon noted. 3.  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  Original Report Authenticated By: Britta Mccreedy, M.D.   Medications: Scheduled Meds:    . aspirin EC  81 mg Oral QHS  . bisoprolol  5 mg Oral Daily  . calcium-vitamin D  1 tablet Oral Daily  . ciprofloxacin  400 mg Intravenous Q12H  . losartan  100 mg Oral Daily  . metronidazole  500 mg Intravenous Q8H  . pantoprazole  40 mg Oral Q1200  . pneumococcal 23 valent vaccine  0.5 mL Intramuscular Tomorrow-1000  . saccharomyces boulardii  250 mg Oral BID  . simvastatin  40 mg Oral QHS  . sodium chloride        . sodium chloride      . sodium chloride      . sodium chloride      . vitamin B-12  50 mcg Oral Daily  . DISCONTD: pantoprazole (PROTONIX) IV  40 mg Intravenous Q24H   Continuous Infusions:    . sodium chloride 100 mL/hr at 01/08/11 0400   PRN Meds:.acetaminophen, acetaminophen, HYDROcodone-acetaminophen, HYDROmorphone, ondansetron (ZOFRAN) IV, ondansetron  Assessment/Plan: Principal Problem:  *COLITIS: C diff negative - Continue ciprofloxacin and Flagyl,  IV fluids, antiemetics, diet advance to full liquids and advance as tolerated - Appreciate GI following, patient has one prior episode of colitis in June 2011.   CAROTID ARTERY STENOSIS, BILATERAL: Stable    hypertension stable   PUD, HX OF: Continue proton pump inhibitor  questionable Mycobacterium chronic lung infection: Patient will likely need outpatient pulmonary workup or a bronchoscopy for the diagnosis, she is not having any pulmonary symptoms at present.  prophylaxis bilateral SCDs  Disposition: transfer to the floor, advance diet today, hopefully DC in a.m. if  medically stable and tolerating diet.     LOS: 2 days   RAI,RIPUDEEP 01/08/2011, 11:33 AM

## 2011-01-08 NOTE — Progress Notes (Signed)
Report given to Lyda Jester RN. Pt stable and A&O at this time, taken via wheelchair.

## 2011-01-08 NOTE — Progress Notes (Signed)

## 2011-01-09 LAB — GLUCOSE, CAPILLARY
Glucose-Capillary: 105 mg/dL — ABNORMAL HIGH (ref 70–99)
Glucose-Capillary: 147 mg/dL — ABNORMAL HIGH (ref 70–99)
Glucose-Capillary: 147 mg/dL — ABNORMAL HIGH (ref 70–99)

## 2011-01-09 LAB — RETICULOCYTES
RBC.: 3.35 MIL/uL — ABNORMAL LOW (ref 3.87–5.11)
Retic Count, Absolute: 57 10*3/uL (ref 19.0–186.0)

## 2011-01-09 NOTE — Progress Notes (Signed)
Subjective: Patient states abdominal pain has improved. Patient is tolerating a full liquid diet patient denies any nausea or vomiting.  Objective: Vital signs in last 24 hours: Filed Vitals:   01/08/11 0800 01/08/11 0828 01/08/11 2058 01/09/11 0615  BP: 104/67  109/67 118/53  Pulse: 93 91 87 80  Temp: 97.6 F (36.4 C)  98.3 F (36.8 C) 97.5 F (36.4 C)  TempSrc: Oral     Resp: 20 17 20 20   Height:      Weight:      SpO2: 96% 97% 98% 96%    Intake/Output Summary (Last 24 hours) at 01/09/11 1210 Last data filed at 01/09/11 1610  Gross per 24 hour  Intake 3261.67 ml  Output    302 ml  Net 2959.67 ml    Weight change:   General: Alert, awake, oriented x3, in no acute distress. HEENT: No bruits, no goiter. Heart: Regular rate and rhythm, without murmurs, rubs, gallops. Lungs: Clear to auscultation bilaterally. Abdomen: Soft, decreased tenderness to palpation in the epigastric region, nondistended, positive bowel sounds. Extremities: No clubbing cyanosis or edema with positive pedal pulses. Neuro: Grossly intact, nonfocal.    Lab Results: Results for orders placed during the hospital encounter of 01/06/11 (from the past 48 hour(s))  CLOSTRIDIUM DIFFICILE BY PCR     Status: Normal   Collection Time   01/07/11  6:26 PM      Component Value Range Comment   C difficile by pcr NEGATIVE  NEGATIVE    CBC     Status: Abnormal   Collection Time   01/08/11  4:16 AM      Component Value Range Comment   WBC 8.8  4.0 - 10.5 (K/uL)    RBC 3.14 (*) 3.87 - 5.11 (MIL/uL)    Hemoglobin 10.5 (*) 12.0 - 15.0 (g/dL)    HCT 96.0 (*) 45.4 - 46.0 (%)    MCV 98.1  78.0 - 100.0 (fL)    MCH 33.4  26.0 - 34.0 (pg)    MCHC 34.1  30.0 - 36.0 (g/dL)    RDW 09.8  11.9 - 14.7 (%)    Platelets 160  150 - 400 (K/uL)   BASIC METABOLIC PANEL     Status: Abnormal   Collection Time   01/08/11  4:16 AM      Component Value Range Comment   Sodium 136  135 - 145 (mEq/L)    Potassium 3.8  3.5 - 5.1  (mEq/L)    Chloride 105  96 - 112 (mEq/L)    CO2 25  19 - 32 (mEq/L)    Glucose, Bld 96  70 - 99 (mg/dL)    BUN 13  6 - 23 (mg/dL)    Creatinine, Ser 8.29  0.50 - 1.10 (mg/dL)    Calcium 8.3 (*) 8.4 - 10.5 (mg/dL)    GFR calc non Af Amer >60  >60 (mL/min)    GFR calc Af Amer >60  >60 (mL/min)   GLUCOSE, CAPILLARY     Status: Abnormal   Collection Time   01/08/11  9:00 PM      Component Value Range Comment   Glucose-Capillary 131 (*) 70 - 99 (mg/dL)   GLUCOSE, CAPILLARY     Status: Abnormal   Collection Time   01/09/11  7:23 AM      Component Value Range Comment   Glucose-Capillary 105 (*) 70 - 99 (mg/dL)    Comment 1 Documented in Chart      Comment 2  Notify RN     GLUCOSE, CAPILLARY     Status: Abnormal   Collection Time   01/09/11 11:34 AM      Component Value Range Comment   Glucose-Capillary 147 (*) 70 - 99 (mg/dL)    Comment 1 Notify RN      Comment 2 Documented in Chart         Micro: Recent Results (from the past 240 hour(s))  MRSA PCR SCREENING     Status: Abnormal   Collection Time   01/07/11  2:59 AM      Component Value Range Status Comment   MRSA by PCR NEGATIVE (*) NEGATIVE  Final   CLOSTRIDIUM DIFFICILE BY PCR     Status: Normal   Collection Time   01/07/11  6:26 PM      Component Value Range Status Comment   C difficile by pcr NEGATIVE  NEGATIVE  Final     Studies/Results: No results found.  Medications:     . aspirin EC  81 mg Oral QHS  . bisoprolol  5 mg Oral Daily  . calcium-vitamin D  1 tablet Oral Daily  . ciprofloxacin  400 mg Intravenous Q12H  . losartan  100 mg Oral Daily  . metronidazole  500 mg Intravenous Q8H  . pantoprazole  40 mg Oral Q1200  . pneumococcal 23 valent vaccine  0.5 mL Intramuscular Tomorrow-1000  . saccharomyces boulardii  250 mg Oral BID  . simvastatin  40 mg Oral QHS  . sodium chloride      . vitamin B-12  50 mcg Oral Daily     Assessment/plan Principal Problem:  *COLITIS Active Problems:  CAROTID ARTERY  STENOSIS, BILATERAL  PUD, HX OF  Anemia      #1 colitis-clinical improvement patient denies any nausea or vomiting. Abdominal pain has improved. Continue IV ciprofloxacin day #2 and IV Flagyl day #2 patient tolerating full liquid diets. Will advance diet to mechanical soft low residue diet. Continue supportive care with IV fluids and antiemetics. #2 anemia likely secondary to problem #1. Will check an anemia panel will follow H&H per gastroenterology. continue PPI #3 history of peptic ulcer disease continue Protonix. #4 carotid artery stenosis bilaterally stable.  #5 prophylaxis- PPI  for GI prophylaxis SCDs for DVT prophylaxis    LOS: 3 days   Mianna Iezzi 01/09/2011, 12:10 PM

## 2011-01-09 NOTE — Progress Notes (Signed)
ANTIBIOTIC CONSULT NOTE Pharmacy Consult for Cipro Indication: Colitis  Allergies  Allergen Reactions  . Penicillins     REACTION: Unknown reaction  . Sulfonamide Derivatives     REACTION: Unknown reaction  . Tetracycline     REACTION: Unknown reaction    Patient Measurements: Height: 5\' 5"  (165.1 cm) Weight: 108 lb 14.5 oz (49.4 kg) IBW/kg (Calculated) : 57   Vital Signs: Temp: 97.5 F (36.4 C) (09/30 0615) BP: 118/53 mmHg (09/30 0615) Pulse Rate: 80  (09/30 0615) Intake/Output from previous day: 09/29 0701 - 09/30 0700 In: 4341.7 [P.O.:680; I.V.:2661.7; IV Piggyback:1000] Out: 502 [Urine:502] Intake/Output from this shift:    Labs:  Basename 01/08/11 0416 01/07/11 0537 01/06/11 1825  WBC 8.8 12.5* 15.5*  HGB 10.5* 11.3* 13.3  PLT 160 176 212  LABCREA -- -- --  CREATININE 0.86 0.90 0.99  CRCLEARANCE -- -- --  Microbiology: Recent Results (from the past 720 hour(s))  MRSA PCR SCREENING     Status: Abnormal   Collection Time   01/07/11  2:59 AM      Component Value Range Status Comment   MRSA by PCR NEGATIVE (*) NEGATIVE  Final   CLOSTRIDIUM DIFFICILE BY PCR     Status: Normal   Collection Time   01/07/11  6:26 PM      Component Value Range Status Comment   C difficile by pcr NEGATIVE  NEGATIVE  Final    Estimated Creatinine Clearance: 38.7 ml/min (by C-G formula based on Cr of 0.86).  Assessment: Dose stable for age,weight,renal function and indication.  Goal of Therapy:  Appropriate Dosing  Plan: ContinueCipro 400mg  IV every 12 hours. Sign off. Lamonte Richer R 01/09/2011,10:10 AM

## 2011-01-10 ENCOUNTER — Encounter: Payer: Self-pay | Admitting: Internal Medicine

## 2011-01-10 DIAGNOSIS — K5289 Other specified noninfective gastroenteritis and colitis: Secondary | ICD-10-CM

## 2011-01-10 DIAGNOSIS — R911 Solitary pulmonary nodule: Secondary | ICD-10-CM | POA: Insufficient documentation

## 2011-01-10 DIAGNOSIS — D649 Anemia, unspecified: Secondary | ICD-10-CM

## 2011-01-10 DIAGNOSIS — R634 Abnormal weight loss: Secondary | ICD-10-CM

## 2011-01-10 HISTORY — DX: Solitary pulmonary nodule: R91.1

## 2011-01-10 LAB — DIFFERENTIAL
Basophils Absolute: 0 10*3/uL (ref 0.0–0.1)
Eosinophils Relative: 2 % (ref 0–5)
Lymphocytes Relative: 16 % (ref 12–46)
Lymphs Abs: 0.8 10*3/uL (ref 0.7–4.0)
Neutro Abs: 3.5 10*3/uL (ref 1.7–7.7)
Neutrophils Relative %: 69 % (ref 43–77)

## 2011-01-10 LAB — BASIC METABOLIC PANEL
Calcium: 8 mg/dL — ABNORMAL LOW (ref 8.4–10.5)
Creatinine, Ser: 0.74 mg/dL (ref 0.50–1.10)
GFR calc Af Amer: 89 mL/min — ABNORMAL LOW (ref >90)
GFR calc non Af Amer: 77 mL/min — ABNORMAL LOW (ref >90)
Sodium: 139 mEq/L (ref 135–145)

## 2011-01-10 LAB — CBC
Platelets: 163 10*3/uL (ref 150–400)
RBC: 2.86 MIL/uL — ABNORMAL LOW (ref 3.87–5.11)
RDW: 13.6 % (ref 11.5–15.5)
WBC: 5 10*3/uL (ref 4.0–10.5)

## 2011-01-10 LAB — FERRITIN: Ferritin: 202 ng/mL (ref 10–291)

## 2011-01-10 LAB — IRON AND TIBC
Iron: 48 ug/dL (ref 42–135)
UIBC: 176 ug/dL (ref 125–400)

## 2011-01-10 MED ORDER — CIPROFLOXACIN HCL 500 MG PO TABS: 500.0000 mg | ORAL_TABLET | Freq: Two times a day (BID) | ORAL | Status: DC

## 2011-01-10 MED ORDER — SACCHAROMYCES BOULARDII 250 MG PO CAPS
250.0000 mg | ORAL_CAPSULE | Freq: Two times a day (BID) | ORAL | Status: AC
Start: 2011-01-10 — End: 2011-02-10

## 2011-01-10 MED ORDER — SACCHAROMYCES BOULARDII 250 MG PO CAPS: 250.0000 mg | ORAL_CAPSULE | Freq: Two times a day (BID) | ORAL | Status: DC

## 2011-01-10 MED ORDER — METRONIDAZOLE 500 MG PO TABS: 500.0000 mg | ORAL_TABLET | Freq: Three times a day (TID) | ORAL | Status: AC

## 2011-01-10 MED ORDER — METRONIDAZOLE 500 MG PO TABS: 500.0000 mg | ORAL_TABLET | Freq: Three times a day (TID) | ORAL | Status: DC

## 2011-01-10 MED ORDER — CIPROFLOXACIN HCL 500 MG PO TABS: 500.0000 mg | ORAL_TABLET | Freq: Two times a day (BID) | ORAL | Status: AC

## 2011-01-10 MED ORDER — CIPROFLOXACIN HCL 250 MG PO TABS: 500.0000 mg | ORAL_TABLET | Freq: Two times a day (BID) | ORAL | Status: DC

## 2011-01-10 NOTE — Progress Notes (Signed)
Subjective: Denies any abdominal pain, nausea, vomiting, diarrhea or fever.  Stool cx & c diff negative.  Hemoccult positive.  States she feels much better & ready to go home.  Objective: Vital signs in last 24 hours: Temp:  [97.7 F (36.5 C)-98.4 F (36.9 C)] 97.7 F (36.5 C) (10/01 0535) Pulse Rate:  [79-86] 79  (10/01 0535) Resp:  [18-20] 20  (10/01 0535) BP: (107-129)/(64-72) 127/72 mmHg (10/01 0535) SpO2:  [95 %-98 %] 98 % (10/01 0535) Last BM Date: 01/07/11 General:   Alert,  Well-developed, thin, pleasant and cooperative in NAD.  Accompanied by her daughter. Head:  Normocephalic and atraumatic. Eyes:  Sclera clear, no icterus.   Conjunctiva pink. Mouth:  No deformity or lesions, OP pink/moist. Neck:  Supple; no masses or thyromegaly. Heart:  Regular rate and rhythm; no murmurs, clicks, rubs,  or gallops. Abdomen:  Soft, nontender and nondistended. No masses, hepatosplenomegaly or hernias noted. Normal bowel sounds, without guarding, and without rebound.   Pulses:  Normal pulses noted. Extremities:  Without edema. Neurologic:  Alert and  oriented x4;  grossly normal neurologically. Skin:  Intact without significant lesions or rashes. Psych:  Alert and cooperative. Normal mood and affect.  Intake/Output from previous day: 09/30 0701 - 10/01 0700 In: 3628.3 [P.O.:980; I.V.:1948.3; IV Piggyback:700] Out: 600 [Urine:600] Intake/Output this shift:    Lab Results:  Basename 01/10/11 0556 01/08/11 0416  WBC 5.0 8.8  HGB 9.4* 10.5*  HCT 28.2* 30.8*  PLT 163 160   BMET  Basename 01/10/11 0556 01/08/11 0416  NA 139 136  K 4.0 3.8  CL 111 105  CO2 26 25  GLUCOSE 109* 96  BUN 5* 13  CREATININE 0.74 0.86  CALCIUM 8.0* 8.3*   Assessment: 1) Colitis:  Splenic flexure to prox sigmoid.  Similar episode 10/2009; benign colonoscopy 11/2009.  Stools benign.  ? Ischemia. 2) Wt loss:  Unintentional 3)  Anemia:  Panel pending.  Plan: 1) FU anemia panel 2) Complete 5 day  course of empiric cipro/flagyl.  May change to po.   3) Probiotic of choice (Florastor or Align) daily x at least 1 month 4) Outpatient appt w/ Dr Jena Gauss to address wt loss-We will arrange.  LOS: 4 days   Lorenza Burton  01/10/2011, 9:52 AM

## 2011-01-10 NOTE — Discharge Summary (Signed)
Physician Discharge Summary  CORYNN SOLBERG MRN: [id number] DOB/AGE: [birth year] 77 y.o.  PCP: Caffie Damme, RN, Nursing/RN Coord   Admit date: 01/06/2011 Discharge date: 01/10/2011  Discharge Diagnoses:   Principal Problem:  COLITIS  Abn CT scan findings of lung bases  CAROTID ARTERY STENOSIS, BILATERAL  PUD, HX OF  Anemia   Current Discharge Medication List    START taking these medications   Details  ciprofloxacin (CIPRO) 500 MG tablet Take 1 tablet (500 mg total) by mouth 2 (two) times daily. Qty: 10 tablet, Refills: 0    metroNIDAZOLE (FLAGYL) 500 MG tablet Take 1 tablet (500 mg total) by mouth 3 (three) times daily. Qty: 15 tablet, Refills: 0    saccharomyces boulardii (FLORASTOR) 250 MG capsule Take 1 capsule (250 mg total) by mouth 2 (two) times daily. Qty: 62 capsule, Refills: 0      CONTINUE these medications which have NOT CHANGED   Details  aspirin EC 81 MG tablet Take 81 mg by mouth at bedtime.      bisoprolol-hydrochlorothiazide (ZIAC) 5-6.25 MG per tablet Take 1 tablet by mouth at bedtime.      calcium-vitamin D (OSCAL WITH D) 500-200 MG-UNIT per tablet Take 1 tablet by mouth daily.      PREVACID 24HR 15 MG capsule TAKE ONE CAPSULE DAILY. Qty: 31 capsule, Refills: 3    simvastatin (ZOCOR) 40 MG tablet Take 40 mg by mouth at bedtime.      vitamin B-12 (CYANOCOBALAMIN) 100 MCG tablet Take 50 mcg by mouth daily.      HYDROcodone-acetaminophen (NORCO) 5-325 MG per tablet Take 1 tablet by mouth every 6 (six) hours as needed. For pain     losartan (COZAAR) 100 MG tablet Take 100 mg by mouth daily.          Discharge Condition: Improved.  Disposition:  discharged home. Patient is to followup with Dr. Jena Gauss of gastroenterology in one week, as well as with PCP. Patient will need a CBC done to followup on her hemoglobin. With patient's PCP patient will need further workup on the abnormal CT scan findings of the lung bases and may  benefit from a referral to a pulmonologist for possible bronchoscopy.   Consults: Gastroenterology; Dr Darrick Penna   Significant Diagnostic Studies: Ct Abdomen Pelvis W Contrast  01/06/2011  *RADIOLOGY REPORT*   IMPRESSION:  1. Acute colitis involves the splenic flexure through the proximal sigmoid colon.  Considerations include infectious colitis, inflammatory bowel disease, or ischemic colitis. Please note that the patient had a similar appearing colitis involving the transverse colon and July 2011 2.  Chronic prominent stool burden/fecal impaction in the distal sigmoid colon and rectum, with postsurgical changes of the distal sigmoid colon noted. 3.  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  Original Report Authenticated By: Britta Mccreedy, M.D.     Microbiology: Recent Results (from the past 240 hour(s))  MRSA PCR SCREENING     Status: Abnormal   Collection Time   01/07/11  2:59 AM      Component Value Range Status Comment   MRSA by PCR NEGATIVE (*) NEGATIVE  Final   STOOL CULTURE     Status: Normal (Preliminary result)   Collection Time   01/07/11  6:26 PM      Component Value Range Status Comment   Specimen Description STOOL   Final    Special Requests NONE   Final    Culture NO SUSPICIOUS COLONIES,  CONTINUING TO HOLD   Final    Report Status PENDING   Incomplete   CLOSTRIDIUM DIFFICILE BY PCR     Status: Normal   Collection Time   01/07/11  6:26 PM      Component Value Range Status Comment   C difficile by pcr NEGATIVE  NEGATIVE  Final      Labs: Results for orders placed during the hospital encounter of 01/06/11 (from the past 48 hour(s))  GLUCOSE, CAPILLARY     Status: Abnormal   Collection Time   01/08/11  9:00 PM      Component Value Range Comment   Glucose-Capillary 131 (*) 70 - 99 (mg/dL)   GLUCOSE, CAPILLARY     Status: Abnormal   Collection Time   01/09/11  7:23 AM      Component Value Range Comment    Glucose-Capillary 105 (*) 70 - 99 (mg/dL)    Comment 1 Documented in Chart      Comment 2 Notify RN     GLUCOSE, CAPILLARY     Status: Abnormal   Collection Time   01/09/11 11:34 AM      Component Value Range Comment   Glucose-Capillary 147 (*) 70 - 99 (mg/dL)    Comment 1 Notify RN      Comment 2 Documented in Chart     RETICULOCYTES     Status: Abnormal   Collection Time   01/09/11  4:19 PM      Component Value Range Comment   Retic Ct Pct 1.7  0.4 - 3.1 (%)    RBC. 3.35 (*) 3.87 - 5.11 (MIL/uL)    Retic Count, Manual 57.0  19.0 - 186.0 (K/uL)   GLUCOSE, CAPILLARY     Status: Abnormal   Collection Time   01/09/11  4:24 PM      Component Value Range Comment   Glucose-Capillary 147 (*) 70 - 99 (mg/dL)    Comment 1 Notify RN      Comment 2 Documented in Chart     BASIC METABOLIC PANEL     Status: Abnormal   Collection Time   01/10/11  5:56 AM      Component Value Range Comment   Sodium 139  135 - 145 (mEq/L)    Potassium 4.0  3.5 - 5.1 (mEq/L)    Chloride 111  96 - 112 (mEq/L)    CO2 26  19 - 32 (mEq/L)    Glucose, Bld 109 (*) 70 - 99 (mg/dL)    BUN 5 (*) 6 - 23 (mg/dL)    Creatinine, Ser 3.22  0.50 - 1.10 (mg/dL)    Calcium 8.0 (*) 8.4 - 10.5 (mg/dL)    GFR calc non Af Amer 77 (*) >90 (mL/min)    GFR calc Af Amer 89 (*) >90 (mL/min)   CBC     Status: Abnormal   Collection Time   01/10/11  5:56 AM      Component Value Range Comment   WBC 5.0  4.0 - 10.5 (K/uL)    RBC 2.86 (*) 3.87 - 5.11 (MIL/uL)    Hemoglobin 9.4 (*) 12.0 - 15.0 (g/dL)    HCT 02.5 (*) 42.7 - 46.0 (%)    MCV 98.6  78.0 - 100.0 (fL)    MCH 32.9  26.0 - 34.0 (pg)    MCHC 33.3  30.0 - 36.0 (g/dL)    RDW 06.2  37.6 - 28.3 (%)    Platelets 163  150 - 400 (K/uL)  DIFFERENTIAL     Status: Normal   Collection Time   01/10/11  5:56 AM      Component Value Range Comment   Neutrophils Relative 69  43 - 77 (%)    Neutro Abs 3.5  1.7 - 7.7 (K/uL)    Lymphocytes Relative 16  12 - 46 (%)    Lymphs Abs 0.8  0.7 -  4.0 (K/uL)    Monocytes Relative 12  3 - 12 (%)    Monocytes Absolute 0.6  0.1 - 1.0 (K/uL)    Eosinophils Relative 2  0 - 5 (%)    Eosinophils Absolute 0.1  0.0 - 0.7 (K/uL)    Basophils Relative 0  0 - 1 (%)    Basophils Absolute 0.0  0.0 - 0.1 (K/uL)      HPI : 77 year old female with H/O CAD S/P stent, H/O CA Colon S/P surgery in 96, presents with abdominal and nausea over the last 24 hrs. The pain is mainly in the epigastric area and left upper quadrant and has become constant. Patient has nausea but no vomting or diarrhea. Has not used any antibiotics recently. In the ER patient had CT abdomen and pelvis showing colitis involoving the splenic flexure area and sigmoid patient is admitted for further managent. Patient had similar symptoms a year ago and at that time CT showed transverse colon colitis and had colonoscopy with Dr Kendell Bane later and per family was unremarkable.  For the rest of admission history and physical please see H&P dictated by Dr. Toniann Fail.  HOSPITAL COURSE:   1. Colitis-admitted to the regular floor bed. Placed empirically on IV ciprofloxacin as well as IV Flagyl. Placed on IV fluids as IV antibiotics and needs a gastroenterology consult was obtained. Was made n.p.o. except medications on day one and she was monitored. Lactic acid level was also obtained which came back elevated at 2.3. Patient's CBC had a  leukocytosis with a white count of 14.5.  C Diff PCR which was obtained came back negative. Seen by gastroenterology, Dr. Darrick Penna and it was recommended to continue the current treatment patient was on the patient was monitored and followed throughout the hospitalization.  Patient improved clinically and was subsequently started on a clear liquid diet clear liquid diet and her diet was slowly advanced to a full liquid diet and then a mechanical soft diet which patient tolerated. Symptomatically during the hospitalization the patient will be transitioned to oral  ciprofloxacin and Flagyl for 5 more days to complete a one-week course of antibiotic therapy. Follow up with a gastroenterologist one week post discharge. Patient will be discharged in stable and improved condition. 2. Abnormal CT scan findings of the lung bases- CT scan which was done to assess the patient's abdominal pain did show some abnormal lung findings in the bases with questionable Mycobacterium chronic lung infection. Pt remained asymptomatic pulmonary wise and will need to followup with PCP as outpatient.  Patient would likely benefit from a referral to a pulmonologist for possible  bronchoscopy and further evaluation of this. Her chronic medical issues remained stable throughout the hospitalization the patient will be  discharged in a stable and improved condition, with  follow up as stated above.    Discharge Exam:  Blood pressure 127/72, pulse 79, temperature 97.7 F (36.5 C), temperature source Oral, resp. rate 20, height 5\' 5"  (1.651 m), weight 49.4 kg (108 lb 14.5 oz), SpO2 98.00%.  General: Alert, awake, oriented x3, in no acute distress. HEENT:  No bruits, no goiter. Heart: Regular rate and rhythm, without murmurs, rubs, gallops. Lungs: Clear to auscultation bilaterally. Abdomen: Soft, nontender, nondistended, positive bowel sounds. Extremities: No clubbing cyanosis or edema with positive pedal pulses. Neuro: Grossly intact, nonfocal.     Discharge Orders    Future Appointments: Provider: Department: Dept Phone: Center:   01/24/2011 3:30 PM Corbin Ade, MD [facility] [phone] [facility]      Follow-up Information    Follow up with Eula Listen, MD. Call in 1 week.   Contact information:   [address] [place] [address] [phone]       Follow up with Caffie Damme, RN. Call in 1 week.   Contact information:   [address] [place] [address] [phone]           Signed: Ramiro Harvest 01/10/2011, 2:08 PM

## 2011-01-10 NOTE — Progress Notes (Signed)
Pt admitted with abd pain and anorexia. CT in ED showed colitis and large stool burden in distal colon and rectum near area of anastomosis and no diverticula seen in the inflamed colon. Pt most likey had ischemic colitis. Last TCS 2011Pt improved after CIP/FLAG and BMs. Still has soreness on her left side. No BRBPR or melena. Spoke with Dr. Dietrich Pates and will reassess need to adjust meds to keep SBP > 110 to hopefully decrease her risk for ischemic colitis. OPV in 1-2 mos with Dr. Jena Gauss, RE: anemia and colitis. Complete CIP/FLAG.

## 2011-01-10 NOTE — Progress Notes (Signed)
Subjective: Abdominal pain has improved. Patient is tolerating current meals/diet. He denies any diarrhea. Pt wants to go home.  Objective: Vital signs in last 24 hours: Filed Vitals:   01/09/11 0615 01/09/11 1403 01/09/11 2200 01/10/11 0535  BP: 118/53 107/64 129/67 127/72  Pulse: 80 84 86 79  Temp: 97.5 F (36.4 C) 97.9 F (36.6 C) 98.4 F (36.9 C) 97.7 F (36.5 C)  TempSrc:  Oral Oral Oral  Resp: 20 18 20 20   Height:      Weight:      SpO2: 96% 97% 95% 98%    Intake/Output Summary (Last 24 hours) at 01/10/11 1349 Last data filed at 01/10/11 0639  Gross per 24 hour  Intake 3128.33 ml  Output    600 ml  Net 2528.33 ml    Weight change:   General: Alert, awake, oriented x3, in no acute distress. HEENT: No bruits, no goiter. Heart: Regular rate and rhythm, without murmurs, rubs, gallops. Lungs: Clear to auscultation bilaterally. Abdomen: Soft, nontender, nondistended, positive bowel sounds. Extremities: No clubbing cyanosis or edema with positive pedal pulses. Neuro: Grossly intact, nonfocal.    Lab Results: Results for orders placed during the hospital encounter of 01/06/11 (from the past 24 hour(s))  RETICULOCYTES     Status: Abnormal   Collection Time   01/09/11  4:19 PM      Component Value Range   Retic Ct Pct 1.7  0.4 - 3.1 (%)   RBC. 3.35 (*) 3.87 - 5.11 (MIL/uL)   Retic Count, Manual 57.0  19.0 - 186.0 (K/uL)  GLUCOSE, CAPILLARY     Status: Abnormal   Collection Time   01/09/11  4:24 PM      Component Value Range   Glucose-Capillary 147 (*) 70 - 99 (mg/dL)   Comment 1 Notify RN     Comment 2 Documented in Chart    BASIC METABOLIC PANEL     Status: Abnormal   Collection Time   01/10/11  5:56 AM      Component Value Range   Sodium 139  135 - 145 (mEq/L)   Potassium 4.0  3.5 - 5.1 (mEq/L)   Chloride 111  96 - 112 (mEq/L)   CO2 26  19 - 32 (mEq/L)   Glucose, Bld 109 (*) 70 - 99 (mg/dL)   BUN 5 (*) 6 - 23 (mg/dL)   Creatinine, Ser 4.09  0.50 - 1.10  (mg/dL)   Calcium 8.0 (*) 8.4 - 10.5 (mg/dL)   GFR calc non Af Amer 77 (*) >90 (mL/min)   GFR calc Af Amer 89 (*) >90 (mL/min)  CBC     Status: Abnormal   Collection Time   01/10/11  5:56 AM      Component Value Range   WBC 5.0  4.0 - 10.5 (K/uL)   RBC 2.86 (*) 3.87 - 5.11 (MIL/uL)   Hemoglobin 9.4 (*) 12.0 - 15.0 (g/dL)   HCT 81.1 (*) 91.4 - 46.0 (%)   MCV 98.6  78.0 - 100.0 (fL)   MCH 32.9  26.0 - 34.0 (pg)   MCHC 33.3  30.0 - 36.0 (g/dL)   RDW 78.2  95.6 - 21.3 (%)   Platelets 163  150 - 400 (K/uL)  DIFFERENTIAL     Status: Normal   Collection Time   01/10/11  5:56 AM      Component Value Range   Neutrophils Relative 69  43 - 77 (%)   Neutro Abs 3.5  1.7 - 7.7 (K/uL)  Lymphocytes Relative 16  12 - 46 (%)   Lymphs Abs 0.8  0.7 - 4.0 (K/uL)   Monocytes Relative 12  3 - 12 (%)   Monocytes Absolute 0.6  0.1 - 1.0 (K/uL)   Eosinophils Relative 2  0 - 5 (%)   Eosinophils Absolute 0.1  0.0 - 0.7 (K/uL)   Basophils Relative 0  0 - 1 (%)   Basophils Absolute 0.0  0.0 - 0.1 (K/uL)     Micro: Recent Results (from the past 240 hour(s))  MRSA PCR SCREENING     Status: Abnormal   Collection Time   01/07/11  2:59 AM      Component Value Range Status Comment   MRSA by PCR NEGATIVE (*) NEGATIVE  Final   STOOL CULTURE     Status: Normal (Preliminary result)   Collection Time   01/07/11  6:26 PM      Component Value Range Status Comment   Specimen Description STOOL   Final    Special Requests NONE   Final    Culture NO SUSPICIOUS COLONIES, CONTINUING TO HOLD   Final    Report Status PENDING   Incomplete   CLOSTRIDIUM DIFFICILE BY PCR     Status: Normal   Collection Time   01/07/11  6:26 PM      Component Value Range Status Comment   C difficile by pcr NEGATIVE  NEGATIVE  Final     Studies/Results: No results found.  Medications:     . aspirin EC  81 mg Oral QHS  . bisoprolol  5 mg Oral Daily  . calcium-vitamin D  1 tablet Oral Daily  . ciprofloxacin  500 mg Oral BID  .  losartan  100 mg Oral Daily  . metroNIDAZOLE  500 mg Oral TID  . pantoprazole  40 mg Oral Q1200  . saccharomyces boulardii  250 mg Oral BID  . simvastatin  40 mg Oral QHS  . vitamin B-12  50 mcg Oral Daily  . DISCONTD: ciprofloxacin  400 mg Intravenous Q12H  . DISCONTD: metronidazole  500 mg Intravenous Q8H     Assessment/Plan Principal Problem:  *COLITIS Active Problems:  CAROTID ARTERY STENOSIS, BILATERAL  PUD, HX OF  Anemia   #1 colitis-clinical improvement patient denies any nausea or vomiting. Abdominal pain has improved. IV Cipro and IV Flagyl to oral regimen. Patient is tolerating current diet. Continue florastor. GI ff. #2 anemia likely secondary to problem #1. Will check an anemia panel will follow H&H per gastroenterology. continue PPI  #3 history of peptic ulcer disease continue Protonix.  #4 carotid artery stenosis bilaterally stable.  #5 prophylaxis- PPI for GI prophylaxis SCDs for DVT prophylaxis         LOS: 4 days   THOMPSON,DANIEL 01/10/2011, 1:49 PM

## 2011-01-11 LAB — STOOL CULTURE

## 2011-01-24 ENCOUNTER — Other Ambulatory Visit: Payer: Self-pay | Admitting: Internal Medicine

## 2011-01-24 ENCOUNTER — Encounter: Payer: Self-pay | Admitting: Internal Medicine

## 2011-01-24 ENCOUNTER — Ambulatory Visit (INDEPENDENT_AMBULATORY_CARE_PROVIDER_SITE_OTHER): Payer: Medicare Other | Admitting: Internal Medicine

## 2011-01-24 ENCOUNTER — Other Ambulatory Visit: Payer: Self-pay | Admitting: General Practice

## 2011-01-24 VITALS — BP 119/60 | HR 82 | Temp 97.6°F | Ht 62.0 in | Wt 100.4 lb

## 2011-01-24 DIAGNOSIS — R634 Abnormal weight loss: Secondary | ICD-10-CM

## 2011-01-24 DIAGNOSIS — R935 Abnormal findings on diagnostic imaging of other abdominal regions, including retroperitoneum: Secondary | ICD-10-CM

## 2011-01-24 DIAGNOSIS — R0989 Other specified symptoms and signs involving the circulatory and respiratory systems: Secondary | ICD-10-CM

## 2011-01-24 DIAGNOSIS — Z85038 Personal history of other malignant neoplasm of large intestine: Secondary | ICD-10-CM

## 2011-01-24 NOTE — Patient Instructions (Addendum)
Continue MiraLax daily as needed.  Continue Prevacid 15 mg daily  CAT scan of the chest with IV contrast to further evaluate the abnormal lungs seen on the recent abdominal CAT scan.  Further recommendations to follow from me in the near future.

## 2011-01-25 ENCOUNTER — Encounter: Payer: Self-pay | Admitting: Internal Medicine

## 2011-01-25 ENCOUNTER — Ambulatory Visit (HOSPITAL_COMMUNITY)
Admission: RE | Admit: 2011-01-25 | Discharge: 2011-01-25 | Disposition: A | Discharge status: Home / Self Care | Payer: Medicare Other | Source: Ambulatory Visit | Attending: Internal Medicine | Admitting: Internal Medicine

## 2011-01-25 DIAGNOSIS — R918 Other nonspecific abnormal finding of lung field: Secondary | ICD-10-CM | POA: Insufficient documentation

## 2011-01-25 DIAGNOSIS — R634 Abnormal weight loss: Secondary | ICD-10-CM | POA: Insufficient documentation

## 2011-01-25 DIAGNOSIS — R933 Abnormal findings on diagnostic imaging of other parts of digestive tract: Secondary | ICD-10-CM | POA: Insufficient documentation

## 2011-01-25 DIAGNOSIS — R0989 Other specified symptoms and signs involving the circulatory and respiratory systems: Secondary | ICD-10-CM

## 2011-01-25 MED ORDER — IOHEXOL 300 MG/ML  SOLN
80.0000 mL | Freq: Once | INTRAMUSCULAR | Status: AC | PRN
  Administered 2011-01-25: 80 mL via INTRAVENOUS

## 2011-01-25 NOTE — Assessment & Plan Note (Addendum)
This nice lady was recently hospitalized after presenting with acute abdominal pain. CT scan again demonstrated thickening of the residual colon suggestive of colitis. It is notable there is no significant in the appearance of her colon when compared  to prior CT. It is notable there was a paucity of diarrhea and no nausea or vomiting with her recent presentation. She was occult blood positive. I got a good look at her colon just over a year ago after she was found to have the abnormal CT findings (colon). Her colonic  mucosa looked entirely normal aside from her well-known surgical anastomosis.  She is completing a course of antibiotics. I suppose she could have had a infectious process. But, again, presentation would be atypical. In addition;  a self-limited bout of colonic ischemia would also remain in the differential at this time.  Her baseline is that of constipation. She continues to take MiraLax to produce reasonably good bowel movements on a regular basis. She has an anemia profile consistent with anemia of chronic disease. She was occult blood positive during her recent hospitalization.  Insidious weight loss, fatigue and dyspnea have been a concern.  Please note that her last 2 CTs demonstrated abnormal lung parenchyma (tree in bud) appearance suggestive of a mycobacterial infection per radiologist. I reviewed her films yesterday with Dr. Tyron Russell. The lung abnormality extend up to the hilum (the extent of the abdominal CT).  I recommended a pulmonary evaluation at the time of her colonoscopy last year. The hospitalist physicians also recently recommended a pulmonary evaluation. Both the patient and her daughter are unaware if such an evaluation has been set up at this time.  Recommendations: After discussion with Dr. Tyron Russell, we'll go and proceed with a dedicated contrast CT of her chest to look at the entire lung fields in making plans to get her to a pulmonologist in the near future. I will be  discussing the case with Dr. Phillips Odor in the near future.  Hemoccult-positive stool is of uncertain significance at this time. I told the patient and her daughter the safest approach would be to look at her colon once again via colonoscopy however, I feel the yield would be low at this time. Patient and daughter wish to pursue the pulmonary evaluation initially which I think is very reasonable.

## 2011-01-25 NOTE — Progress Notes (Signed)
Primary Care Physician:  Caffie Damme, RN, Nursing/RN Coord Primary Gastroenterologist:  Dr. Jena Gauss  Pre-Procedure History & Physical: HPI:  Kayla Lane is a 77 y.o. female here for followup of recent hospitalization. She was admitted with abdominal pain abdominal CT scan again demonstrated thickening of her colon suggestive of colitis. She was treated with antibiotics and her acute abdominal pain eventually subsided. She has not had any associated nausea, vomiting or diarrhea. She was occult blood positive on stool assay recently. Daughter tells me that she has had progressive dyspnea and fatigue. Insidious weight loss has also been a concern.  I performed a colonoscopy on this lady back in August of last year after she presented with abdominal pain and was found to have thickening of her colon wall. Colonoscopy revealed an entirely normal residual colonic mucosa (status post low anterior resection for colon carcinoma back in 1996). At that time, she had long abnormalities concerning for an MAI infection. I recommended further evaluation however this did not happen.  This lady is also at significant problems with spinal stenosis; evaluation and management of this problem has been largely put off lately because of extensive dental work she had to have done.  Past Medical History  Diagnosis Date  . History of colon cancer      1996  . Coronary artery disease   . Myocardial infarction   . GERD (gastroesophageal reflux disease)   . DJD (degenerative joint disease)   . HTN (hypertension)   . Hyperlipidemia   . Spinal stenosis     Past Surgical History  Procedure Date  . Abdominal hysterectomy   . Appendectomy   . Partial colectomy 1996    colon cancer  . Cardiac catheterization   . Back surgery   . Tonsillectomy   . Colonoscopy 01/2009    Dr. Bernita Buffy anastomosis at 18cm. Residual rectal and colonic mucosa appeared normal  . Esophagogastroduodenoscopy 01/2009   Dr. Carron Curie cervical esophageal web s/p dilation, small hh  . Esophagogastroduodenoscopy 11/2009    Dr. Ronni Rumble hh  . Colonoscopy 11/2009    Dr. Marlowe Alt papilla, surgical anastomosis at 18cm    Prior to Admission medications   Medication Sig Start Date End Date Taking? Authorizing Provider  aspirin EC 81 MG tablet Take 81 mg by mouth at bedtime.     Yes Historical Provider, MD  bisoprolol-hydrochlorothiazide (ZIAC) 5-6.25 MG per tablet Take 1 tablet by mouth at bedtime.     Yes Historical Provider, MD  calcium-vitamin D (OSCAL WITH D) 500-200 MG-UNIT per tablet Take 1 tablet by mouth daily.     Yes Historical Provider, MD  HYDROcodone-acetaminophen (NORCO) 5-325 MG per tablet Take 1 tablet by mouth every 6 (six) hours as needed. For pain    Yes Historical Provider, MD  losartan (COZAAR) 100 MG tablet Take 100 mg by mouth daily.     Yes Historical Provider, MD  PREVACID 24HR 15 MG capsule TAKE ONE CAPSULE DAILY. 12/03/10  Yes Gerrit Halls, NP  simvastatin (ZOCOR) 40 MG tablet Take 40 mg by mouth at bedtime.     Yes Historical Provider, MD  vitamin B-12 (CYANOCOBALAMIN) 100 MCG tablet Take 50 mcg by mouth daily.     Yes Historical Provider, MD  saccharomyces boulardii (FLORASTOR) 250 MG capsule Take 1 capsule (250 mg total) by mouth 2 (two) times daily. 01/10/11 02/10/11  Ramiro Harvest    Allergies as of 01/24/2011 - Review Complete 01/24/2011  Allergen Reaction Noted  . Penicillins    .  Sulfonamide derivatives    . Tetracycline      Family History  Problem Relation Age of Onset  . Diabetes type II Other   . Kidney cancer Brother     History   Social History  . Marital Status: Widowed    Spouse Name: N/A    Number of Children: N/A  . Years of Education: N/A   Occupational History  . Not on file.   Social History Main Topics  . Smoking status: Never Smoker   . Smokeless tobacco: Not on file  . Alcohol Use: No  . Drug Use: No  . Sexually Active:    Other  Topics Concern  . Not on file   Social History Narrative  . No narrative on file    Review of Systems: See HPI, otherwise negative ROS  Physical Exam: BP 119/60  Pulse 82  Temp(Src) 97.6 F (36.4 C) (Temporal)  Ht 5\' 2"  (1.575 m)  Wt 100 lb 6.4 oz (45.541 kg)  BMI 18.36 kg/m2 General:   Thin elderly frail lady Alert,  Appears in no acute distress. She is accompanied by her daughter  Head:  Normocephalic and atraumatic. Eyes:  Sclera clear, no icterus.   Conjunctiva pink. Ears:  Normal auditory acuity. Nose:  No deformity, discharge,  or lesions. Mouth:  No deformity or lesions, dentition normal. Neck:  Supple; no masses or thyromegaly. Lungs:  Clear throughout to auscultation.   No wheezes, crackles, or rhonchi. No acute distress. Heart:  Regular rate and rhythm; no murmurs, clicks, rubs,  or gallops. Abdomen: Nondistended positive bowel sounds; no bruits ; she has some mid right-sided abdominal tenderness lateral to her laparotomy scar to deep palpation. I do not appreciate any mass, fascial defect organomegaly Pulses:  Normal pulses noted. Extremities:  Without clubbing or edema. Impression/Plan:

## 2011-01-25 NOTE — Progress Notes (Signed)
Per RMR- he spoke with PA at Dr. Beatrice Lecher office. He is in agreement with chest CT and they will send pt to see pulmonary doctor. Make sure daughter is aware.   Pt needs ov in 6-8 weeks.

## 2011-01-25 NOTE — Progress Notes (Signed)
LM for pts daughter to call me back. Called pt and informed her.

## 2011-01-26 NOTE — Progress Notes (Signed)
Results cc to pcp

## 2011-01-26 NOTE — Progress Notes (Signed)
pts daughter aware 

## 2011-01-30 ENCOUNTER — Emergency Department (HOSPITAL_COMMUNITY): Payer: Medicare Other

## 2011-01-30 ENCOUNTER — Inpatient Hospital Stay (HOSPITAL_COMMUNITY)
Admission: EM | Admit: 2011-01-30 | Discharge: 2011-02-02 | Disposition: A | Discharge status: Other Acute Inpatient Hospital | Payer: Medicare Other | Source: Home / Self Care | Attending: Nurse Practitioner | Admitting: Nurse Practitioner

## 2011-01-30 ENCOUNTER — Other Ambulatory Visit: Payer: Self-pay

## 2011-01-30 ENCOUNTER — Encounter (HOSPITAL_COMMUNITY): Payer: Self-pay | Admitting: Emergency Medicine

## 2011-01-30 DIAGNOSIS — N183 Chronic kidney disease, stage 3 unspecified: Secondary | ICD-10-CM | POA: Diagnosis present

## 2011-01-30 DIAGNOSIS — E785 Hyperlipidemia, unspecified: Secondary | ICD-10-CM | POA: Diagnosis present

## 2011-01-30 DIAGNOSIS — I10 Essential (primary) hypertension: Secondary | ICD-10-CM

## 2011-01-30 DIAGNOSIS — I129 Hypertensive chronic kidney disease with stage 1 through stage 4 chronic kidney disease, or unspecified chronic kidney disease: Secondary | ICD-10-CM | POA: Diagnosis present

## 2011-01-30 DIAGNOSIS — I2510 Atherosclerotic heart disease of native coronary artery without angina pectoris: Secondary | ICD-10-CM | POA: Diagnosis present

## 2011-01-30 DIAGNOSIS — R634 Abnormal weight loss: Secondary | ICD-10-CM | POA: Diagnosis present

## 2011-01-30 DIAGNOSIS — N179 Acute kidney failure, unspecified: Secondary | ICD-10-CM | POA: Diagnosis present

## 2011-01-30 DIAGNOSIS — J189 Pneumonia, unspecified organism: Principal | ICD-10-CM | POA: Diagnosis present

## 2011-01-30 DIAGNOSIS — Z7902 Long term (current) use of antithrombotics/antiplatelets: Secondary | ICD-10-CM

## 2011-01-30 DIAGNOSIS — K521 Toxic gastroenteritis and colitis: Secondary | ICD-10-CM | POA: Diagnosis present

## 2011-01-30 DIAGNOSIS — R531 Weakness: Secondary | ICD-10-CM

## 2011-01-30 DIAGNOSIS — K219 Gastro-esophageal reflux disease without esophagitis: Secondary | ICD-10-CM | POA: Diagnosis present

## 2011-01-30 DIAGNOSIS — I252 Old myocardial infarction: Secondary | ICD-10-CM

## 2011-01-30 DIAGNOSIS — R197 Diarrhea, unspecified: Secondary | ICD-10-CM | POA: Diagnosis present

## 2011-01-30 DIAGNOSIS — I2109 ST elevation (STEMI) myocardial infarction involving other coronary artery of anterior wall: Secondary | ICD-10-CM

## 2011-01-30 DIAGNOSIS — D638 Anemia in other chronic diseases classified elsewhere: Secondary | ICD-10-CM | POA: Diagnosis present

## 2011-01-30 DIAGNOSIS — R627 Adult failure to thrive: Secondary | ICD-10-CM | POA: Diagnosis present

## 2011-01-30 DIAGNOSIS — Z85038 Personal history of other malignant neoplasm of large intestine: Secondary | ICD-10-CM

## 2011-01-30 DIAGNOSIS — I214 Non-ST elevation (NSTEMI) myocardial infarction: Secondary | ICD-10-CM | POA: Diagnosis not present

## 2011-01-30 DIAGNOSIS — Z7982 Long term (current) use of aspirin: Secondary | ICD-10-CM

## 2011-01-30 DIAGNOSIS — I959 Hypotension, unspecified: Secondary | ICD-10-CM | POA: Diagnosis present

## 2011-01-30 DIAGNOSIS — R0600 Dyspnea, unspecified: Secondary | ICD-10-CM | POA: Diagnosis present

## 2011-01-30 DIAGNOSIS — E860 Dehydration: Secondary | ICD-10-CM | POA: Diagnosis present

## 2011-01-30 DIAGNOSIS — R1312 Dysphagia, oropharyngeal phase: Secondary | ICD-10-CM | POA: Diagnosis present

## 2011-01-30 DIAGNOSIS — M1990 Unspecified osteoarthritis, unspecified site: Secondary | ICD-10-CM | POA: Diagnosis present

## 2011-01-30 DIAGNOSIS — R6251 Failure to thrive (child): Secondary | ICD-10-CM

## 2011-01-30 DIAGNOSIS — E039 Hypothyroidism, unspecified: Secondary | ICD-10-CM | POA: Diagnosis not present

## 2011-01-30 HISTORY — DX: Noninfective gastroenteritis and colitis, unspecified: K52.9

## 2011-01-30 LAB — DIFFERENTIAL
Basophils Relative: 0 % (ref 0–1)
Eosinophils Relative: 0 % (ref 0–5)
Lymphocytes Relative: 25 % (ref 12–46)
Monocytes Absolute: 0.9 10*3/uL (ref 0.1–1.0)
Monocytes Relative: 18 % — ABNORMAL HIGH (ref 3–12)

## 2011-01-30 LAB — CBC
HCT: 36.6 % (ref 36.0–46.0)
Hemoglobin: 12.6 g/dL (ref 12.0–15.0)
MCHC: 34.4 g/dL (ref 30.0–36.0)
MCV: 96.8 fL (ref 78.0–100.0)
RDW: 13.5 % (ref 11.5–15.5)
WBC: 5.2 10*3/uL (ref 4.0–10.5)

## 2011-01-30 LAB — COMPREHENSIVE METABOLIC PANEL
ALT: 15 U/L (ref 0–35)
Albumin: 3.5 g/dL (ref 3.5–5.2)
Alkaline Phosphatase: 69 U/L (ref 39–117)
Glucose, Bld: 79 mg/dL (ref 70–99)
Potassium: 4.2 mEq/L (ref 3.5–5.1)
Sodium: 131 mEq/L — ABNORMAL LOW (ref 135–145)
Total Protein: 6.6 g/dL (ref 6.0–8.3)

## 2011-01-30 LAB — URINALYSIS, ROUTINE W REFLEX MICROSCOPIC
Bilirubin Urine: NEGATIVE
Glucose, UA: NEGATIVE mg/dL
Hgb urine dipstick: NEGATIVE
Specific Gravity, Urine: >1.03 — ABNORMAL HIGH (ref 1.005–1.030)
pH: 6 (ref 5.0–8.0)

## 2011-01-30 LAB — CARDIAC PANEL(CRET KIN+CKTOT+MB+TROPI)
CK, MB: 2.4 ng/mL (ref 0.3–4.0)
Troponin I: <0.3 ng/mL (ref <0.30)

## 2011-01-30 MED ORDER — TRAZODONE HCL 50 MG PO TABS
25.0000 mg | ORAL_TABLET | Freq: Every evening | ORAL | Status: DC | PRN
  Administered 2011-01-30: 25 mg via ORAL
  Filled 2011-01-30: qty 1

## 2011-01-30 MED ORDER — FOLIC ACID 1 MG PO TABS
1.0000 mg | ORAL_TABLET | Freq: Every day | ORAL | Status: DC
  Administered 2011-01-31 – 2011-02-02 (×3): 1 mg via ORAL
  Filled 2011-01-30 (×3): qty 1

## 2011-01-30 MED ORDER — SODIUM CHLORIDE 0.9 % IV SOLN
INTRAVENOUS | Status: DC
  Administered 2011-01-30: 19:00:00 via INTRAVENOUS

## 2011-01-30 MED ORDER — ONDANSETRON HCL 4 MG/2ML IJ SOLN: 4.0000 mg | Freq: Four times a day (QID) | INTRAMUSCULAR | Status: DC | PRN

## 2011-01-30 MED ORDER — ACETAMINOPHEN 325 MG PO TABS
650.0000 mg | ORAL_TABLET | Freq: Four times a day (QID) | ORAL | Status: DC | PRN
  Administered 2011-02-02: 650 mg via ORAL
  Filled 2011-01-30: qty 2

## 2011-01-30 MED ORDER — SODIUM CHLORIDE 0.9 % IV BOLUS (SEPSIS): 500.0000 mL | Freq: Once | INTRAVENOUS | Status: DC

## 2011-01-30 MED ORDER — FLEET ENEMA 7-19 GM/118ML RE ENEM: 1.0000 | ENEMA | RECTAL | Status: DC | PRN

## 2011-01-30 MED ORDER — ACETAMINOPHEN 650 MG RE SUPP: 650.0000 mg | Freq: Four times a day (QID) | RECTAL | Status: DC | PRN

## 2011-01-30 MED ORDER — GUAIFENESIN ER 600 MG PO TB12
600.0000 mg | ORAL_TABLET | Freq: Two times a day (BID) | ORAL | Status: DC
  Administered 2011-01-30 – 2011-02-02 (×6): 600 mg via ORAL
  Filled 2011-01-30 (×6): qty 1

## 2011-01-30 MED ORDER — DOCUSATE SODIUM 100 MG PO CAPS: 100.0000 mg | ORAL_CAPSULE | Freq: Two times a day (BID) | ORAL | Status: DC | PRN

## 2011-01-30 MED ORDER — BISACODYL 10 MG RE SUPP: 10.0000 mg | RECTAL | Status: DC | PRN

## 2011-01-30 MED ORDER — ONDANSETRON HCL 4 MG PO TABS: 4.0000 mg | ORAL_TABLET | Freq: Four times a day (QID) | ORAL | Status: DC | PRN

## 2011-01-30 MED ORDER — SODIUM CHLORIDE 0.9 % IV SOLN
INTRAVENOUS | Status: DC
  Administered 2011-01-30: 23:00:00 via INTRAVENOUS
  Filled 2011-01-30 (×2): qty 1000

## 2011-01-30 MED ORDER — SODIUM CHLORIDE 0.9 % IV BOLUS (SEPSIS)
500.0000 mL | Freq: Once | INTRAVENOUS | Status: AC
  Administered 2011-01-30: 500 mL via INTRAVENOUS

## 2011-01-30 NOTE — ED Notes (Signed)
Patient c/o feeling weak, having diarrhea and nausea. Patient diagnosed with infectious pneumonia on Friday. Per daughter patient discharged on January 10, 2011 for colitis.

## 2011-01-30 NOTE — ED Provider Notes (Signed)
Scribed for Kayla Hutching, MD, the patient was seen in room [facility]14/[facility] . This chart was scribed by Ellie Lunch.   CSN: [id number] Arrival date & time: 01/30/2011  4:45 PM   First MD Initiated Contact with Patient 01/30/11 1649      Chief Complaint  Patient presents with  . Diarrhea  . Fatigue  . Nausea    (Consider location/radiation/quality/duration/timing/severity/associated sxs/prior treatment) HPI Kayla Lane is a 77 y.o. female who presents to the Emergency Department complaining of weakness. Per daughter PT has becoming progressively weaker especially in the past 2 days. Describes weakness as severe and constant. Pt was seen at PCP office Friday 2 days ago, received CT scan of lungs and dx with infectious pneumonia. Pt placed on new abx Clarithromycin.  Daughter reports since starting new abx Pt has had nausea and diarrhea which have exacerbated her weakness. Pt is unable to walk around house due to weakness. Pt lives alone. Pt states she is eating some. Pt still have some coughing, wheezing. Denies fever. Daughter says Pt has lost weight recently and only weighs 95 lbs at Dr visit 2 days ago. Daughter is concerned Pt is dehydrated.    Past Medical History  Diagnosis Date  . History of colon cancer      1996  . Coronary artery disease   . Myocardial infarction   . GERD (gastroesophageal reflux disease)   . DJD (degenerative joint disease)   . HTN (hypertension)   . Hyperlipidemia   . Spinal stenosis   . Colitis     Past Surgical History  Procedure Date  . Abdominal hysterectomy   . Appendectomy   . Partial colectomy 1996    colon cancer  . Cardiac catheterization   . Back surgery   . Tonsillectomy   . Colonoscopy 01/2009    Dr. Bernita Buffy anastomosis at 18cm. Residual rectal and colonic mucosa appeared normal  . Esophagogastroduodenoscopy 01/2009    Dr. Carron Curie cervical esophageal web s/p dilation, small hh  . Esophagogastroduodenoscopy 11/2009      Dr. Ronni Rumble hh  . Colonoscopy 11/2009    Dr. Marlowe Alt papilla, surgical anastomosis at 18cm    Family History  Problem Relation Age of Onset  . Diabetes type II Other   . Kidney cancer Brother   . Hypertension Mother   . Diabetes Sister     History  Substance Use Topics  . Smoking status: Never Smoker   . Smokeless tobacco: Never Used  . Alcohol Use: No    OB History    Grav Para Term Preterm Abortions TAB SAB Ect Mult Living   2 2 2       2       Review of Systems 10 Systems reviewed and are negative for acute change except as noted in the HPI.  Allergies  Penicillins; Sulfonamide derivatives; and Tetracycline  Home Medications   Current Outpatient Rx  Name Route Sig Dispense Refill  . ASPIRIN EC 81 MG PO TBEC Oral Take 81 mg by mouth at bedtime.      Marland Kitchen BISOPROLOL-HYDROCHLOROTHIAZIDE 5-6.25 MG PO TABS Oral Take 1 tablet by mouth at bedtime.      Marland Kitchen CALCIUM CARBONATE-VITAMIN D 500-200 MG-UNIT PO TABS Oral Take 1 tablet by mouth daily.      Marland Kitchen HYDROCODONE-ACETAMINOPHEN 5-325 MG PO TABS Oral Take 1 tablet by mouth every 6 (six) hours as needed. For pain     . LOSARTAN POTASSIUM 100 MG PO TABS Oral Take 100 mg by mouth  daily.      Marland Kitchen PREVACID 24HR 15 MG PO CPDR  TAKE ONE CAPSULE DAILY. 31 capsule 3  . SACCHAROMYCES BOULARDII 250 MG PO CAPS Oral Take 1 capsule (250 mg total) by mouth 2 (two) times daily. 62 capsule 0  . SIMVASTATIN 40 MG PO TABS Oral Take 40 mg by mouth at bedtime.      Marland Kitchen VITAMIN B-12 100 MCG PO TABS Oral Take 50 mcg by mouth daily.        BP 86/53  Pulse 88  Temp(Src) 97.6 F (36.4 C) (Oral)  Resp 16  SpO2 100%  Physical Exam  Nursing note and vitals reviewed. Constitutional: She is oriented to person, place, and time.       Appears frail, pale, thin  HENT:  Head: Normocephalic and atraumatic.  Eyes: Conjunctivae and EOM are normal.  Neck: Neck supple.  Cardiovascular: Normal rate, regular rhythm and normal heart sounds.    Pulmonary/Chest: Effort normal and breath sounds normal.  Abdominal: Soft. There is no tenderness.  Musculoskeletal: She exhibits no edema and no tenderness.  Neurological: She is alert and oriented to person, place, and time.  Skin: Skin is warm and dry.  Psychiatric:       Flat affect   ED Course  Procedures (including critical care time)  OTHER DATA REVIEWED: Nursing notes, vital signs, and past medical records reviewed.   DIAGNOSTIC STUDIES: Oxygen Saturation is 100% on room air, nomral by my interpretation.     Date: 01/30/2011  Rate: 81  Rhythm: normal sinus rhythm  QRS Axis: normal  Intervals: normal  ST/T Wave abnormalities: normal  Conduction Disutrbances:none  Narrative Interpretation:   Old EKG Reviewed: none available c SA Labs Reviewed  CBC - Abnormal; Notable for the following:    RBC 3.78 (*)    All other components within normal limits  DIFFERENTIAL - Abnormal; Notable for the following:    Monocytes Relative 18 (*)    All other components within normal limits  COMPREHENSIVE METABOLIC PANEL - Abnormal; Notable for the following:    Sodium 131 (*)    BUN 27 (*)    Creatinine, Ser 1.20 (*)    AST 40 (*)    GFR calc non Af Amer 41 (*)    GFR calc Af Amer 47 (*)    All other components within normal limits  URINALYSIS, ROUTINE W REFLEX MICROSCOPIC - Abnormal; Notable for the following:    Specific Gravity, Urine >1.030 (*)    All other components within normal limits  LIPASE, BLOOD  CARDIAC PANEL(CRET KIN+CKTOT+MB+TROPI)   Dg Chest 2 View  01/30/2011  *RADIOLOGY REPORT*  Clinical Data: Weakness.  Cough.  Gastritis.  Dehydration.  CHEST - 2 VIEW  Comparison: Chest CT 01/25/2011.  Findings: Underlying hyperinflation. Lateral view degraded by patient arm position.  Lower thoracic vertebral augmentation. Midline trachea.  Normal heart size and mediastinal contours. No pleural effusion or pneumothorax.  Vague reticulonodular opacity in the right upper lobe  is similar to on the 01/25/2011 CT.  Chronic.  There is also subtle reticular nodular opacity within the lingula.  No evidence of acute superimposed lobarconsolidation.  IMPRESSION: Probable acute/subacute atypical infection, as detailed on the 01/25/2011 CT.  No evidence of new or progressive pneumonia.  Original Report Authenticated By: Consuello Bossier, M.D.    5:11 PM EDP at PT bedside. Discussed with PT and daughter plan to check blood count, chest x ray, UA. Plan to treat with fluids. Discussed possible admission.  1. Weakness   2. Failure to thrive     MDM  Patient is not eating. Getting weaker. Daughter thinks she is failing. Will admit            Kayla Hutching, MD 02/01/11 [phone]

## 2011-01-31 DIAGNOSIS — R0600 Dyspnea, unspecified: Secondary | ICD-10-CM | POA: Diagnosis present

## 2011-01-31 DIAGNOSIS — N179 Acute kidney failure, unspecified: Secondary | ICD-10-CM | POA: Diagnosis present

## 2011-01-31 DIAGNOSIS — K521 Toxic gastroenteritis and colitis: Secondary | ICD-10-CM | POA: Diagnosis present

## 2011-01-31 DIAGNOSIS — D638 Anemia in other chronic diseases classified elsewhere: Secondary | ICD-10-CM | POA: Diagnosis present

## 2011-01-31 DIAGNOSIS — I959 Hypotension, unspecified: Secondary | ICD-10-CM | POA: Diagnosis present

## 2011-01-31 LAB — CBC
Hemoglobin: 11.3 g/dL — ABNORMAL LOW (ref 12.0–15.0)
MCH: 33.1 pg (ref 26.0–34.0)
RBC: 3.41 MIL/uL — ABNORMAL LOW (ref 3.87–5.11)

## 2011-01-31 LAB — BASIC METABOLIC PANEL
CO2: 22 mEq/L (ref 19–32)
GFR calc non Af Amer: 56 mL/min — ABNORMAL LOW (ref >90)
Glucose, Bld: 90 mg/dL (ref 70–99)
Potassium: 3.7 mEq/L (ref 3.5–5.1)
Sodium: 135 mEq/L (ref 135–145)

## 2011-01-31 LAB — PROTIME-INR: Prothrombin Time: 12.4 seconds (ref 11.6–15.2)

## 2011-01-31 MED ORDER — MOXIFLOXACIN HCL IN NACL 400 MG/250ML IV SOLN
INTRAVENOUS | Status: AC
  Filled 2011-01-31: qty 250

## 2011-01-31 MED ORDER — SODIUM CHLORIDE 0.9 % IV SOLN
INTRAVENOUS | Status: DC
  Administered 2011-01-31: 09:00:00 via INTRAVENOUS

## 2011-01-31 MED ORDER — BENZONATATE 100 MG PO CAPS
100.0000 mg | ORAL_CAPSULE | Freq: Three times a day (TID) | ORAL | Status: DC
  Administered 2011-01-31 – 2011-02-02 (×7): 100 mg via ORAL
  Filled 2011-01-31 (×8): qty 1

## 2011-01-31 MED ORDER — LEVALBUTEROL HCL 0.63 MG/3ML IN NEBU: 0.6300 mg | INHALATION_SOLUTION | Freq: Three times a day (TID) | RESPIRATORY_TRACT | Status: DC

## 2011-01-31 MED ORDER — HYDROCOD POLST-CHLORPHEN POLST 10-8 MG/5ML PO LQCR
5.0000 mL | Freq: Two times a day (BID) | ORAL | Status: DC
  Administered 2011-01-31 – 2011-02-02 (×4): 5 mL via ORAL
  Filled 2011-01-31 (×4): qty 5

## 2011-01-31 MED ORDER — LEVALBUTEROL HCL 0.63 MG/3ML IN NEBU
0.6300 mg | INHALATION_SOLUTION | Freq: Four times a day (QID) | RESPIRATORY_TRACT | Status: DC
  Administered 2011-01-31 – 2011-02-02 (×7): 0.63 mg via RESPIRATORY_TRACT
  Filled 2011-01-31 (×7): qty 3

## 2011-01-31 MED ORDER — POTASSIUM CHLORIDE IN NACL 20-0.9 MEQ/L-% IV SOLN
INTRAVENOUS | Status: DC
  Administered 2011-01-31 – 2011-02-01 (×2): via INTRAVENOUS

## 2011-01-31 MED ORDER — SIMVASTATIN 20 MG PO TABS
40.0000 mg | ORAL_TABLET | Freq: Every day | ORAL | Status: DC
Start: 2011-01-31 — End: 2011-02-02
  Administered 2011-01-31 – 2011-02-01 (×2): 40 mg via ORAL
  Filled 2011-01-31: qty 2
  Filled 2011-01-31: qty 1

## 2011-01-31 MED ORDER — PANTOPRAZOLE SODIUM 40 MG PO TBEC
40.0000 mg | DELAYED_RELEASE_TABLET | Freq: Every day | ORAL | Status: DC
  Administered 2011-01-31 – 2011-02-02 (×3): 40 mg via ORAL
  Filled 2011-01-31 (×3): qty 1

## 2011-01-31 MED ORDER — SACCHAROMYCES BOULARDII 250 MG PO CAPS
250.0000 mg | ORAL_CAPSULE | Freq: Two times a day (BID) | ORAL | Status: DC
  Administered 2011-01-31 – 2011-02-02 (×5): 250 mg via ORAL
  Filled 2011-01-31 (×5): qty 1

## 2011-01-31 MED ORDER — MOXIFLOXACIN HCL IN NACL 400 MG/250ML IV SOLN
400.0000 mg | INTRAVENOUS | Status: DC
  Administered 2011-01-31 – 2011-02-01 (×2): 400 mg via INTRAVENOUS
  Filled 2011-01-31 (×2): qty 250

## 2011-01-31 NOTE — Progress Notes (Signed)
Speech Pathology  Consult received for swallow evaluation and chart reviewed. Given concern for possible silent aspiration, a modified barium swallow study is recommended to rule out aspiration and identify appropriate strategies and diet as needed. Will complete Tuesday late morning.  Thank you, Havery Moros, CCC-SLP [phone]

## 2011-01-31 NOTE — H&P (Signed)
PCP:   Colette Ribas, MD   Chief Complaint:  Diarrhea and generalized weakness for 2 days  HPI: Kayla Lane is an 77 y.o. female.   Multiple medical problems noted below; persistent abnormal seen on chest imaging for over a year. Recently discharge after ttreatment with Cipro/flagyl for Colitis. Recently started on Biaxin by PCP for presumed atypical pneumonia. Due to the severe diarrhea and progressive weakness and came to emergency room.  Patient hasin fact been having progressive cough and progressive weakness for at least weaks. Lives alone and has been made to come to hospital by family.  Denies cough although coughing persistently throughoutt the interview.  No fever or CP; no sweats. 25 lbs wt loss over 1 year Now so weak she has difficulty walking.   Past Medical History  Diagnosis Date  . History of colon cancer      1996  . Coronary artery disease   . Myocardial infarction   . GERD (gastroesophageal reflux disease)   . DJD (degenerative joint disease)   . HTN (hypertension)   . Hyperlipidemia   . Spinal stenosis   . Colitis     Past Surgical History  Procedure Date  . Abdominal hysterectomy   . Appendectomy   . Partial colectomy 1996    colon cancer  . Cardiac catheterization   . Back surgery   . Tonsillectomy   . Colonoscopy 01/2009    Dr. Bernita Buffy anastomosis at 18cm. Residual rectal and colonic mucosa appeared normal  . Esophagogastroduodenoscopy 01/2009    Dr. Carron Curie cervical esophageal web s/p dilation, small hh  . Esophagogastroduodenoscopy 11/2009    Dr. Ronni Rumble hh  . Colonoscopy 11/2009    Dr. Marlowe Alt papilla, surgical anastomosis at 18cm    Medications:  HOME MEDS: Prior to Admission medications   Medication Sig Start Date End Date Taking? Authorizing Provider  aspirin EC 81 MG tablet Take 81 mg by mouth at bedtime.     Yes Historical Provider, MD  benzonatate (TESSALON) 100 MG capsule Take 100 mg by mouth  3 (three) times daily.     Yes Historical Provider, MD  bisoprolol-hydrochlorothiazide (ZIAC) 5-6.25 MG per tablet Take 1 tablet by mouth at bedtime.     Yes Historical Provider, MD  calcium-vitamin D (OSCAL WITH D) 500-200 MG-UNIT per tablet Take 1 tablet by mouth daily.     Yes Historical Provider, MD  clarithromycin (BIAXIN) 500 MG tablet Take 500 mg by mouth 2 (two) times daily.     Yes Historical Provider, MD  HYDROcodone-acetaminophen (NORCO) 5-325 MG per tablet Take 1 tablet by mouth every 6 (six) hours as needed. For pain    Yes Historical Provider, MD  losartan (COZAAR) 100 MG tablet Take 100 mg by mouth daily.     Yes Historical Provider, MD  PREVACID 24HR 15 MG capsule TAKE ONE CAPSULE DAILY. 12/03/10  Yes Gerrit Halls, NP  saccharomyces boulardii (FLORASTOR) 250 MG capsule Take 1 capsule (250 mg total) by mouth 2 (two) times daily. 01/10/11 02/10/11 Yes Ramiro Harvest  simvastatin (ZOCOR) 40 MG tablet Take 40 mg by mouth at bedtime.     Yes Historical Provider, MD  vitamin B-12 (CYANOCOBALAMIN) 100 MCG tablet Take 50 mcg by mouth daily.     Yes Historical Provider, MD    PRIOR TO AMDISSION MEDS   Allergies:  Allergies  Allergen Reactions  . Penicillins     REACTION: Unknown reaction  . Sulfonamide Derivatives     REACTION: Unknown reaction  .  Tetracycline     REACTION: Unknown reaction    Social History:   reports that she has never smoked. She has never used smokeless tobacco. She reports that she does not drink alcohol or use illicit drugs.  Family History: Family History  Problem Relation Age of Onset  . Diabetes type II Other   . Kidney cancer Brother   . Hypertension Mother   . Diabetes Sister     Rewiew of Systems:  The patient denies anorexia, fever, , vision loss, decreased hearing, hoarseness, chest pain, syncope, dyspnea on exertion, peripheral edema, balance deficits, hemoptysis, abdominal pain, melena, hematochezia, severe indigestion/heartburn, hematuria,  incontinence, genital sores, muscle weakness, suspicious skin lesions, transient blindness, difficulty walking, depression, unusual weight change, abnormal bleeding, enlarged lymph nodes, angioedema, and breast masses.   Physical Exam: Filed Vitals:   01/30/11 2303 01/30/11 2327 01/30/11 2328 01/30/11 2329  BP: 148/81 145/80 143/83 117/81  Pulse: 86     Temp: 97.9 F (36.6 C)     TempSrc: Oral     Resp: 20     Height: 5\' 1"  (1.549 m)     Weight: 44 kg (97 lb)     SpO2: 99%      Blood pressure 117/81, pulse 86, temperature 97.9 F (36.6 C), temperature source Oral, resp. rate 20, height 5\' 1"  (1.549 m), weight 44 kg (97 lb), SpO2 99.00%.  GEN: Reveals elderly  lying in the stretcher in no acute distress; cooperative with exam PSYCH: He is alert and oriented x4; somewhat aprehensive HEENT: Mucous membranes pink dry and anicteric; PERRLA; EOM intact; no cervical lymphadenopathy nor thyromegaly or carotid bruit; no JVD; Breasts:: Not examined CHEST WALL: No tenderness CHEST: Normal respiration, clear to auscultation bilaterally HEART: Regular rate and rhythm; no murmurs rubs or gallops BACK: ; no CVA tenderness ABDOMEN: scaphoid, soft non-tender; no masses, no organomegaly, normal abdominal bowel sounds; no pannus; no intertriginous candida. Rectal Exam: Not done EXTREMITIES: ; age-appropriate arthropathy of the hands and knees; no edema; no ulcerations. Old briuses of right shin Genitalia: not examined PULSES: 2+ and symmetric SKIN: Normal hydration no rash or ulceration CNS: Cranial nerves 2-12 grossly intact no focal neurologic deficit   Labs & Imaging Results for orders placed during the hospital encounter of 01/30/11 (from the past 48 hour(s))  CBC     Status: Abnormal   Collection Time   01/30/11  5:32 PM      Component Value Range Comment   WBC 5.2  4.0 - 10.5 (K/uL)    RBC 3.78 (*) 3.87 - 5.11 (MIL/uL)    Hemoglobin 12.6  12.0 - 15.0 (g/dL)    HCT 91.4  78.2 - 95.6 (%)     MCV 96.8  78.0 - 100.0 (fL)    MCH 33.3  26.0 - 34.0 (pg)    MCHC 34.4  30.0 - 36.0 (g/dL)    RDW 21.3  08.6 - 57.8 (%)    Platelets 172  150 - 400 (K/uL)   DIFFERENTIAL     Status: Abnormal   Collection Time   01/30/11  5:32 PM      Component Value Range Comment   Neutrophils Relative 56  43 - 77 (%)    Neutro Abs 2.9  1.7 - 7.7 (K/uL)    Lymphocytes Relative 25  12 - 46 (%)    Lymphs Abs 1.3  0.7 - 4.0 (K/uL)    Monocytes Relative 18 (*) 3 - 12 (%)    Monocytes Absolute 0.9  0.1 - 1.0 (K/uL)    Eosinophils Relative 0  0 - 5 (%)    Eosinophils Absolute 0.0  0.0 - 0.7 (K/uL)    Basophils Relative 0  0 - 1 (%)    Basophils Absolute 0.0  0.0 - 0.1 (K/uL)   COMPREHENSIVE METABOLIC PANEL     Status: Abnormal   Collection Time   01/30/11  5:32 PM      Component Value Range Comment   Sodium 131 (*) 135 - 145 (mEq/L)    Potassium 4.2  3.5 - 5.1 (mEq/L)    Chloride 97  96 - 112 (mEq/L)    CO2 26  19 - 32 (mEq/L)    Glucose, Bld 79  70 - 99 (mg/dL)    BUN 27 (*) 6 - 23 (mg/dL)    Creatinine, Ser 1.61 (*) 0.50 - 1.10 (mg/dL)    Calcium 9.2  8.4 - 10.5 (mg/dL)    Total Protein 6.6  6.0 - 8.3 (g/dL)    Albumin 3.5  3.5 - 5.2 (g/dL)    AST 40 (*) 0 - 37 (U/L)    ALT 15  0 - 35 (U/L)    Alkaline Phosphatase 69  39 - 117 (U/L)    Total Bilirubin 0.5  0.3 - 1.2 (mg/dL)    GFR calc non Af Amer 41 (*) >90 (mL/min)    GFR calc Af Amer 47 (*) >90 (mL/min)   LIPASE, BLOOD     Status: Normal   Collection Time   01/30/11  5:32 PM      Component Value Range Comment   Lipase 47  11 - 59 (U/L)   APTT     Status: Normal   Collection Time   01/30/11  5:32 PM      Component Value Range Comment   aPTT 26  24 - 37 (seconds)   PROTIME-INR     Status: Normal   Collection Time   01/30/11  5:32 PM      Component Value Range Comment   Prothrombin Time 12.4  11.6 - 15.2 (seconds)    INR 0.91  0.00 - 1.49    BILIRUBIN, DIRECT     Status: Normal   Collection Time   01/30/11  5:32 PM       Component Value Range Comment   Bilirubin, Direct 0.1  0.0 - 0.3 (mg/dL)   CARDIAC PANEL(CRET KIN+CKTOT+MB+TROPI)     Status: Normal   Collection Time   01/30/11  5:34 PM      Component Value Range Comment   Total CK 61  7 - 177 (U/L)    CK, MB 2.4  0.3 - 4.0 (ng/mL)    Troponin I <0.30  <0.30 (ng/mL)    Relative Index RELATIVE INDEX IS INVALID  0.0 - 2.5    URINALYSIS, ROUTINE W REFLEX MICROSCOPIC     Status: Abnormal   Collection Time   01/30/11  6:56 PM      Component Value Range Comment   Color, Urine YELLOW  YELLOW     Appearance CLEAR  CLEAR     Specific Gravity, Urine >1.030 (*) 1.005 - 1.030     pH 6.0  5.0 - 8.0     Glucose, UA NEGATIVE  NEGATIVE (mg/dL)    Hgb urine dipstick NEGATIVE  NEGATIVE     Bilirubin Urine NEGATIVE  NEGATIVE     Ketones, ur NEGATIVE  NEGATIVE (mg/dL)    Protein, ur NEGATIVE  NEGATIVE (mg/dL)  Urobilinogen, UA 0.2  0.0 - 1.0 (mg/dL)    Nitrite NEGATIVE  NEGATIVE     Leukocytes, UA NEGATIVE  NEGATIVE  MICROSCOPIC NOT DONE ON URINES WITH NEGATIVE PROTEIN, BLOOD, LEUKOCYTES, NITRITE, OR GLUCOSE <1000 mg/dL.   Dg Chest 2 View  01/30/2011  *RADIOLOGY REPORT*  Clinical Data: Weakness.  Cough.  Gastritis.  Dehydration.  CHEST - 2 VIEW  Comparison: Chest CT 01/25/2011.  Findings: Underlying hyperinflation. Lateral view degraded by patient arm position.  Lower thoracic vertebral augmentation. Midline trachea.  Normal heart size and mediastinal contours. No pleural effusion or pneumothorax.  Vague reticulonodular opacity in the right upper lobe is similar to on the 01/25/2011 CT.  Chronic.  There is also subtle reticular nodular opacity within the lingula.  No evidence of acute superimposed lobarconsolidation.  IMPRESSION: Probable acute/subacute atypical infection, as detailed on the 01/25/2011 CT.  No evidence of new or progressive pneumonia.  Original Report Authenticated By: Consuello Bossier, M.D.      Assessment Present on Admission:  .WEIGHT  LOSS .Atypical pneumonia .Weakness generalized .Dehydration .HTN (hypertension), benign .Hyperlipidemia   PLAN: Will admit for hydration; hold antibiotics; check for C. difficile if she continues with diarrhea; Pulmonary consult for possible bronchoscopy Other plans as per orders.   Rui Wordell 01/31/2011, 1:29 AM

## 2011-01-31 NOTE — Consult Note (Signed)
Consult requested [id number] attending Consult requested for abnormal chest x-ray:  JXB:JYNW is an 85-yeald Caucasian female with multiple medical problems including an abnormal chest x-ray. She has been having cough and congestion for several months and reports that she's been having trouble for at least one year.she had CT of the chest which was suggested that she might have a mycobacterial infection  Past Medical History  Diagnosis Date  . History of colon cancer      1996  . Coronary artery disease   . Myocardial infarction   . GERD (gastroesophageal reflux disease)   . DJD (degenerative joint disease)   . HTN (hypertension)   . Hyperlipidemia   . Spinal stenosis   . Colitis      Family History  Problem Relation Age of Onset  . Diabetes type II Other   . Kidney cancer Brother   . Hypertension Mother   . Diabetes Sister      History   Social History  . Marital Status: Widowed    Spouse Name: N/A    Number of Children: N/A  . Years of Education: N/A   Social History Main Topics  . Smoking status: Never Smoker   . Smokeless tobacco: Never Used  . Alcohol Use: No  . Drug Use: No  . Sexually Active: No   Other Topics Concern  . None   Social History Narrative  . None     ROS: except as mentioned is negative    Objective: Vital signs in last 24 hours: Temp:  [97.6 F (36.4 C)-98.1 F (36.7 C)] 97.9 F (36.6 C) (10/22 1800) Pulse Rate:  [74-86] 76  (10/22 1800) Resp:  [15-24] 18  (10/22 1800) BP: (83-148)/(54-83) 114/71 mmHg (10/22 1800) SpO2:  [94 %-100 %] 98 % (10/22 1800) Weight:  [44 kg (97 lb)-44.4 kg (97 lb 14.2 oz)] 97 lb 14.2 oz (44.4 kg) (10/22 0930) Weight change:  Last BM Date: 01/30/11  Intake/Output from previous day: 10/21 0701 - 10/22 0700 In: 700 [I.V.:700] Out: 1 [Urine:1]  PHYSICAL EXAM she is very . Her HEENT examination is unremarkable. Her neck is supple without masses. Her chest shows some rhonchi bilaterally and  generally decreased breath sounds her heart is regular without gallop a period her abdomen is soft and her bowel sounds are and active. Her extremities showed no clubbing cyanosis or edema. Her central nervous mination is grossly  Lab Results: Basic Metabolic Panel:  Basename 01/31/11 0412 01/30/11 1732  NA 135 131*  K 3.7 4.2  CL 105 97  CO2 22 26  GLUCOSE 90 79  BUN 20 27*  CREATININE 0.92 1.20*  CALCIUM 8.5 9.2  MG -- --  PHOS -- --   Liver Function Tests:  [facility] 01/30/11 1732  AST 40*  ALT 15  ALKPHOS 69  BILITOT 0.5  PROT 6.6  ALBUMIN 3.5    Basename 01/30/11 1732  LIPASE 47  AMYLASE --   No results found for this basename: AMMONIA:2 in the last 72 hours CBC:  Basename 01/31/11 0412 01/30/11 1732  WBC 4.6 5.2  NEUTROABS -- 2.9  HGB 11.3* 12.6  HCT 32.9* 36.6  MCV 96.5 96.8  PLT 153 172   Cardiac Enzymes:  Basename 01/30/11 1734  CKTOTAL 61  CKMB 2.4  CKMBINDEX --  TROPONINI <0.30   BNP: No results found for this basename: POCBNP:3 in the last 72 hours D-Dimer: No results found for this basename: DDIMER:2 in the last 72 hours CBG: No results  found for this basename: GLUCAP:6 in the last 72 hours Hemoglobin A1C: No results found for this basename: HGBA1C in the last 72 hours Fasting Lipid Panel: No results found for this basename: CHOL,HDL,LDLCALC,TRIG,CHOLHDL,LDLDIRECT in the last 72 hours Thyroid Function Tests:  Basename 01/30/11 1732  TSH 5.756*  T4TOTAL --  FREET4 --  T3FREE --  THYROIDAB --   Anemia Panel: No results found for this basename: VITAMINB12,FOLATE,FERRITIN,TIBC,IRON,RETICCTPCT in the last 72 hours Urine Drug Screen:  Alcohol Level: No results found for this basename: ETH:2 in the last 72 hours Urinalysis:  Misc. Labs:   ABGS: No results found for this basename: PHART,PCO2,PO2ART,TCO2,HCO3 in the last 72 hours   MICROBIOLOGY: No results found for this or any previous visit (from the past 240  hour(s)).  Studies/Results: Dg Chest 2 View  01/30/2011  *RADIOLOGY REPORT*  Clinical Data: Weakness.  Cough.  Gastritis.  Dehydration.  CHEST - 2 VIEW  Comparison: Chest CT 01/25/2011.  Findings: Underlying hyperinflation. Lateral view degraded by patient arm position.  Lower thoracic vertebral augmentation. Midline trachea.  Normal heart size and mediastinal contours. No pleural effusion or pneumothorax.  Vague reticulonodular opacity in the right upper lobe is similar to on the 01/25/2011 CT.  Chronic.  There is also subtle reticular nodular opacity within the lingula.  No evidence of acute superimposed lobarconsolidation.  IMPRESSION: Probable acute/subacute atypical infection, as detailed on the 01/25/2011 CT.  No evidence of new or progressive pneumonia.  Original Report Authenticated By: Consuello Bossier, M.D.    Medications:  Prior to Admission:  Prescriptions prior to admission  Medication Sig Dispense Refill  . aspirin EC 81 MG tablet Take 81 mg by mouth at bedtime.        . benzonatate (TESSALON) 100 MG capsule Take 100 mg by mouth 3 (three) times daily.        . bisoprolol-hydrochlorothiazide (ZIAC) 5-6.25 MG per tablet Take 1 tablet by mouth at bedtime.        . calcium-vitamin D (OSCAL WITH D) 500-200 MG-UNIT per tablet Take 1 tablet by mouth daily.        . clarithromycin (BIAXIN) 500 MG tablet Take 500 mg by mouth 2 (two) times daily.        Marland Kitchen HYDROcodone-acetaminophen (NORCO) 5-325 MG per tablet Take 1 tablet by mouth every 6 (six) hours as needed. For pain       . losartan (COZAAR) 100 MG tablet Take 100 mg by mouth daily.        Marland Kitchen PREVACID 24HR 15 MG capsule TAKE ONE CAPSULE DAILY.  31 capsule  3  . saccharomyces boulardii (FLORASTOR) 250 MG capsule Take 1 capsule (250 mg total) by mouth 2 (two) times daily.  62 capsule  0  . simvastatin (ZOCOR) 40 MG tablet Take 40 mg by mouth at bedtime.        . vitamin B-12 (CYANOCOBALAMIN) 100 MCG tablet Take 50 mcg by mouth daily.          Scheduled:   . benzonatate  100 mg Oral TID  . chlorpheniramine-HYDROcodone  5 mL Oral Q12H  . folic acid  1 mg Oral Daily  . guaiFENesin  600 mg Oral BID  . levalbuterol  0.63 mg Nebulization Q6H  . moxifloxacin  400 mg Intravenous Q24H  . pantoprazole  40 mg Oral Daily  . saccharomyces boulardii  250 mg Oral BID  . simvastatin  40 mg Oral QHS  . DISCONTD: levalbuterol  0.63 mg Nebulization Q8H  .  DISCONTD: sodium chloride  500 mL Intravenous Once   Continuous:   . 0.9 % NaCl with KCl 20 mEq / L 40 mL/hr at 01/31/11 1543  . DISCONTD: sodium chloride 125 mL/hr at 01/30/11 1845  . DISCONTD: sodium chloride 100 mL/hr at 01/31/11 0900  . DISCONTD: sodium chloride 0.9 % 1,000 mL infusion 100 mL/hr at 01/30/11 2300   [id number], acetaminophen, bisacodyl, docusate sodium, ondansetron (ZOFRAN) IV, ondansetron, sodium phosphate, traZODone  Assesment:she has abnormal chest CT which could be do to a mycobacterial infection but I agree tat this could be do to aspiration. She has lost weight. Principal Problem:  *Dehydration Active Problems:  ATHEROSCLEROTIC CARDIOVASCULAR DISEASE  WEIGHT LOSS  Atypical pneumonia  Weakness generalized  HTN (hypertension), benign  Hyperlipidemia  Anemia, chronic disease  Acute renal failure (ARF)  Hypotension  Diarrhea due to drug  Chronic kidney disease (CKD), stage III (moderate)  Dyspnea    Plan:I put her on Avelox. I would like to see how her swallowing study comes out. Further evaluation depending on the results of the swallowing study and how she do es on Avelox    LOS: 1 day   Tong Pieczynski L 01/31/2011, 8:24 PM

## 2011-01-31 NOTE — Progress Notes (Signed)
Subjective: Alert and ambulatory in room, denies further diarrhea; Has had wet sounding cough for several weeks, not on oxygen at home, denies choking or coughing with eating or sensation of food sticking with eating. Daughter in room and assisting with history.  Objective: Vital signs in last 24 hours: Temp:  [97.6 F (36.4 C)-98.4 F (36.9 C)] 97.6 F (36.4 C) (10/22 1000) Pulse Rate:  [76-88] 76  (10/22 1000) Resp:  [15-24] 18  (10/22 1000) BP: (83-152)/(45-83) 124/74 mmHg (10/22 1000) SpO2:  [94 %-100 %] 97 % (10/22 1000) Weight:  [44 kg (97 lb)-44.4 kg (97 lb 14.2 oz)] 97 lb 14.2 oz (44.4 kg) (10/22 0930) Weight change:  Last BM Date: 01/30/11  Intake/Output from previous day: 10/21 0701 - 10/22 0700 In: -  Out: 1 [Urine:1] Intake/Output this shift: Total I/O In: 0  Out: 1 [Urine:1]  General appearance: alert, cooperative, appears stated age and no distress Resp: clear to auscultation bilaterally, nonlabored, RA;  (Later examined, per Dr. Sherrie Mustache: Pt has few crackles and wheezes). Cardio: regular rate and rhythm, S1, S2 normal, no murmur, click, rub or gallop. IV fluids at 100/hr GI: soft, non-tender; bowel sounds normal; no masses,  no organomegaly Extremities: extremities normal, atraumatic, no cyanosis or edema Pulses: 2+ and symmetric Neurologic: Alert and oriented X 3, normal strength and tone. Normal symmetric reflexes. Normal coordination and gait  Lab Results:  [facility] 01/31/11 0412 01/30/11 1732  WBC 4.6 5.2  HGB 11.3* 12.6  HCT 32.9* 36.6  PLT 153 172   BMET  Basename 01/31/11 0412 01/30/11 1732  NA 135 131*  K 3.7 4.2  CL 105 97  CO2 22 26  GLUCOSE 90 79  BUN 20 27*  CREATININE 0.92 1.20*  CALCIUM 8.5 9.2    Studies/Results: Dg Chest 2 View  01/30/2011  *RADIOLOGY REPORT*  Clinical Data: Weakness.  Cough.  Gastritis.  Dehydration.  CHEST - 2 VIEW  Comparison: Chest CT 01/25/2011.  Findings: Underlying hyperinflation. Lateral view degraded by  patient arm position.  Lower thoracic vertebral augmentation. Midline trachea.  Normal heart size and mediastinal contours. No pleural effusion or pneumothorax.  Vague reticulonodular opacity in the right upper lobe is similar to on the 01/25/2011 CT.  Chronic.  There is also subtle reticular nodular opacity within the lingula.  No evidence of acute superimposed lobarconsolidation.  IMPRESSION: Probable acute/subacute atypical infection, as detailed on the 01/25/2011 CT.  No evidence of new or progressive pneumonia.  Original Report Authenticated By: Consuello Bossier, M.D.    Medications: I have reviewed the patient's current medications.  Assessment/Plan: Patient Active Hospital Problem List: Dehydration (01/30/2011)    POA: Yes  Plan: Resolving- continue IVF for now- likley 2/2 diarrhea preadmit in association with use of medicine (HCTZ)  Diarrhea due to drug: (01/31/2011): Resolved- ? Has c. difficile colitis- PCR pending  WEIGHT LOSS (12/25/2009)    POA: Yes Plan: Consider nutrition consult this admission  Atypical pneumonia (01/30/2011)    POA: Yes  Plan: Currently not hypoxic and lungs clear- only has wet cough; initially seen on CT chest July 2011; pulmonary medicine consult pending- PCP placed pt on antibiotics preadmit- endorses dyspnea- consider dysphagia/silent aspiration as etiology- SLP evaluation pending. (See Dr. Theodis Aguas exam above. The patient may have acute bronchitis).  Atherosclerotic heart disease: (01/31/2011) No symptoms but ongoing dyspnea could be ischemic equivalent- may need to obtain records from cardiology; consider cards consult this admission- pt is due follow up visit with [facility] this week  Dyspnea: (  10/22/20120: See above- may need 2D ECHO if not done in past 1-2 years  Weakness generalized (01/30/2011)    POA: Yes  Plan:  PT/OT evaluation  HTN (hypertension), benign (01/30/2011)    POA: Yes   Plan: Presented with hypotension so home anti-HTN meds on  hold  Hyperlipidemia (01/30/2011)    POA: Yes   Plan: Stable  Anemia, chronic disease (01/31/2011)    POA: Yes   Plan: Continue follow CBC- no signs active bleeding- consider check anemia panel if has not yet been done this admission or in past 3 months; TSH pending- consider check FOB x 3- has h/o colitis and prior colon resection  Acute renal failure (ARF) (01/31/2011)    POA: Yes   Plan: Resolved after adequately hydrated and offending meds  (ARB/diuretics, etc) held and BP restored to normotensive state  Chronic kidney disease stage 3: Crcl on current cr of.95 = 32 with IBW 132 lbs- baseline creatinine unknown  Hypotension (01/31/2011)    POA: Yes   Plan: Resolved after hydration and BP meds held- check orthostatic VS x 1  Disposition: Remain IP. Follow up on AM labs and pulmonologist recommendations    Attending:  I have seen and examined the patient. I agree with Ms. Rennis Harding' excellent assessment and plan. However, the Pt  now has a few wheezes and crackles on the lung exam. Will decrease IVF to 40 cc/hr. Add scheduled Tussionex bid. Add q 6 hr Xopenex. Check a Pro BNP in the a.m.   LOS: 1 day   ELLIS,ALLISON L. 01/31/2011, 1:49 PM

## 2011-02-01 ENCOUNTER — Inpatient Hospital Stay (HOSPITAL_COMMUNITY): Payer: Medicare Other

## 2011-02-01 DIAGNOSIS — R1312 Dysphagia, oropharyngeal phase: Secondary | ICD-10-CM | POA: Diagnosis present

## 2011-02-01 DIAGNOSIS — E039 Hypothyroidism, unspecified: Secondary | ICD-10-CM | POA: Diagnosis not present

## 2011-02-01 DIAGNOSIS — I517 Cardiomegaly: Secondary | ICD-10-CM

## 2011-02-01 LAB — RENAL FUNCTION PANEL
Calcium: 8.1 mg/dL — ABNORMAL LOW (ref 8.4–10.5)
GFR calc Af Amer: 77 mL/min — ABNORMAL LOW (ref >90)
GFR calc non Af Amer: 66 mL/min — ABNORMAL LOW (ref >90)
Phosphorus: 2.2 mg/dL — ABNORMAL LOW (ref 2.3–4.6)
Sodium: 136 mEq/L (ref 135–145)

## 2011-02-01 LAB — CBC
MCH: 33.5 pg (ref 26.0–34.0)
MCHC: 34.7 g/dL (ref 30.0–36.0)
MCV: 96.8 fL (ref 78.0–100.0)
Platelets: 136 10*3/uL — ABNORMAL LOW (ref 150–400)
RDW: 13.3 % (ref 11.5–15.5)
WBC: 4.9 10*3/uL (ref 4.0–10.5)

## 2011-02-01 LAB — DIFFERENTIAL
Basophils Absolute: 0 10*3/uL (ref 0.0–0.1)
Basophils Relative: 0 % (ref 0–1)
Eosinophils Absolute: 0 10*3/uL (ref 0.0–0.7)
Eosinophils Relative: 1 % (ref 0–5)

## 2011-02-01 MED ORDER — BOOST / RESOURCE BREEZE PO LIQD
1.0000 | Freq: Three times a day (TID) | ORAL | Status: DC
  Administered 2011-02-01: 1 via ORAL
  Filled 2011-02-01 (×6): qty 1

## 2011-02-01 NOTE — Progress Notes (Signed)
*  PRELIMINARY RESULTS* Echocardiogram 2D Echocardiogram has been performed.  Conrad [place] 02/01/2011, 1:31 PM

## 2011-02-01 NOTE — Progress Notes (Signed)
INITIAL ADULT NUTRITION ASSESSMENT Date: 02/01/2011   Time: 3:29 PM Reason for Assessment: Consult poor po, unplanned wt loss  ASSESSMENT: Female 77 y.o.  Dx: Dehydration   Past Medical History  Diagnosis Date  . History of colon cancer      1996  . Coronary artery disease   . Myocardial infarction   . GERD (gastroesophageal reflux disease)   . DJD (degenerative joint disease)   . HTN (hypertension)   . Hyperlipidemia   . Spinal stenosis   . Colitis      Scheduled Meds:   . benzonatate  100 mg Oral TID  . chlorpheniramine-HYDROcodone  5 mL Oral Q12H  . feeding supplement  1 Container Oral TID WC  . folic acid  1 mg Oral Daily  . guaiFENesin  600 mg Oral BID  . levalbuterol  0.63 mg Nebulization Q6H  . moxifloxacin  400 mg Intravenous Q24H  . pantoprazole  40 mg Oral Daily  . saccharomyces boulardii  250 mg Oral BID  . simvastatin  40 mg Oral QHS  . DISCONTD: levalbuterol  0.63 mg Nebulization Q8H   Continuous Infusions:   . 0.9 % NaCl with KCl 20 mEq / L 40 mL/hr at 01/31/11 1543  . DISCONTD: sodium chloride 100 mL/hr at 01/31/11 0900   PRN Meds:.acetaminophen, acetaminophen, bisacodyl, docusate sodium, ondansetron (ZOFRAN) IV, ondansetron, sodium phosphate, traZODone  Ht: 5\' 1"  (154.9 cm)  Wt: 94 lb 12.8 oz (43 kg)  Ideal Wt: 47.8 kg 52.3 kg (IBW-105#) % Ideal Wt: 90%  Usual Wt: 135-140# x90d ago % Usual Wt: 70%  Body mass index is 17.91 kg/(m^2).  Food/Nutrition Related Hx: Pt up in chair. Very pleasant lady who has had severe unplanned wt loss of 40# since this past summer. She c/o decreased taste perception, poor appetite and poor fluid intake. She has been forcing herself to eat yogurt and drink Ensure. Recently she has been drinking Ensure Clear with her meals. We have comparable product which will be added with her meals. She reports to have eaten well at lunch today ~50%. Other meals however 10-20%. She denies difficulty chewing and MBSS has been  completed.SLP rec cont with current diet.This is her second hospitalization in past 30d. Pt has severe malnutrition given her severe unplanned wt loss and inadequate oral intake.    CMP     Component Value Date/Time   NA 136 01/31/2011 2348   K 3.7 01/31/2011 2348   CL 107 01/31/2011 2348   CO2 22 01/31/2011 2348   GLUCOSE 101* 01/31/2011 2348   BUN 14 01/31/2011 2348   CREATININE 0.80 01/31/2011 2348   CALCIUM 8.1* 01/31/2011 2348   PROT 6.6 01/30/2011 1732   ALBUMIN 2.5* 01/31/2011 2348   AST 40* 01/30/2011 1732   ALT 15 01/30/2011 1732   ALKPHOS 69 01/30/2011 1732   BILITOT 0.5 01/30/2011 1732   GFRNONAA 66* 01/31/2011 2348   GFRAA 77* 01/31/2011 2348   CBC    Component Value Date/Time   WBC 4.9 01/31/2011 2348   RBC 3.10* 01/31/2011 2348   HGB 10.4* 01/31/2011 2348   HCT 30.0* 01/31/2011 2348   PLT 136* 01/31/2011 2348   MCV 96.8 01/31/2011 2348   MCH 33.5 01/31/2011 2348   MCHC 34.7 01/31/2011 2348   RDW 13.3 01/31/2011 2348   LYMPHSABS 1.4 01/31/2011 2348   MONOABS 0.6 01/31/2011 2348   EOSABS 0.0 01/31/2011 2348   BASOSABS 0.0 01/31/2011 2348   I/O last 3 completed shifts: In: 2481.7 [  P.O.:240; I.V.:1991.7; IV Piggyback:250] Out: 4 [Urine:4]   Diet Order: Cardiac- Heart Healthy (10-50%)  Supplements/Tube Feeding: Resource Breeze added TID with meals  IVF:    0.9 % NaCl with KCl 20 mEq / L Last Rate: 40 mL/hr at 01/31/11 1543  DISCONTD: sodium chloride Last Rate: 100 mL/hr at 01/31/11 0900    Estimated Nutritional Needs:   Kcal:1290-1505 Protein:65-73 grams  Fluid:1.3-1.5 L/d  NUTRITION DIAGNOSIS: -Inadequate oral intake (NI-2.1).  Status: Ongoing  RELATED TO:  -altered taste perception, dry mouth and decr appetite  AS EVIDENCE BY:  -Unplanned wt loss of 40#, 29%, meal intake 10-50%  MONITORING/EVALUATION(Goals): -Pt will improve po intake to consistently meet >75% of est energy and protein needs.  EDUCATION NEEDS: -Education needs  addressed. Discussed ways for pt to incr caloric intake. Will also provide handout.  INTERVENTION: -Add Resource Breeze 237 ml TID with meals -Provide edu materials High Calorie, High Protein diet.   Dietitian [phone]  DOCUMENTATION CODES Per approved criteria  -Severe malnutrition in the context of acute illness or injury    Francene Boyers 02/01/2011, 3:29 PM

## 2011-02-01 NOTE — Progress Notes (Signed)
Subjective: She is overall about the same. She is still coughing and congested. She is set for a swallowing evaluation today which I think is entirely appropriate. I will discuss with one of the infectious disease specialist to see what they think about treatment for potential mycobacterial infection.  Objective: Vital signs in last 24 hours: Temp:  [97.3 F (36.3 C)-98.2 F (36.8 C)] 98 F (36.7 C) (10/23 0531) Pulse Rate:  [71-86] 86  (10/23 0533) Resp:  [18-20] 18  (10/23 0533) BP: (83-124)/(54-74) 92/59 mmHg (10/23 0533) SpO2:  [94 %-100 %] 98 % (10/23 0724) Weight:  [43 kg (94 lb 12.8 oz)-44.4 kg (97 lb 14.2 oz)] 94 lb 12.8 oz (43 kg) (10/23 0531) Weight change: 0.4 kg (14.1 oz) Last BM Date: 01/30/11  Intake/Output from previous day: 10/22 0701 - 10/23 0700 In: 1781.7 [P.O.:240; I.V.:1291.7; IV Piggyback:250] Out: 3 [Urine:3]  PHYSICAL EXAM General appearance: alert, cooperative and mild distress Resp: rhonchi bilaterally Cardio: regular rate and rhythm, S1, S2 normal, no murmur, click, rub or gallop GI: soft, non-tender; bowel sounds normal; no masses,  no organomegaly Extremities: extremities normal, atraumatic, no cyanosis or edema  Lab Results:    Basic Metabolic Panel:  Basename 01/31/11 2348 01/31/11 0412  NA 136 135  K 3.7 3.7  CL 107 105  CO2 22 22  GLUCOSE 101* 90  BUN 14 20  CREATININE 0.80 0.92  CALCIUM 8.1* 8.5  MG -- --  PHOS 2.2* --   Liver Function Tests:  Basename 01/31/11 2348 01/30/11 1732  AST -- 40*  ALT -- 15  ALKPHOS -- 69  BILITOT -- 0.5  PROT -- 6.6  ALBUMIN 2.5* 3.5    Basename 01/30/11 1732  LIPASE 47  AMYLASE --   No results found for this basename: AMMONIA:2 in the last 72 hours CBC:  Basename 01/31/11 2348 01/31/11 0412 01/30/11 1732  WBC 4.9 4.6 --  NEUTROABS 2.9 -- 2.9  HGB 10.4* 11.3* --  HCT 30.0* 32.9* --  MCV 96.8 96.5 --  PLT 136* 153 --   Cardiac Enzymes:  Basename 01/30/11 1734  CKTOTAL 61  CKMB  2.4  CKMBINDEX --  TROPONINI <0.30   BNP:  Basename 02/01/11 0553  POCBNP 1764.0*   D-Dimer: No results found for this basename: DDIMER:2 in the last 72 hours CBG: No results found for this basename: GLUCAP:6 in the last 72 hours Hemoglobin A1C: No results found for this basename: HGBA1C in the last 72 hours Fasting Lipid Panel: No results found for this basename: CHOL,HDL,LDLCALC,TRIG,CHOLHDL,LDLDIRECT in the last 72 hours Thyroid Function Tests:  Basename 01/30/11 1732  TSH 5.756*  T4TOTAL --  FREET4 --  T3FREE --  THYROIDAB --   Anemia Panel: No results found for this basename: VITAMINB12,FOLATE,FERRITIN,TIBC,IRON,RETICCTPCT in the last 72 hours Urine Drug Screen:  Alcohol Level: No results found for this basename: ETH:2 in the last 72 hours Urinalysis:  Misc. Labs:  ABGS No results found for this basename: PHART,PCO2,PO2ART,TCO2,HCO3 in the last 72 hours CULTURES No results found for this or any previous visit (from the past 240 hour(s)). Studies/Results: Dg Chest 2 View  01/30/2011  *RADIOLOGY REPORT*  Clinical Data: Weakness.  Cough.  Gastritis.  Dehydration.  CHEST - 2 VIEW  Comparison: Chest CT 01/25/2011.  Findings: Underlying hyperinflation. Lateral view degraded by patient arm position.  Lower thoracic vertebral augmentation. Midline trachea.  Normal heart size and mediastinal contours. No pleural effusion or pneumothorax.  Vague reticulonodular opacity in the right upper lobe is similar  to on the 01/25/2011 CT.  Chronic.  There is also subtle reticular nodular opacity within the lingula.  No evidence of acute superimposed lobarconsolidation.  IMPRESSION: Probable acute/subacute atypical infection, as detailed on the 01/25/2011 CT.  No evidence of new or progressive pneumonia.  Original Report Authenticated By: Consuello Bossier, M.D.    Medications:  Scheduled:   . benzonatate  100 mg Oral TID  . chlorpheniramine-HYDROcodone  5 mL Oral Q12H  . folic acid  1  mg Oral Daily  . guaiFENesin  600 mg Oral BID  . levalbuterol  0.63 mg Nebulization Q6H  . moxifloxacin  400 mg Intravenous Q24H  . pantoprazole  40 mg Oral Daily  . saccharomyces boulardii  250 mg Oral BID  . simvastatin  40 mg Oral QHS  . DISCONTD: levalbuterol  0.63 mg Nebulization Q8H   Continuous:   . 0.9 % NaCl with KCl 20 mEq / L 40 mL/hr at 01/31/11 1543  . DISCONTD: sodium chloride 100 mL/hr at 01/31/11 0900  . DISCONTD: sodium chloride 0.9 % 1,000 mL infusion 100 mL/hr at 01/30/11 2300   EAV:[id number], acetaminophen, bisacodyl, docusate sodium, ondansetron (ZOFRAN) IV, ondansetron, sodium phosphate, traZODone  Assesment: She has an abnormal chest CT. She has had failure to thrive and has lost a great deal of weight. She does not fit the typical picture of someone with a Mycobacterium avium intracellular area infection because of the lack of COPD but she has had cough for about a year and weight loss which could of course go along with that. This could also be chronic aspiration Principal Problem:  *Dehydration Active Problems:  ATHEROSCLEROTIC CARDIOVASCULAR DISEASE  WEIGHT LOSS  Atypical pneumonia  Weakness generalized  HTN (hypertension), benign  Hyperlipidemia  Anemia, chronic disease  Acute renal failure (ARF)  Hypotension  Diarrhea due to drug  Chronic kidney disease (CKD), stage III (moderate)  Dyspnea    Plan: She'll have a swallowing evaluation I'll discuss with infectious disease.    LOS: 2 days   Kayla Lane L 02/01/2011, 8:37 AM

## 2011-02-01 NOTE — Procedures (Signed)
Modified Barium Swallow Procedure Note Patient Details  Name: Kayla Lane MRN: [id number] Date of Birth: October 16, 1925  Today's Date: 02/01/2011 Time:  - 10:30 am    Past Medical History:  Past Medical History  Diagnosis Date  . History of colon cancer      1996  . Coronary artery disease   . Myocardial infarction   . GERD (gastroesophageal reflux disease)   . DJD (degenerative joint disease)   . HTN (hypertension)   . Hyperlipidemia   . Spinal stenosis   . Colitis    Past Surgical History:  Past Surgical History  Procedure Date  . Abdominal hysterectomy   . Appendectomy   . Partial colectomy 1996    colon cancer  . Cardiac catheterization   . Back surgery   . Tonsillectomy   . Colonoscopy 01/2009    Dr. Bernita Buffy anastomosis at 18cm. Residual rectal and colonic mucosa appeared normal  . Esophagogastroduodenoscopy 01/2009    Dr. Carron Curie cervical esophageal web s/p dilation, small hh  . Esophagogastroduodenoscopy 11/2009    Dr. Ronni Rumble hh  . Colonoscopy 11/2009    Dr. Marlowe Alt papilla, surgical anastomosis at 18cm     Recommendation/Prognosis  Clinical Impression Dysphagia Diagnosis: Mild oral phase dysphagia;Mild pharyngeal phase dysphagia Clinical impression: Overall mild oropharyngeal phase dysphagia characterized by premature spillage of thin liquids, decreased hyolaryngeal excursion, with decreased protection of laryngeal vestibule resulting in trace penetration during the swallow with thin liquids and mild vallecular and lateral channel residiue which clears with repeat swallow. Recommendations Solid Consistency: Regular Liquid Consistency: Thin Liquid Administration via: Cup Medication Administration: Whole meds with liquid Supervision: Patient able to self feed Compensations: Small sips/bites;Multiple dry swallows after each bite/sip Postural Changes and/or Swallow Maneuvers: Out of bed for meals;Seated upright 90 degrees;Upright  30-60 min after meal Oral Care Recommendations: Oral care BID Other Recommendations: Clarify dietary restrictions Prognosis Prognosis for Safe Diet Advancement: Good Individuals Consulted Consulted and Agree with Results and Recommendations: Patient     General:  Date of Onset: 01/31/11 Other Pertinent Information: Mrs. Peltzer reports significant weight loss at home and currently has PNA. Type of Study: Initial MBS Diet Prior to this Study: Regular;Thin liquids Temperature Spikes Noted: No Respiratory Status: Room air History of Intubation: No Behavior/Cognition: Alert;Cooperative;Pleasant mood Oral Cavity - Dentition: Dentures, top;Dentures, bottom (partials) Oral Motor / Sensory Function: Within functional limits Vision: Functional for self-feeding Patient Positioning: Upright in chair Baseline Vocal Quality: Normal;Low vocal intensity Volitional Cough: Weak Volitional Swallow: Able to elicit Anatomy: Other (Comment) (presence of osteophyte in cervial esophagus region) Pharyngeal Secretions: Not observed secondary MBS Ice chips: Not tested  Reason for Referral:  Objectively evaluate swallow function due to concerns of possible aspiration and identify appropriate diet and compensatory strategies as needed.  Oral Phase Oral Assessment (Complete on admission/transfer/change in patient condition) Lips: Smooth, pink, moist Nutritional status: Good Oral Preparation/Oral Phase Oral Phase: WFL  Pharyngeal Phase  Pharyngeal Phase Pharyngeal Phase: Impaired Pharyngeal - Thin Pharyngeal - Thin Cup: Premature spillage to valleculae;Penetration/Aspiration during swallow;Pharyngeal residue - valleculae;Lateral channel residue;Reduced anterior laryngeal mobility Penetration/Aspiration details (thin cup): Material enters airway, remains ABOVE vocal cords then ejected out Pharyngeal - Thin Straw: Premature spillage to valleculae;Reduced anterior laryngeal mobility;Penetration/Aspiration  during swallow;Pharyngeal residue - valleculae;Lateral channel residue Penetration/Aspiration details (thin straw): Material enters airway, remains ABOVE vocal cords then ejected out Pharyngeal Phase - Comment Pharyngeal Comment: Mild oropharyngeal dysphagia; trace penetration of thins with large and multiple sips (underepiglottic coating)  Cervical Esophageal Phase  Cervical  Esophageal Phase Cervical Esophageal Phase: WFL (Presence of osteophyte identified in cervical esophagus regi)  Continue diet as ordered. No further speech follow up is recommended. Study was reviewed with patient.  Thank you, Havery Moros [phone]  Thong Feeny 02/01/2011, 12:36 PM

## 2011-02-01 NOTE — Progress Notes (Signed)
Physical Therapy Evaluation Patient Details Name: Kayla Lane MRN: [id number] DOB: 1925/06/26 Today's Date: 02/01/2011  Problem List:  Patient Active Problem List  Diagnoses  . ADENOCARCINOMA, COLON  . ATHEROSCLEROTIC CARDIOVASCULAR DISEASE  . CAROTID ARTERY STENOSIS, BILATERAL  . CEREBROVASCULAR DISEASE  . GERD  . COLITIS  . DEGENERATIVE JOINT DISEASE  . WEIGHT LOSS  . PUD, HX OF  . GASTROESOPHAGEAL REFLUX DISEASE, HX OF  . Anemia  . Atypical pneumonia  . Weakness generalized  . Dehydration  . HTN (hypertension), benign  . Hyperlipidemia  . Anemia, chronic disease  . Acute renal failure (ARF)  . Hypotension  . Diarrhea due to drug  . Chronic kidney disease (CKD), stage III (moderate)  . Dyspnea  . Abnormal TSH  . Dysphagia, oropharyngeal phase    Past Medical History:  Past Medical History  Diagnosis Date  . History of colon cancer      1996  . Coronary artery disease   . Myocardial infarction   . GERD (gastroesophageal reflux disease)   . DJD (degenerative joint disease)   . HTN (hypertension)   . Hyperlipidemia   . Spinal stenosis   . Colitis    Past Surgical History:  Past Surgical History  Procedure Date  . Abdominal hysterectomy   . Appendectomy   . Partial colectomy 1996    colon cancer  . Cardiac catheterization   . Back surgery   . Tonsillectomy   . Colonoscopy 01/2009    Dr. Bernita Buffy anastomosis at 18cm. Residual rectal and colonic mucosa appeared normal  . Esophagogastroduodenoscopy 01/2009    Dr. Carron Curie cervical esophageal web s/p dilation, small hh  . Esophagogastroduodenoscopy 11/2009    Dr. Ronni Rumble hh  . Colonoscopy 11/2009    Dr. Clemon Chambers, surgical anastomosis at 18cm    PT Assessment/Plan/Recommendation PT Assessment Clinical Impression Statement: Pt is a high fall risk if she is not using an assistive device at this time.  She was evaluated with a rolling walker and appears safe with this  assistive device.  The patient will be assessed tomorrow with a cane as the patient  enjoys going outside. PT Recommendation/Assessment: Patient will need skilled PT in the acute care venue PT Problem List: Decreased strength;Decreased balance;Decreased knowledge of use of DME PT Therapy Diagnosis : Generalized weakness PT Plan PT Frequency: Min 5X/week PT Treatment/Interventions: DME instruction;Gait training;Therapeutic exercise;Balance training PT Recommendation Follow Up Recommendations: Home health PT Equipment Recommended: Rolling walker with 5" wheels PT Goals  Acute Rehab PT Goals PT Goal Formulation: With patient Time For Goal Achievement: 3 days Pt will Ambulate: >150 feet;with least restrictive assistive device Pt will Go Up / Down Stairs: 1-2 stairs;with supervision;with rail(s) Pt will Perform Home Exercise Program: Independently  PT Evaluation Precautions/Restrictions  Precautions Precautions: Fall Required Braces or Orthoses: No Restrictions Weight Bearing Restrictions: No Prior Functioning  Home Living Lives With: Alone Receives Help From: Friend(s) Type of Home: House Home Layout: One level Home Access: Stairs to enter Entrance Stairs-Rails: Right Entrance Stairs-Number of Steps: 2 Bathroom Shower/Tub: Engineer, manufacturing systems: Standard Prior Function Level of Independence: Independent with basic ADLs Driving: No Vocation: Retired Producer, television/film/video: Awake/alert Overall Cognitive Status: Appears within functional limits for tasks assessed Sensation/Coordination Coordination Gross Motor Movements are Fluid and Coordinated: Yes Fine Motor Movements are Fluid and Coordinated: Not tested Extremity Assessment   Mobility (including Balance) Bed Mobility Bed Mobility: Yes Supine to Sit: 7: Independent Transfers Transfers: Yes Stand to Sit: 6: Modified independent (Device/Increase  time) Ambulation/Gait Ambulation/Gait:  Yes Ambulation/Gait Assistance: 6: Modified independent (Device/Increase time) Ambulation Distance (Feet): 120 Feet Assistive device: Rolling walker Gait Pattern: Within Functional Limits Gait velocity: normal Stairs: No Wheelchair Mobility Wheelchair Mobility: No  Balance Balance Assessed: Yes Static Standing Balance Static Standing - Balance Support: No upper extremity supported Static Standing - Level of Assistance: 5: Stand by assistance Static Standing - Comment/# of Minutes: 1 Dynamic Standing Balance Dynamic Standing - Balance Support: No upper extremity supported Dynamic Standing - Level of Assistance: 3: Mod assist (Pt needs assistive device to ambulate safely) Exercise    End of Session PT - End of Session Equipment Utilized During Treatment: Gait belt Activity Tolerance: Patient tolerated treatment well Patient left: in chair;with call bell in reach;with family/visitor present Nurse Communication: Mobility status for transfers General Behavior During Session: [facility] for tasks performed Cognition: [facility] for tasks performed  RUSSELL,CINDY 02/01/2011, 2:22 PM

## 2011-02-01 NOTE — Progress Notes (Signed)
Subjective: Up in chair, endorses continued weakness and ? Of dizziness when standing- note SBP down to 90's this am. Now having intermittent productive cough- yellow.  Attending: The patient says that she is much improved compared to yesterday, although, she still has a productive cough. She feels as if she has less congestion.  Objective: Vital signs in last 24 hours: Temp:  [97.3 F (36.3 C)-98.2 F (36.8 C)] 98 F (36.7 C) (10/23 0531) Pulse Rate:  [71-86] 86  (10/23 0533) Resp:  [18-20] 18  (10/23 0533) BP: (83-124)/(54-74) 92/59 mmHg (10/23 0533) SpO2:  [94 %-100 %] 98 % (10/23 0724) Weight:  [43 kg (94 lb 12.8 oz)-44.4 kg (97 lb 14.2 oz)] 94 lb 12.8 oz (43 kg) (10/23 0531) Weight change: 0.4 kg (14.1 oz) Last BM Date: 01/30/11  Intake/Output from previous day: 10/22 0701 - 10/23 0700 In: 1781.7 [P.O.:240; I.V.:1291.7; IV Piggyback:250] Out: 3 [Urine:3] Intake/Output this shift:    General appearance: alert, cooperative, appears stated age and no distress Resp: soft expiratory crackles bilaterally on posterior exam, nonlabored, RA, no cough at present Cardio: regular rate and rhythm, S1, S2 normal, no murmur, click, rub or gallop. IV fluids at 40/hr GI: soft, non-tender; bowel sounds normal; no masses,  no organomegaly Extremities: extremities normal, atraumatic, no cyanosis or edema Pulses: 2+ and symmetric Neurologic: Alert and oriented X 3, normal strength and tone. Normal symmetric reflexes. Normal coordination and gait  Lab Results:  Basename 01/31/11 2348 01/31/11 0412  WBC 4.9 4.6  HGB 10.4* 11.3*  HCT 30.0* 32.9*  PLT 136* 153   BMET  Basename 01/31/11 2348 01/31/11 0412  NA 136 135  K 3.7 3.7  CL 107 105  CO2 22 22  GLUCOSE 101* 90  BUN 14 20  CREATININE 0.80 0.92  CALCIUM 8.1* 8.5    Studies/Results: Dg Chest 2 View  01/30/2011  *RADIOLOGY REPORT*  Clinical Data: Weakness.  Cough.  Gastritis.  Dehydration.  CHEST - 2 VIEW  Comparison: Chest CT  01/25/2011.  Findings: Underlying hyperinflation. Lateral view degraded by patient arm position.  Lower thoracic vertebral augmentation. Midline trachea.  Normal heart size and mediastinal contours. No pleural effusion or pneumothorax.  Vague reticulonodular opacity in the right upper lobe is similar to on the 01/25/2011 CT.  Chronic.  There is also subtle reticular nodular opacity within the lingula.  No evidence of acute superimposed lobarconsolidation.  IMPRESSION: Probable acute/subacute atypical infection, as detailed on the 01/25/2011 CT.  No evidence of new or progressive pneumonia.  Original Report Authenticated By: Consuello Bossier, M.D.    Medications: I have reviewed the patient's current medications.  Assessment/Plan: Patient Active Hospital Problem List: Dehydration (01/30/2011)    POA: Yes  Plan: Resolving- continue gentle IVF for now- likley 2/2 diarrhea preadmit in association with use of medicine (HCTZ)  Diarrhea due to drug: (01/31/2011): Resolved- ? Has c. difficile colitis- PCR pending  WEIGHT LOSS (12/25/2009)    POA: Yes Plan: Will ask nutrition to consult this admission and check prealbumin.  Atypical pneumonia vs chronic bronchitis (01/30/2011)    POA: Yes  Plan: Currently not hypoxic but has developed crackles after hydration- has atypical pneumonia changes initially seen on CT chest July 2011; pulmonary medicine consult [facility] following this admission and is ruling out mycobacterium etiology (weight loss and chronic cough x 1 year)  but also agrees dysphagia/silent aspiration also likely cause. Changed to Avelox 01/31/2011. Continue Xopenex  Atherosclerotic heart disease: (01/31/2011) No symptoms but ongoing dyspnea could be  ischemic equivalent- review records in echart show last ECHO 2009 with normal LV function and mild/moderate mitral regurg so need to r/o valvular heart disease as etiology- will repeat ECHO this admission consider cards consult this admission- proBNP  1700 range- pt is due follow up visit with [facility] this week  Dyspnea: (10/22/20120: See above  Weakness generalized (01/30/2011)    POA: Yes  Plan:  PT/OT evaluation  HTN (hypertension), benign (01/30/2011)    POA: Yes   Plan: Continues with relative hypotension  and has mild symptomatic orthostasis so home anti-HTN meds on hold  Hyperlipidemia (01/30/2011)    POA: Yes   Plan: Stable  Anemia, chronic disease (01/31/2011)    POA: Yes   Plan: Continue follow CBC- no signs active bleeding-  anemia panel Sept 2012 no significant abnormalities; TSH mildly elevated- will check free T4 and T3 - likely is sick euthyroid syndrome- consider check FOB x 3 (was heme positive 01/07/2011) has h/o colitis and prior colon resection  Acute renal failure (ARF) (01/31/2011)    POA: Yes   Plan: Resolved after adequately hydrated and offending meds  (ARB/diuretics, etc) held and BP restored to normotensive state  Chronic kidney disease stage 3: Crcl on current cr of.95 = 32 with IBW 132 lbs- baseline creatinine unknown  Hypotension (01/31/2011)    POA: Yes   Plan: Resolved after hydration and BP meds held- check orthostatic VS prn  Dysphagia (02/01/2011): Overall mild oropharyngeal phase dysphagia with trace penetration Plan; Follow SLP recommendations: Solid Consistency: Regular  Liquid Consistency: Thin  Liquid Administration via: Cup  Medication Administration: Whole meds with liquid  Supervision: Patient able to self feed  Compensations: Small sips/bites;Multiple dry swallows after each bite/sip  Postural Changes and/or Swallow Maneuvers: Out of bed for meals;Seated upright 90 degrees;Upright 30-60 min after meal  Oral Care Recommendations: Oral care BID   Disposition: Remain IP. Follow up on AM labs and pulmonologist recommendations.     Attending: The patient was examined and seen. Chart, labs, and vitals reviewed. Agree with Ms. Rennis Harding' excellent assessment and plan. Dr. Juanetta Gosling  assessment is noted and appreciated. Will await the outcome of his conversation with the infection diseases physician at [facility].     LOS: 2 days   ELLIS,ALLISON L. 02/01/2011, 9:25 AM

## 2011-02-02 ENCOUNTER — Inpatient Hospital Stay
Admission: EM | Admit: 2011-02-02 | Payer: Self-pay | Source: Other Acute Inpatient Hospital | Admitting: Internal Medicine

## 2011-02-02 ENCOUNTER — Inpatient Hospital Stay (HOSPITAL_COMMUNITY)
Admission: AD | Admit: 2011-02-02 | Discharge: 2011-02-07 | DRG: 981 | Disposition: A | Discharge status: Skilled Nursing Facility | Payer: Medicare Other | Source: Ambulatory Visit | Attending: Cardiology | Admitting: Cardiology

## 2011-02-02 DIAGNOSIS — I2510 Atherosclerotic heart disease of native coronary artery without angina pectoris: Secondary | ICD-10-CM

## 2011-02-02 DIAGNOSIS — R079 Chest pain, unspecified: Secondary | ICD-10-CM

## 2011-02-02 DIAGNOSIS — I2109 ST elevation (STEMI) myocardial infarction involving other coronary artery of anterior wall: Secondary | ICD-10-CM | POA: Diagnosis not present

## 2011-02-02 HISTORY — PX: CORONARY STENT PLACEMENT: SHX1402

## 2011-02-02 LAB — RENAL FUNCTION PANEL
BUN: 9 mg/dL (ref 6–23)
CO2: 24 mEq/L (ref 19–32)
Calcium: 8.2 mg/dL — ABNORMAL LOW (ref 8.4–10.5)
Glucose, Bld: 103 mg/dL — ABNORMAL HIGH (ref 70–99)
Phosphorus: 2.7 mg/dL (ref 2.3–4.6)
Potassium: 3.7 mEq/L (ref 3.5–5.1)

## 2011-02-02 LAB — CBC
HCT: 32.6 % — ABNORMAL LOW (ref 36.0–46.0)
Hemoglobin: 11.1 g/dL — ABNORMAL LOW (ref 12.0–15.0)
MCH: 33.2 pg (ref 26.0–34.0)
MCHC: 34 g/dL (ref 30.0–36.0)
RBC: 3.34 MIL/uL — ABNORMAL LOW (ref 3.87–5.11)

## 2011-02-02 LAB — CARDIAC PANEL(CRET KIN+CKTOT+MB+TROPI)
Relative Index: INVALID (ref 0.0–2.5)
Troponin I: 0.47 ng/mL (ref <0.30)

## 2011-02-02 LAB — MRSA PCR SCREENING: MRSA by PCR: NEGATIVE

## 2011-02-02 LAB — POCT ACTIVATED CLOTTING TIME: Activated Clotting Time: 314 seconds

## 2011-02-02 MED ORDER — ASPIRIN 325 MG PO TABS
325.0000 mg | ORAL_TABLET | Freq: Every day | ORAL | Status: DC
  Administered 2011-02-02: 325 mg via ORAL
  Filled 2011-02-02: qty 1

## 2011-02-02 MED ORDER — POTASSIUM CHLORIDE IN NACL 20-0.9 MEQ/L-% IV SOLN: INTRAVENOUS | Status: DC

## 2011-02-02 MED ORDER — METOPROLOL TARTRATE 25 MG PO TABS
12.5000 mg | ORAL_TABLET | Freq: Two times a day (BID) | ORAL | Status: DC
  Administered 2011-02-02: 12.5 mg via ORAL
  Filled 2011-02-02: qty 1

## 2011-02-02 MED ORDER — IPRATROPIUM BROMIDE 0.02 % IN SOLN: 0.5000 mg | Freq: Four times a day (QID) | RESPIRATORY_TRACT | Status: DC

## 2011-02-02 MED ORDER — NITROGLYCERIN 0.4 MG SL SUBL
0.4000 mg | SUBLINGUAL_TABLET | SUBLINGUAL | Status: DC | PRN
  Administered 2011-02-02 (×2): 0.4 mg via SUBLINGUAL

## 2011-02-02 MED ORDER — NITROGLYCERIN 2 % TD OINT
1.0000 [in_us] | TOPICAL_OINTMENT | Freq: Four times a day (QID) | TRANSDERMAL | Status: DC
  Administered 2011-02-02: 1 [in_us] via TOPICAL

## 2011-02-02 MED ORDER — NITROGLYCERIN IN D5W 200-5 MCG/ML-% IV SOLN
INTRAVENOUS | Status: AC
  Administered 2011-02-02: 50000 ug
  Filled 2011-02-02: qty 250

## 2011-02-02 MED ORDER — MOXIFLOXACIN HCL 400 MG PO TABS: 400.0000 mg | ORAL_TABLET | Freq: Every day | ORAL | Status: DC

## 2011-02-02 MED ORDER — SODIUM CHLORIDE 0.9 % IJ SOLN
INTRAMUSCULAR | Status: AC
  Administered 2011-02-02: 13:00:00
  Filled 2011-02-02: qty 10

## 2011-02-02 MED ORDER — NITROGLYCERIN 0.4 MG SL SUBL
SUBLINGUAL_TABLET | SUBLINGUAL | Status: AC
  Administered 2011-02-02: 11:00:00
  Filled 2011-02-02: qty 25

## 2011-02-02 MED ORDER — HEPARIN (PORCINE) IN NACL 100-0.45 UNIT/ML-% IJ SOLN
16.0000 [IU]/kg/h | INTRAMUSCULAR | Status: DC
  Administered 2011-02-02: 16 [IU]/kg/h via INTRAVENOUS
  Filled 2011-02-02: qty 250

## 2011-02-02 MED ORDER — PANTOPRAZOLE SODIUM 40 MG PO TBEC: 40.0000 mg | DELAYED_RELEASE_TABLET | Freq: Two times a day (BID) | ORAL | Status: DC

## 2011-02-02 MED ORDER — NITROGLYCERIN 2 % TD OINT
TOPICAL_OINTMENT | TRANSDERMAL | Status: AC
  Administered 2011-02-02: 1 [in_us] via TOPICAL
  Filled 2011-02-02: qty 1

## 2011-02-02 NOTE — Progress Notes (Signed)
Pt was complaining of chest pain, NTG 0.4mg  SL given x 3 doses, chest pain relieved, oxygen @ 2L/[place] applied during chest pain episode, pt described pain as heaviness on chest, rating the highest as 6/10.

## 2011-02-02 NOTE — Progress Notes (Signed)
CRITICAL VALUE ALERT  Critical value received: Troponin 0.47  Date of notification: 02/02/11  Time of notification: 1035 He is Critical value read back:Yes  Nurse who received alert:  L. Masayo Fera,RN  MD notified (1st page): Dr. Jerral Ralph  Time of first page:1035  MD notified (2nd page):  Time of second page:  Responding MD: Dr. Jerral Ralph  Time MD responded: [phone]

## 2011-02-02 NOTE — Consult Note (Signed)
ANTICOAGULATION CONSULT NOTE - Initial Consult  Pharmacy Consult for IV Heparin Indication: chest pain/ACS  Allergies  Allergen Reactions  . Penicillins     REACTION: Unknown reaction  . Sulfonamide Derivatives     REACTION: Unknown reaction  . Tetracycline     REACTION: Unknown reaction   Patient Measurements: Height: 5\' 1"  (154.9 cm) Weight: 100 lb (45.36 kg) IBW/kg (Calculated) : 47.8   Vital Signs: Temp: 97.5 F (36.4 C) (10/24 0958) Temp src: Oral (10/24 0958) BP: 124/77 mmHg (10/24 1058) Pulse Rate: 87  (10/24 1058)  Labs:  Alvira Philips 02/02/11 0934 02/02/11 0102 02/02/11 0101 01/31/11 2348 01/31/11 0412 01/30/11 1734 01/30/11 1732  HGB -- -- 11.1* 10.4* -- -- --  HCT -- -- 32.6* 30.0* 32.9* -- --  PLT -- -- 140* 136* 153 -- --  APTT -- -- -- -- -- -- 26  LABPROT -- -- -- -- -- -- 12.4  INR -- -- -- -- -- -- 0.91  HEPARINUNFRC -- -- -- -- -- -- --  CREATININE -- 0.80 -- 0.80 0.92 -- --  CKTOTAL 70 -- -- -- -- 61 --  CKMB 4.9* -- -- -- -- 2.4 --  TROPONINI 0.47* -- -- -- -- <0.30 --   Estimated Creatinine Clearance: 38.2 ml/min (by C-G formula based on Cr of 0.8).  Medical History: Past Medical History  Diagnosis Date  . History of colon cancer      1996  . Coronary artery disease   . Myocardial infarction   . GERD (gastroesophageal reflux disease)   . DJD (degenerative joint disease)   . HTN (hypertension)   . Hyperlipidemia   . Spinal stenosis   . Colitis    Medications:  Scheduled:    . aspirin  325 mg Oral Daily  . benzonatate  100 mg Oral TID  . chlorpheniramine-HYDROcodone  5 mL Oral Q12H  . feeding supplement  1 Container Oral TID WC  . folic acid  1 mg Oral Daily  . guaiFENesin  600 mg Oral BID  . ipratropium  0.5 mg Nebulization Q6H  . metoprolol tartrate  12.5 mg Oral BID  . moxifloxacin  400 mg Oral q1800  . nitroGLYCERIN      . pantoprazole  40 mg Oral BID AC  . saccharomyces boulardii  250 mg Oral BID  . simvastatin  40 mg Oral  QHS  . DISCONTD: levalbuterol  0.63 mg Nebulization Q6H  . DISCONTD: moxifloxacin  400 mg Intravenous Q24H  . DISCONTD: pantoprazole  40 mg Oral Daily   Assessment: ACS/STEMI  Goal of Therapy:  Heparin level 0.3-0.7 units/ml   Plan: IV Heparin at 16 units/kg/hr Check level in 6 hours and adjust as needed Heparin level daily CBC per protocol  Valrie Hart A 02/02/2011,11:13 AM

## 2011-02-02 NOTE — Progress Notes (Signed)
Pt complaining of chest pain 4/10, heaviness & pressure on chest, Dr. Robb Matar notified, orders received.

## 2011-02-02 NOTE — Progress Notes (Signed)
She had her swallowing study done yesterday and it showed that she does have some aspiration potential. I think that very well may be the problem with her chronic cough and congestion. I discussed her situation by phone with infectious disease in [place] and they confirmed my feeling that the abnormality on chest x-ray is not pathognomonic of a MAC infection and could be from multiple problems. A MAC infection is difficult to diagnose and the treatments is difficult to tolerate and there is very little evidence that the treatment actually cured the infection. It requires several cultures to be positive over a period of time before treatment should be initiated. I would attempt to get her to provide expectorated sputum first rather than go to bronchoscopy because she is fairly frail and the yield from bronchoscopy would be fairly small. My plan at this point then would be not to treat her presumptively for MAC, to try to obtain sputum specimens, follow her in my office, try to optimize her pulmonary situation and her swallowing situation. She is complaining of chest discomfort after having had a nebulizer treatment this morning so I'm going to have her get an EKG to make sure she's not having some sort of an ischemic event

## 2011-02-02 NOTE — Progress Notes (Signed)
Transferred to [facility] Cath lab, condition stable without chest pain at transfer, out via stretcher by [facility], daughter at bedside and instructed to go to cath lab, pt on nitroglycerin & heparin drips & oxygen at time of transport.

## 2011-02-02 NOTE — Progress Notes (Addendum)
Subjective: Some vague chest discomfort, but anxious to go home. Diarrhea completely resolved.  Objective: Vital signs in last 24 hours: Temp:  [97.5 F (36.4 C)-98.2 F (36.8 C)] 97.7 F (36.5 C) (10/24 0554) Pulse Rate:  [76-92] 92  (10/24 0558) Resp:  [18-20] 20  (10/24 0554) BP: (91-167)/(59-85) 122/81 mmHg (10/24 0558) SpO2:  [94 %-100 %] 98 % (10/24 0739) Weight:  [45.36 kg (100 lb)] 100 lb (45.36 kg) (10/24 0739) Weight change:  Last BM Date: 01/30/11  Intake/Output from previous day: 10/23 0701 - 10/24 0700 In: 1420 [P.O.:840; I.V.:330; IV Piggyback:250] Out: 600 [Urine:600] Intake/Output this shift:    General appearance: alert, cooperative, appears stated age and no distress Resp: soft expiratory crackles bilaterally on posterior exam, nonlabored, RA, no cough at present Cardio: regular rate and rhythm, S1, S2 normal, no murmur, click, rub or gallop. IV fluids at 40/hr GI: soft, non-tender; bowel sounds normal; no masses,  no organomegaly Extremities: extremities normal, atraumatic, no cyanosis or edema Pulses: 2+ and symmetric Neurologic: Alert and oriented X 3, normal strength and tone. Normal symmetric reflexes. Normal coordination and gait  Lab Results:  Basename 02/02/11 0101 01/31/11 2348  WBC 5.1 4.9  HGB 11.1* 10.4*  HCT 32.6* 30.0*  PLT 140* 136*   BMET  Basename 02/02/11 0102 01/31/11 2348  NA 139 136  K 3.7 3.7  CL 105 107  CO2 24 22  GLUCOSE 103* 101*  BUN 9 14  CREATININE 0.80 0.80  CALCIUM 8.2* 8.1*    Studies/Results: Dg Swallowing Function Complete Tuesday Late Morning (10:30?)  02/01/2011  *RADIOLOGY REPORT*  Clinical Data: Pneumonia, weight loss, failure to thrive  SWALLOWING FUNCTION STUDY:  Technique:  Patient swallowed varying consistencies of barium with the assistance of a speech pathologist.  I performed real-time fluoroscopic assessment in the lateral projection.  Fluoroscopy time:  1.3 minutes  Comparison:  None  Findings:  With applesauce consistency, normal motion is seen without laryngeal penetration or aspiration.  With cracker consistency, normal motion is seen without laryngeal penetration or aspiration. No significant residuals identified.  With thin barium by cup, initial swallow demonstrated normal motion without penetration or aspiration.  No residuals. The second swallow demonstrated flash laryngeal penetration without aspiration.  Sequential swallows of thin barium by straw showed multiple episodes of flash laryngeal penetration without aspiration or residuals.  The patient swallowed the 12.5 mm diameter barium tablet with thin barium without difficulty.  IMPRESSION: Repeated flash laryngeal penetration of thin barium as above without aspiration.  Original Report Authenticated By: Lollie Marrow, M.D.    Medications: I have reviewed the patient's current medications.  Assessment/Plan: Patient Active Hospital Problem List: Dehydration (01/30/2011)    POA: Yes  Plan: resolved, d/c all IVF  Diarrhea due to drug: (01/31/2011): Resolved  WEIGHT LOSS (12/25/2009)    POA: Yes Plan:appreciate nutrition evaluation, now on resource  Atypical pneumonia vs chronic bronchitis (01/30/2011)    POA: Yes  Plan: Currently not hypoxic but has developed crackles after hydration- has atypical pneumonia changes initially seen on CT chest July 2011; pulmonary medicine consult [facility] following this admission and is ruling out mycobacterium etiology (weight loss and chronic cough x 1 year)  but also agrees dysphagia/silent aspiration also likely cause. Changed to Avelox 01/31/2011. Continue Xopenex. Per Dr Juanetta Gosling, before pursuing bronchoscopy, he suggests to get sputum samples.  Atherosclerotic heart disease: (01/31/2011) Having some chest discomfort, given her history of CAD-will cycle enzymes. EKG negative. Await 2 D Echo. Will consult Cards- as  patient was due to see Dr Dietrich Pates this week anyway.   Dyspnea:  (10/22/20120: See above, await 2 D Echo  Weakness generalized (01/30/2011)    POA: Yes  Plan:  PT/OT evaluation-recommending HHOT  HTN (hypertension), benign (01/30/2011)    POA: Yes   Plan: Continues with relative hypotension  and has mild symptomatic orthostasis so home anti-HTN meds on hold  Hyperlipidemia (01/30/2011)    POA: Yes   Plan: Stable  Anemia, chronic disease (01/31/2011)    POA: Yes   Plan: Continue follow CBC- no signs active bleeding-  anemia panel Sept 2012 no significant abnormalities;   TSH mildly elevated- free T4 and T3 pending - likel subclinical hypothyroidism  Acute renal failure (ARF) (01/31/2011)    POA: Yes   Plan: Resolved after adequately hydrated and offending meds  (ARB/diuretics, etc) held and BP restored to normotensive state. Stop IVF.   Chronic kidney disease stage 3: Crcl on current cr of.95 = 32 with IBW 132 lbs- baseline creatinine unknown  Hypotension (01/31/2011)    POA: Yes   Plan: Resolved after hydration and BP meds held- check orthostatic VS prn  Dysphagia (02/01/2011): Overall mild oropharyngeal phase dysphagia with trace penetration Plan; Follow SLP recommendations: Solid Consistency: Regular  Liquid Consistency: Thin  Liquid Administration via: Cup  Medication Administration: Whole meds with liquid  Supervision: Patient able to self feed  Compensations: Small sips/bites;Multiple dry swallows after each bite/sip  Postural Changes and/or Swallow Maneuvers: Out of bed for meals;Seated upright 90 degrees;Upright 30-60 min after meal  Oral Care Recommendations: Oral care BID   Disposition: Remain IP. Await sputum collection.     LOS: 3 days   [facility] 02/02/2011, 9:15 AM  Patient Troponin's came back elevated, still having mild chest discomfort. Have placed a cardiology consultation. ASA, Statin,Beta Blockers, Lovenox have been added. Will give prn nitro to see chest pain relieved.

## 2011-02-02 NOTE — Consult Note (Addendum)
HPI: This is an 77 year old female patient with history of coronary artery disease status post stenting of the RCA and  LAD at [facility] in 1994. Her last stress nuclear study was in 1998 and was normal. Last catheter in 2007 showed a 40% LAD and 70% circumflex. She was last seen by Dr. Dietrich Pates in 2011.  The patient was admitted with atypical pneumonia versus bronchitis. Last night she developed chest pain after her breathing treatment and her troponin came back at 0.47 with an elevated CK-MB 4.9.2-D echo today does not show wall motion abnormality and shows normal LV function. See below for details.  Patient describes her chest pain as a chest heaviness radiating to both her shoulders. The pain release him with sublingual nitroglycerin but never totally goes away. A scale of 1-10 her pain is rated as a 4 at the present time. It has been as high as an 8. She denies chest pain prior to admission.  Allergies  Allergen Reactions  . Penicillins     REACTION: Unknown reaction  . Sulfonamide Derivatives     REACTION: Unknown reaction  . Tetracycline     REACTION: Unknown reaction    No current facility-administered medications on file prior to encounter.   Current Outpatient Prescriptions on File Prior to Encounter  Medication Sig Dispense Refill  . aspirin EC 81 MG tablet Take 81 mg by mouth at bedtime.        . bisoprolol-hydrochlorothiazide (ZIAC) 5-6.25 MG per tablet Take 1 tablet by mouth at bedtime.        . calcium-vitamin D (OSCAL WITH D) 500-200 MG-UNIT per tablet Take 1 tablet by mouth daily.        Marland Kitchen HYDROcodone-acetaminophen (NORCO) 5-325 MG per tablet Take 1 tablet by mouth every 6 (six) hours as needed. For pain       . losartan (COZAAR) 100 MG tablet Take 100 mg by mouth daily.        Marland Kitchen PREVACID 24HR 15 MG capsule TAKE ONE CAPSULE DAILY.  31 capsule  3  . saccharomyces boulardii (FLORASTOR) 250 MG capsule Take 1 capsule (250 mg total) by mouth 2 (two) times daily.  62 capsule  0  .  simvastatin (ZOCOR) 40 MG tablet Take 40 mg by mouth at bedtime.        . vitamin B-12 (CYANOCOBALAMIN) 100 MCG tablet Take 50 mcg by mouth daily.          Past Medical History  Diagnosis Date  . History of colon cancer      1996  . Coronary artery disease   . Myocardial infarction   . GERD (gastroesophageal reflux disease)   . DJD (degenerative joint disease)   . HTN (hypertension)   . Hyperlipidemia   . Spinal stenosis   . Colitis     Past Surgical History  Procedure Date  . Abdominal hysterectomy   . Appendectomy   . Partial colectomy 1996    colon cancer  . Cardiac catheterization   . Back surgery   . Tonsillectomy   . Colonoscopy 01/2009    Dr. Bernita Buffy anastomosis at 18cm. Residual rectal and colonic mucosa appeared normal  . Esophagogastroduodenoscopy 01/2009    Dr. Carron Curie cervical esophageal web s/p dilation, small [facility]  . Esophagogastroduodenoscopy 11/2009    Dr. Ronni Rumble [facility]  . Colonoscopy 11/2009    Dr. Marlowe Alt papilla, surgical anastomosis at 18cm    Family History  Problem Relation Age of Onset  . Diabetes type II Other   .  Kidney cancer Brother   . Hypertension Mother   . Diabetes Sister     History   Social History  . Marital Status: Widowed    Spouse Name: N/A    Number of Children: N/A  . Years of Education: N/A   Occupational History  . Not on file.   Social History Main Topics  . Smoking status: Never Smoker   . Smokeless tobacco: Never Used  . Alcohol Use: No  . Drug Use: No  . Sexually Active: No   Other Topics Concern  . Not on file   Social History Narrative  . No narrative on file    ROS: See HPI Eyes: Negative Ears:Negative for hearing loss, tinnitus Cardiovascular: Negative for chest pain, palpitations,irregular heartbeat, dyspnea, dyspnea on exertion, near-syncope, orthopnea, paroxysmal nocturnal dyspnia and syncope,edema, claudication, cyanosis,.  Respiratory:   Negative for cough,  hemoptysis, shortness of breath, sleep disturbances due to breathing, sputum production and wheezing.   Endocrine: Negative for cold intolerance and heat intolerance.  Hematologic/Lymphatic: Negative for adenopathy and bleeding problem. Does not bruise/bleed easily.  Musculoskeletal: Negative.   Gastrointestinal: history of colon cancer and colitis, no active bleedingNegative for nausea, vomiting, reflux, abdominal pain, diarrhea, constipation.   Neurological: Negative.  Allergic/Immunologic: Negative for environmental allergies.   PHYSICAL EXAM: Well-nournished, in no acute distress. HEENT:Head is normocephalic without sign of trauma, extraocular eye movements are intact, pupils were equal and reactive to light accommodation, nasal mucosa is moist, throat is without erythema or exudate Neck: No JVD, HJR, Bruit, or thyroid enlargement Lungs: decreased breath sounds with scattered expiratory wheezes and rhonchi No tachypnea, Cardiovascular: RRR, PMI not displaced, 1/6 systolic murmur at the lesser border, no bruit, thrill, or heave. Abdomen: BS normal. Soft without organomegaly, masses, lesions or tenderness. Extremities: without cyanosis, clubbing or edema. Good distal pulses bilateral SKin: Warm, no lesions or rashes  Musculoskeletal: No deformities Neuro: no focal signs  BP 94/59  Pulse 94  Temp(Src) 97.5 F (36.4 C) (Oral)  Resp 20  Ht 5\' 1"  (1.549 m)  Wt 100 lb (45.36 kg)  BMI 18.89 kg/m2  SpO2 99%     ECHO 02/02/2011 Study Conclusions  - Left ventricle: The cavity size was normal. There was mild concentric hypertrophy. Systolic function was normal. The estimated ejection fraction was in the range of 55% to 60%. Wall motion was normal; there were no regional wall motion abnormalities. - Aortic valve: Mildly calcified annulus. Mildly thickened, mildly calcified leaflets. Trivial regurgitation. - Mitral valve: Calcified annulus. Mildly thickened leaflets . - Atrial  septum: No defect or patent foramen ovale was identified. - Pulmonary arteries: Systolic pressure was mildly increased. PA peak pressure: 45mm Hg (S). Impressions:  - Compared to the prior study performed in 06/2007, AV sclerosis now present, and MR has decreased.   ZOX:WRUEAV sinus rhythm with developing injury occurring anterolaterally when compared to EKG tracing from this morning  ASSESSMENT AND PLAN: Chest pain: Patient has slightly elevated cardiac enzymes and continues to have ongoing chest pain despite IV heparin, sublingual nitroglycerin, and nitro paste.We will start IV nitroglycerin and have arranged for her to transfer to [facility] for cardiac catheterization today. Her and her daughter have been spoken to at length by myself and Dr. Diona Browner and are in agreement to transfer via [facility] and undergo cardiac catheterization. WUJ:WJXBJY post stenting of the LAD and RCA at [facility] in 1994. Her last stress nuclear study was in 1998 and was normal. Last catheter in 2007 showed a 40%  LAD and 70% circumflex.  Atypical pneumonia: May need pulmonary consult Hypertension:blood pressure on the low side Stage 3 Kidney disease creatinine slightly elevated on admission, creatinine normal Elevated TSH   Attending note: Patient was seen and examined with Ms. Geni Bers, who recorded the database above. She is an 77 year old woman with known CAD status post previous intervention with BMS to the RCA and LAD at [facility] back in 1994 with subsequent evaluation finding nonobstructive disease in the LAD and a 70% circumflex as of 2007. Additional problems include hypertension, hyperlipidemia, GERD, colon cancer status post surgical resection years ago with no reported subsequent bleeding, episode of colitis within the last month, and presently admission to the hospital with what was felt to be a possible atypical pneumonia. She has been experiencing cough and shortness of breath within the last few weeks, also  weakness. Chest x-ray and also chest CT have been abnormal, suggesting the possibility of atypical infection, although she has also had evidence of possible aspiration by swallowing study. She has been seen by Dr. Juanetta Gosling. Reportedly, the ID service at [facility] reviewed the patient's films, and at this point there is not necessarily a high suspicion for MAC, although she was planning to be discharged soon and see Dr. Juanetta Gosling to determine whether any further studies such as bronchoscopy would be necessary.  Yesterday evening after having a nebulizer treatment, she began experiencing chest pressure, which has waxed and waned since that time, never clearly abating. Her symptoms have evolved to associated shoulder discomfort as of today, and when was she was seen by the hospitalist team around midday she continued to report chest pain. A set of cardiac markers were sent at that time with findings of increased troponin to 0.47 (previously normal), and a repeat ECG at that point with chest pain demonstrated evidence of anterolateral injury current which was new compared to her prior tracing.  She has been placed on heparin, already received aspirin, and is being moved to the ICU with nitroglycerin infusion. She has otherwise been hemodynamically stable, and denies any chest pains in the last few weeks. The concern obviously is for an ongoing acute coronary syndrome, perhaps in the LAD distribution, with prior history of BMS to the LAD greater than 10 years ago. She has not been on DAPT, but does take aspirin regularly.  Lab work shows hemoglobin 11.1, platelets 140, potassium 3.7, BUN 9, creatinine 0.8, INR 0.9, BNP 1764. Echocardiogram done yesterday reported normal LVEF with no wall motion abnormalities.  Situation was discussed with the patient and her daughter present at bedside. We discussed transfer to [facility] in anticipation of urgent cardiac catheterization to define the coronary anatomy and assess  revascularization options. It is likely that she has multivessel disease based on her history, and Plavix was not given. She does not report any recent bleeding problems, and if she could have a percutaneous intervention, it does not seem at this point that she has any absolute contraindication to DAPT. Importantly, she may need further evaluation with pulmonary consultation to further investigate her cough and chest imaging abnormalities which actually brought her to the hospital in the first place. Case was discussed with Dr. Gala Romney.

## 2011-02-03 ENCOUNTER — Encounter: Payer: Self-pay | Admitting: Internal Medicine

## 2011-02-03 DIAGNOSIS — I248 Other forms of acute ischemic heart disease: Secondary | ICD-10-CM

## 2011-02-03 LAB — CBC
HCT: 28.7 % — ABNORMAL LOW (ref 36.0–46.0)
MCH: 32.7 pg (ref 26.0–34.0)
MCHC: 34.8 g/dL (ref 30.0–36.0)
MCV: 93.8 fL (ref 78.0–100.0)
Platelets: 152 10*3/uL (ref 150–400)
RDW: 13.2 % (ref 11.5–15.5)
WBC: 7.9 10*3/uL (ref 4.0–10.5)

## 2011-02-03 LAB — BASIC METABOLIC PANEL
BUN: 7 mg/dL (ref 6–23)
Calcium: 8.2 mg/dL — ABNORMAL LOW (ref 8.4–10.5)
Chloride: 107 mEq/L (ref 96–112)
Creatinine, Ser: 0.68 mg/dL (ref 0.50–1.10)
GFR calc Af Amer: >90 mL/min (ref >90)

## 2011-02-03 LAB — CARDIAC PANEL(CRET KIN+CKTOT+MB+TROPI)
CK, MB: 45.9 ng/mL (ref 0.3–4.0)
Total CK: 466 U/L — ABNORMAL HIGH (ref 7–177)

## 2011-02-03 NOTE — Progress Notes (Signed)
Pt is aware of OV on 03/24/11 @ 330pm with RMR

## 2011-02-04 ENCOUNTER — Inpatient Hospital Stay (HOSPITAL_COMMUNITY): Payer: Medicare Other

## 2011-02-05 DIAGNOSIS — I214 Non-ST elevation (NSTEMI) myocardial infarction: Secondary | ICD-10-CM

## 2011-02-05 LAB — BASIC METABOLIC PANEL
BUN: 10 mg/dL (ref 6–23)
CO2: 24 mEq/L (ref 19–32)
Calcium: 8 mg/dL — ABNORMAL LOW (ref 8.4–10.5)
Creatinine, Ser: 0.69 mg/dL (ref 0.50–1.10)
GFR calc non Af Amer: 78 mL/min — ABNORMAL LOW (ref >90)
Glucose, Bld: 94 mg/dL (ref 70–99)
Glucose, Bld: 182 mg/dL — ABNORMAL HIGH (ref 70–99)
Potassium: 3.6 mEq/L (ref 3.5–5.1)
Sodium: 138 mEq/L (ref 135–145)

## 2011-02-05 LAB — CBC
HCT: 29.4 % — ABNORMAL LOW (ref 36.0–46.0)
Hemoglobin: 10.1 g/dL — ABNORMAL LOW (ref 12.0–15.0)
MCH: 32.3 pg (ref 26.0–34.0)
MCV: 93.9 fL (ref 78.0–100.0)
RBC: 3.13 MIL/uL — ABNORMAL LOW (ref 3.87–5.11)

## 2011-02-05 LAB — DIFFERENTIAL
Lymphocytes Relative: 20 % (ref 12–46)
Lymphs Abs: 1.1 10*3/uL (ref 0.7–4.0)
Monocytes Relative: 16 % — ABNORMAL HIGH (ref 3–12)
Neutro Abs: 3.4 10*3/uL (ref 1.7–7.7)
Neutrophils Relative %: 62 % (ref 43–77)

## 2011-02-06 LAB — BASIC METABOLIC PANEL
BUN: 9 mg/dL (ref 6–23)
CO2: 24 mEq/L (ref 19–32)
Calcium: 8 mg/dL — ABNORMAL LOW (ref 8.4–10.5)
Creatinine, Ser: 0.77 mg/dL (ref 0.50–1.10)
Glucose, Bld: 101 mg/dL — ABNORMAL HIGH (ref 70–99)

## 2011-02-06 LAB — CBC
MCH: 32.2 pg (ref 26.0–34.0)
MCV: 94.4 fL (ref 78.0–100.0)
Platelets: 194 10*3/uL (ref 150–400)
RBC: 2.86 MIL/uL — ABNORMAL LOW (ref 3.87–5.11)

## 2011-02-07 ENCOUNTER — Inpatient Hospital Stay
Admission: AD | Admit: 2011-02-07 | Discharge: 2011-03-02 | Disposition: A | Discharge status: Home / Self Care | Payer: Medicare Other | Source: Ambulatory Visit | Attending: Internal Medicine | Admitting: Internal Medicine

## 2011-02-07 ENCOUNTER — Inpatient Hospital Stay: Admission: EM | Admit: 2011-02-07 | Payer: Medicare Other | Source: Ambulatory Visit | Admitting: Internal Medicine

## 2011-02-07 ENCOUNTER — Inpatient Hospital Stay: Admission: EM | Admit: 2011-02-07 | Payer: Self-pay | Source: Ambulatory Visit | Admitting: Internal Medicine

## 2011-02-07 LAB — CBC
HCT: 28.7 % — ABNORMAL LOW (ref 36.0–46.0)
MCH: 32.2 pg (ref 26.0–34.0)
MCHC: 33.4 g/dL (ref 30.0–36.0)
MCV: 96.3 fL (ref 78.0–100.0)
RDW: 13.6 % (ref 11.5–15.5)

## 2011-02-07 LAB — BASIC METABOLIC PANEL
BUN: 7 mg/dL (ref 6–23)
Creatinine, Ser: 0.76 mg/dL (ref 0.50–1.10)
GFR calc Af Amer: 88 mL/min — ABNORMAL LOW (ref >90)
GFR calc non Af Amer: 76 mL/min — ABNORMAL LOW (ref >90)

## 2011-02-09 NOTE — Cardiovascular Report (Signed)
  NAMEJALIAH, FOODY NO.:  [id number]  MEDICAL RECORD NO.:  [id number]  LOCATION:  2912                         FACILITY:  [facility]  PHYSICIAN:  Lorine Bears, MD     DATE OF BIRTH:  01-21-1926  DATE OF PROCEDURE:  02/02/2011 DATE OF DISCHARGE:                           CARDIAC CATHETERIZATION   PROCEDURES PERFORMED:  Angioplasty and bare-metal stent placement to 99% lesion in the mid segment of LAD with a bare metal stent.  INDICATION AND CLINICAL HISTORY:  This is an 77 year old female with known history of coronary artery disease, status post angioplasty and stent placement to the left anterior descending artery as well as the right coronary artery in the past.  She presented with symptoms of dyspnea and was initially diagnosed with pneumonia.  The patient started having chest discomfort and was found to have positive cardiac markers. Her subsequent ECG showed transient ST elevation in the distal anterolateral leads which did not meet criteria for ST-elevation myocardial infarction.  The patient was transferred urgently for cardiac catheterization.  The diagnostic catheterization was performed by Dr. Shirlee Latch.  She was found to have a 99% lesion in the mid left anterior descending artery with a thrombus.  Angioplasty and stent placement was recommended to the left anterior descending artery.  STUDY DETAILS:  IV bivalirudin was initiated with therapeutic ACT.  The patient already received aspirin 325 mg.  We could not give her Plavix while she was on the table due to dysphagia.  That was given at the end of the case.  I used an XB LAD 3.5 guiding catheter.  The lesion was crossed with an intuition wire.  It was pre-dilated with a 2.5 x 15 mm sprinter balloon to 8 atmospheres twice.  I then placed a 2.75 x 26 mm integrity bare-metal stent and deployed it to 12 atmospheres.  After deployment, angiography showed slow flow in the vessel with a TIMI-2 flow, likely  due to distal embolization.  This was treated successfully with intracoronary adenosine with 2 doses.  That established TIMI-3 flow.  I elected not to post dilate the stent to prevent further distal embolization and also the stents appeared to be well opposed.  STUDY CONCLUSIONS:  Successful angioplasty and bare-metal stent placement to the mid left anterior descending artery.  There was an initial 99% stenosis with a large thrombus.  This resulted in 0% residual stenosis with TIMI-3 flow.  RECOMMENDATIONS:  I recommend dual antiplatelet therapy for at least 1 month and if tolerated for 1 year.  Continue aggressive treatment of risk factors.     Lorine Bears, MD    MA/MEDQ  D:  02/02/2011  T:  02/03/2011  Job:  [id number]  Electronically Signed by Lorine Bears MD on 02/09/2011 05:56:17 PM

## 2011-02-12 NOTE — Cardiovascular Report (Signed)
  Kayla Lane, Kayla Lane NO.:  [id number]  MEDICAL RECORD NO.:  [id number]  LOCATION:  2912                         FACILITY:  [facility]  PHYSICIAN:  Marca Ancona, MD      DATE OF BIRTH:  1926-02-19  DATE OF PROCEDURE:  02/02/2011 DATE OF DISCHARGE:                           CARDIAC CATHETERIZATION   PROCEDURE:  Coronary angiography.  INDICATIONS:  Non ST-elevation MI.  The patient with prior percutaneous coronary intervention to the LAD and the RCA.  PROCEDURE NOTE:  After informed consent was obtained, the right groin was sterilely prepped and draped with 1% lidocaine.  She was locally anesthetized in the right groin area.  The right common femoral artery was entered using modified Seldinger technique and a 5-French arterial sheath was placed.  The left coronary artery was engaged using a JL 3.5 catheter and the right coronary artery was engaged using JR-4 catheter. There were no complications.  Left ventriculography was not done as the patient had an echo done today that showed normal EF due to the patient's chronic kidney disease.  FINDINGS:  Hemodynamics: 1. Aorta is 94/52. 2. Left ventriculography was not done as the patient had normal EF on     echo that was done today and has chronic kidney disease. 3. Left main:  The left main had minimal disease. 4. Left circumflex system:  The left circumflex gave rise to a     moderate-sized first obtuse marginal.  First obtuse marginal had     about a 90% proximal stenosis and a 70% midvessel stenosis.  This     is somewhat progressive from the prior heart catheterization.  The     mid to distal RCA had about a 30% stenosis.  There was a small     PLOM. 5. LAD system:  The LAD had a 40-50% proximal vessel stenosis.     Following this, there was a stent in the proximal to mid LAD.  This     had diffuse 40% in-stent restenosis.  Beyond the stent in the mid     LAD, there was an 80% stenosis closely followed by a 99%  stenosis     with thrombus in the mid to distal LAD just before a moderate-sized     diagonal. 6. RCA.  The right coronary artery was a dominant vessel.  There was     about 40% proximal RCA stenosis.  There was a mid RCA stent with an     area of about 40-50% in-stent restenosis.  The PDA had about 50%     midvessel stenosis.  IMPRESSION:  This patient presents with a non-ST elevation myocardial infarction.  She has a 99% mid-to- distal left anterior descending stenosis with thrombus.  Just proximal to this, there is an area of 80% discrete left anterior descending stenosis.  PLAN:  Percutaneous coronary intervention to the LAD.  The patient has been given Plavix.     Marca Ancona, MD     DM/MEDQ  D:  02/02/2011  T:  02/03/2011  Job:  [id number]  Electronically Signed by Marca Ancona MD on 02/12/2011 11:48:39 PM

## 2011-02-15 ENCOUNTER — Encounter: Payer: Self-pay | Admitting: Adult Health

## 2011-02-21 ENCOUNTER — Encounter: Payer: Self-pay | Admitting: Adult Health

## 2011-02-21 ENCOUNTER — Ambulatory Visit (INDEPENDENT_AMBULATORY_CARE_PROVIDER_SITE_OTHER): Payer: Medicare Other | Admitting: Adult Health

## 2011-02-21 DIAGNOSIS — R5383 Other fatigue: Secondary | ICD-10-CM

## 2011-02-21 DIAGNOSIS — R531 Weakness: Secondary | ICD-10-CM

## 2011-02-21 DIAGNOSIS — I2510 Atherosclerotic heart disease of native coronary artery without angina pectoris: Secondary | ICD-10-CM

## 2011-02-21 MED ORDER — SIMVASTATIN 40 MG PO TABS: 40.0000 mg | ORAL_TABLET | Freq: Every day | ORAL | Status: DC

## 2011-02-21 MED ORDER — NITROGLYCERIN 0.4 MG SL SUBL: 0.4000 mg | SUBLINGUAL_TABLET | SUBLINGUAL | Status: AC | PRN

## 2011-02-21 MED ORDER — BISOPROLOL FUMARATE 5 MG PO TABS: 5.0000 mg | ORAL_TABLET | Freq: Every day | ORAL | Status: DC

## 2011-02-21 MED ORDER — LANSOPRAZOLE 15 MG PO CPDR: 15.0000 mg | DELAYED_RELEASE_CAPSULE | Freq: Every day | ORAL | Status: DC

## 2011-02-21 NOTE — Discharge Summary (Signed)
NAMEALPA, SALVO NO.:  [id number]  MEDICAL RECORD NO.:  [id number]  LOCATION:  2004                         FACILITY:  [facility]  PHYSICIAN:  [name] Frieze. Jens Som, MD, FACCDATE OF BIRTH:  Jul 16, 1925  DATE OF ADMISSION:  02/02/2011 DATE OF DISCHARGE:  02/07/2011                              DISCHARGE SUMMARY   PRIMARY CARDIOLOGIST:  Gerrit Friends. Dietrich Pates, MD, [facility]  PRIMARY CARE PROVIDER:  Oneal Deputy. Juanetta Gosling, MD  DISCHARGE DIAGNOSIS:  Non ST-segment elevation myocardial infarction.  SECONDARY DIAGNOSES: 1. Coronary artery disease status post prior proximal left anterior     descending and right coronary artery stenting with bare metal     stenting of the distal left anterior descending this admission. 2. Chronic mycobacterium avium intracellular infection. 3. Right lower lobe 8 mm lung nodule with recommendation for followup     CT in 6 months. 4. Hypertension. 5. Hyperlipidemia. 6. History of colon cancer in 1996. 7. Gastroesophageal reflux disease. 8. Degenerative joint disease. 9. History of spinal stenosis. 10.History of colitis. 11.Status post abdominal hysterectomy. 12.Status post appendectomy. 13.Status post partial colectomy secondary to colon cancer. 14.Status post tonsillectomy. 15.Status post back surgery.  ALLERGIES:  PENICILLIN, SULFA, TETRACYCLINE.  PROCEDURES: 1. Left heart cardiac catheterizations performed February 02, 2011     revealing 40% in-stent restenosis in the proximal LAD with new 80     followed by 99% stenosis in the distal LAD with thrombus burden.     There is a 90% proximal stenosis in a moderate-sized first obtuse     marginal.  Left circumflex had 30% distal stenosis.  The right     coronary artery had a 40% proximal stenosis and 40-50% stenosis     within a previously placed stent.  The PDA had 50% stenosis. 2. Successful bare metal stenting of the distal LAD with placement of     a 2.75 x 26 mm Integrity RX bare metal  stent.  HISTORY OF PRESENT ILLNESS:  An 77 year old female with prior history of coronary artery disease who was recently admitted to [facility] on January 30, 2011 with diagnosis of atypical pneumonia versus bronchitis.  She was placed on Biaxin therapy.  On October 23 following a nebulizer treatment, the patient developed chest pain and subsequently underwent cardiac enzymatic evaluation revealing an elevation of troponin to 0.47.  Her chest pain had resolved and she was seen by Cardiology on the morning of October 24 with recommendation to transfer to [facility] for further evaluation.  HOSPITAL COURSE:  The patient eventually peaked her CK of 466, MB of 45.9, troponin I 10.95.  She had no further chest discomfort and underwent diagnostic catheterizations on October 24 revealing mild in- stent restenosis in the proximal LAD and mid right coronary artery with new 80 and followed by 99% tandem stenoses in the distal LAD with thrombus.  This was felt to be the infarct vessel and decision was made to pursue PCI of the distal LAD.  Films were reviewed and she underwent successful PCI and bare metal stenting of the distal LAD with placement of 2.75 x 26 mm Integrity RX bare metal stent.  The patient tolerated the procedure  well and postprocedure.  Had no recurrence of chest discomfort.  She was maintained on aspirin, Plavix, beta-blocker, and statin therapy.  We have discontinued her ARB and hydrochlorothiazide therapy secondary to borderline blood pressures and dehydration.  On October 26, the patient complained of cough and antibiotics were resumed.  She did have a low-grade fever, however, this has subsequently resolved.  Ms. Leyh was evaluated by PT and OT given generalized deconditioning. They felt that the patient would benefit from skilled nursing facility placement and both the patient and family were agreeable.  At this point, social work is working with local  facilities to obtain placement. We expect the patient to be discharged to skilled nursing facility within the next 24 hours.  DISCHARGE LABS:  Hemoglobin.  9.6, hematocrit 28.6, WBC 5.6, platelets 222,000.  Sodium 139, potassium 4.4, chloride 108, CO2 26, BUN 7, creatinine 0.76, glucose 96, albumin is 2.7, calcium 8.5, phosphorus 2.7, magnesium 2.4.  CK 392, MB 29.5, troponin I 8.09.  TSH 5.756, free T4 1.21, T3 90.9, prealbumin 13.9.  Urinalysis was negative.  MRSA screen was negative.  DISPOSITION:  The patient will be discharged to skilled nursing facility today in good condition.  FOLLOWUP PLANS AND APPOINTMENTS:  We have arranged for followup with Lorin Picket, nurse practitioner at [facility] office on November 12 at 1:40 p.m.  She will follow up with Dr. Juanetta Gosling as previously scheduled regarding pneumonia and need for followup CT in approximately 6 months secondary to nodule.  DISCHARGE MEDICATIONS: 1. Bisoprolol 5 mg daily. 2. Plavix 75 mg daily. 3. Nitroglycerin 0.4 mg sublingual for chest pain. 4. Aspirin 81 mg at bedtime. 5. Benzonanate 100 mg t.i.d. 6. Calcium carbonate plus D 500/200 mg daily.7. Clarithromycin 500 mg b.i.d. 8. Vitamin B12 100 mcg half a tablet daily. 9. Hydrocodone/APAP 5/325 mg 1 tab q.6 hours p.r.n. 10.Prevacid 15 mg daily. 11.Simvastatin 40 mg at bedtime.  OUTSTANDING LABS AND STUDIES:  Follow up chest CT in 6 months as outlined above.  DURATION OF DISCHARGE ENCOUNTER:  45 minutes including physician time.     Nicolasa Ducking, ANP   ______________________________ [name] Frieze. Jens Som, MD, [facility]    CB/MEDQ  D:  02/07/2011  T:  02/07/2011  Job:  [id number]  cc:   Ramon Dredge L. Juanetta Gosling, M.D.  Electronically Signed by Nicolasa Ducking ANP on 02/12/2011 02:00:56 PM Electronically Signed by Olga Millers MD [facility] on 02/21/2011 04:18:20 PM

## 2011-02-21 NOTE — Patient Instructions (Signed)
Your physician recommends that you continue on your current medications as directed. Please refer to the Current Medication list given to you today.  Your physician recommends that you schedule a follow-up appointment in: 3 months  

## 2011-02-23 ENCOUNTER — Encounter: Payer: Self-pay | Admitting: Adult Health

## 2011-02-23 NOTE — Progress Notes (Signed)
HPI:Kayla Lane is a very pleasant 77 y/o patient of Dr. Dietrich Pates we are following for ongoing assessment and treatment of CAD, with recent admission to [facility] on 02/02/2011 for NSTEMI. Cardiac cath demonstrated 40% instent stenosis of the prox LAD with new 80% followed by 99% stenosis of the distal LAD with thrombus burden.There was a 90% proximal stenosis of a moderate sized first obtuse marginal. She had BMS placed to the distal LAD.  Medications were changed to include addition of Plavix, discontinuation of HCTZ and losartan. She was also treated for bronchitis during admission with antibiotics. She was found to be deconditioned and was transferred to OP rehab facility for strengthening on discharge. She has one more week of this at the time of this office visit.  Since discharge she has done very well without recurrence of symptoms. She is completing rehab with good progress. No complaints of exertional chest pain, shortness of breath, or bleeding issues.  Allergies  Allergen Reactions  . Penicillins     REACTION: Unknown reaction  . Sulfonamide Derivatives     REACTION: Unknown reaction  . Tetracycline     REACTION: Unknown reaction    Current Outpatient Prescriptions  Medication Sig Dispense Refill  . bisoprolol (ZEBETA) 5 MG tablet Take 1 tablet (5 mg total) by mouth daily.  90 tablet  1  . lansoprazole (PREVACID 24HR) 15 MG capsule Take 1 capsule (15 mg total) by mouth daily.  90 capsule  1  . nitroGLYCERIN (NITROSTAT) 0.4 MG SL tablet Place 1 tablet (0.4 mg total) under the tongue every 5 (five) minutes as needed.  25 tablet  3  . simvastatin (ZOCOR) 40 MG tablet Take 1 tablet (40 mg total) by mouth at bedtime.  90 tablet  1    Past Medical History  Diagnosis Date  . History of colon cancer      1996  . Coronary artery disease   . Myocardial infarction   . GERD (gastroesophageal reflux disease)   . DJD (degenerative joint disease)   . HTN (hypertension)   . Hyperlipidemia     . Spinal stenosis   . Colitis     Past Surgical History  Procedure Date  . Abdominal hysterectomy   . Appendectomy   . Partial colectomy 1996    colon cancer  . Cardiac catheterization   . Back surgery   . Tonsillectomy   . Colonoscopy 01/2009    Dr. Bernita Buffy anastomosis at 18cm. Residual rectal and colonic mucosa appeared normal  . Esophagogastroduodenoscopy 01/2009    Dr. Carron Curie cervical esophageal web s/p dilation, small hh  . Esophagogastroduodenoscopy 11/2009    Dr. Ronni Rumble hh  . Colonoscopy 11/2009    Dr. Marlowe Alt papilla, surgical anastomosis at 18cm    JYN:WGNFAO of systems complete and found to be negative unless listed above PHYSICAL EXAM BP 131/71  Pulse 80  Ht 5\' 2"  (1.575 m)  Wt 103 lb (46.72 kg)  BMI 18.84 kg/m2  General: Well developed, well nourished, in no acute distress Head: Eyes PERRLA, No xanthomas.   Normal cephalic and atramatic  Lungs: Clear bilaterally to auscultation and percussion. Heart: HRRR S1 S2, soft 1/6 systolic murmur.  Pulses are 2+ & equal.            No carotid bruit. No JVD.  No abdominal bruits. No femoral bruits. Abdomen: Bowel sounds are positive, abdomen soft and non-tender without masses or  Hernia's noted. Msk:  Back normal, normal gait. Diminished l strength and tone for age.Sitting in wheelchair. Extremities: No clubbing, cyanosis or edema.  DP +1 Neuro: Alert and oriented X 3. Psych:  Good affect, responds appropriately   ASSESSMENT AND PLAN

## 2011-02-23 NOTE — Assessment & Plan Note (Signed)
Doing well post admission without symptoms. She remains on Plavix with BMS.  She will continue this for at a minimum of one month.  Follow-up with Dr. Dietrich Pates on next visit to discuss discontinuation of Plavix. She is tolerating medication changes well.

## 2011-02-23 NOTE — Assessment & Plan Note (Signed)
Making good progress in OP rehab facility with one more week to go before discharge. She is anxious to return home.

## 2011-02-28 NOTE — Progress Notes (Signed)
Addended by: Jennings Books on: 02/28/2011 03:28 PM   Modules accepted: Level of Service

## 2011-03-07 ENCOUNTER — Encounter: Payer: Self-pay | Admitting: Internal Medicine

## 2011-03-15 ENCOUNTER — Ambulatory Visit (INDEPENDENT_AMBULATORY_CARE_PROVIDER_SITE_OTHER): Payer: Medicare Other | Admitting: Internal Medicine

## 2011-03-15 ENCOUNTER — Encounter: Payer: Self-pay | Admitting: Internal Medicine

## 2011-03-15 ENCOUNTER — Ambulatory Visit (HOSPITAL_COMMUNITY)
Admission: RE | Admit: 2011-03-15 | Discharge: 2011-03-15 | Disposition: A | Discharge status: Home / Self Care | Payer: Medicare Other | Source: Ambulatory Visit | Attending: Pulmonary Disease | Admitting: Pulmonary Disease

## 2011-03-15 DIAGNOSIS — R634 Abnormal weight loss: Secondary | ICD-10-CM

## 2011-03-15 DIAGNOSIS — R0602 Shortness of breath: Secondary | ICD-10-CM | POA: Insufficient documentation

## 2011-03-15 MED ORDER — ALBUTEROL SULFATE (5 MG/ML) 0.5% IN NEBU
2.5000 mg | INHALATION_SOLUTION | Freq: Once | RESPIRATORY_TRACT | Status: AC
  Administered 2011-03-15: 2.5 mg via RESPIRATORY_TRACT

## 2011-03-15 NOTE — Progress Notes (Signed)
Primary Care Physician:  Colette Ribas, MD Primary Gastroenterologist:  Dr.   Pre-Procedure History & Physical: HPI:  Kayla Lane is a 77 y.o. female here for followup of GERD, weight loss and constipation. Recently treated for atypical pneumonia and developed what sounds like to coronary syndrome/acute myocardial infarction and underwent emergency catheterization and another stent was placed.  She reports doing extremely well the space. In fact, she's gained 11 pounds since her last office visit. Reflux symptoms well controlled on Prevacid. Takes MiraLax occasionally has a bowel movement daily to every other day. No blood per rectum. Last EGD colonoscopy 2011 as chronicled in the medical record  Past Medical History  Diagnosis Date  . History of colon cancer      1996  . Coronary artery disease   . Myocardial infarction   . GERD (gastroesophageal reflux disease)   . DJD (degenerative joint disease)   . HTN (hypertension)   . Hyperlipidemia   . Spinal stenosis   . Colitis   . Hiatal hernia     Past Surgical History  Procedure Date  . Abdominal hysterectomy   . Appendectomy   . Partial colectomy 1996    colon cancer  . Cardiac catheterization   . Back surgery   . Tonsillectomy   . Colonoscopy 01/2009    Dr. Bernita Buffy anastomosis at 18cm. Residual rectal and colonic mucosa appeared normal  . Esophagogastroduodenoscopy 01/2009    Dr. Carron Curie cervical esophageal web s/p dilation, small hh  . Esophagogastroduodenoscopy 11/2009    Dr. Ronni Rumble h[facility]  . Colonoscopy 11/2009    Dr. Marlowe Alt papilla, surgical anastomosis at 18cm    Prior to Admission medications   Medication Sig Start Date End Date Taking? Authorizing Provider  aspirin EC 81 MG tablet Take 81 mg by mouth at bedtime.     Yes Historical Provider, MD  benzonatate (TESSALON) 100 MG capsule Take 100 mg by mouth 3 (three) times daily as needed.     Yes Historical Provider, MD  bisoprolol  (ZEBETA) 5 MG tablet Take 1 tablet (5 mg total) by mouth daily. 02/21/11  Yes Joni Reining, NP  calcium-vitamin D (OSCAL WITH D) 500-200 MG-UNIT per tablet Take 1 tablet by mouth daily.     Yes Historical Provider, MD  clarithromycin (BIAXIN) 500 MG tablet Take 500 mg by mouth 2 (two) times daily.     Yes Historical Provider, MD  clopidogrel (PLAVIX) 75 MG tablet Take 75 mg by mouth daily.  03/02/11  Yes Historical Provider, MD  furosemide (LASIX) 20 MG tablet Take 20 mg by mouth daily.  03/14/11  Yes Historical Provider, MD  HYDROcodone-acetaminophen (NORCO) 5-325 MG per tablet Take 1 tablet by mouth every 6 (six) hours as needed. For pain    Yes Historical Provider, MD  lansoprazole (PREVACID 24HR) 15 MG capsule Take 1 capsule (15 mg total) by mouth daily. 02/21/11  Yes Joni Reining, NP  nitroGLYCERIN (NITROSTAT) 0.4 MG SL tablet Place 1 tablet (0.4 mg total) under the tongue every 5 (five) minutes as needed. 02/21/11  Yes Joni Reining, NP  simvastatin (ZOCOR) 40 MG tablet Take 1 tablet (40 mg total) by mouth at bedtime. 02/21/11  Yes Joni Reining, NP  vitamin B-12 (CYANOCOBALAMIN) 100 MCG tablet Take 50 mcg by mouth daily.     Yes Historical Provider, MD    Allergies as of 03/15/2011 - Review Complete 03/15/2011  Allergen Reaction Noted  . Penicillins    . Sulfonamide derivatives    . Tetracycline  Family History  Problem Relation Age of Onset  . Diabetes type II Other   . Kidney cancer Brother   . Hypertension Mother   . Diabetes Sister     History   Social History  . Marital Status: Widowed    Spouse Name: N/A    Number of Children: N/A  . Years of Education: N/A   Occupational History  . Not on file.   Social History Main Topics  . Smoking status: Never Smoker   . Smokeless tobacco: Never Used  . Alcohol Use: No  . Drug Use: No  . Sexually Active: No   Other Topics Concern  . Not on file   Social History Narrative  . No narrative on file     Review of Systems: See HPI, otherwise negative ROS  Physical Exam: BP 103/58  Pulse 82  Temp(Src) 97 F (36.1 C) (Temporal)  Ht 5\' 2"  (1.575 m)  Wt 111 lb 3.2 oz (50.44 kg)  BMI 20.34 kg/m2 General:   Frail, elderly Alert,  thin lady pleasant and cooperative in NAD Skin:  Intact without significant lesions or rashes. Eyes:  Sclera clear, no icterus.   Conjunctiva pink. Ears:  Normal auditory acuity. Nose:  No deformity, discharge,  or lesions. Mouth:  No deformity or lesions. Neck:  Supple; no masses or thyromegaly. No significant cervical adenopathy. Lungs:  Clear throughout to auscultation.   No wheezes, crackles, or rhonchi. No acute distress. Heart:  Regular rate and rhythm; no murmurs, clicks, rubs,  or gallops. Abdomen:   Nondistended positive bowel sounds soft nontender without appreciable mass or organomegaly Pulses:  Normal pulses noted. Extremities:  Without clubbing or edema.  Impression/Plan:

## 2011-03-15 NOTE — Assessment & Plan Note (Signed)
   Ms. Bisaillon appears to be rebounding nicely from her recent acute illnesses. Weight gain is somewhat reassuring. GI symptoms under good control. She is really not having any reflux or dysphagia. Constipation is being managed satisfactorily.  Recommendations: Continue Prevacid 30 mg orally daily indefinitely - I feel  the benefits outweigh the risks.  Continue to utilize MiraLax 17 g orally daily when necessary constipation  I doubt this lady needs to have another surveillance colonoscopy. The timing would place her at age 25 when her next exam is due.    For now, we'll plan to see her back in the office in one year

## 2011-03-15 NOTE — Patient Instructions (Signed)
Continue Prevacid 30 mg daily  Continue MiraLax 17 g orally daily as needed for constipation.  Return here for followup appointment one year

## 2011-03-15 NOTE — Procedures (Signed)
NAMEKENYADA, Kayla Lane               ACCOUNT NO.:  [id number]  MEDICAL RECORD NO.:  [id number]  LOCATION:  RESP                          FACILITY:  [facility]  PHYSICIAN:  Dalilah Curlin L. Juanetta Gosling, M.D.DATE OF BIRTH:  [birth year]  DATE OF PROCEDURE: DATE OF DISCHARGE:                           PULMONARY FUNCTION TEST   1. Spirometry shows a mild-to-moderate ventilatory defect with     evidence of airflow obstruction at the level of the smaller     airways. 2. Lung volumes are normal. 3. DLCO is mildly reduced. 4. There is no significant bronchodilator improvement.     Doyne Micke L. Juanetta Gosling, M.D.     ELH/MEDQ  D:  03/15/2011  T:  03/15/2011  Job:  [id number]  cc:   Dr. Phillips Odor

## 2011-03-22 ENCOUNTER — Encounter (HOSPITAL_COMMUNITY): Payer: Self-pay

## 2011-03-22 ENCOUNTER — Encounter (HOSPITAL_COMMUNITY)
Admission: RE | Admit: 2011-03-22 | Discharge: 2011-03-22 | Disposition: A | Discharge status: Home / Self Care | Payer: Medicare Other | Source: Ambulatory Visit | Attending: Cardiology | Admitting: Cardiology

## 2011-03-22 DIAGNOSIS — Z5189 Encounter for other specified aftercare: Secondary | ICD-10-CM | POA: Insufficient documentation

## 2011-03-22 DIAGNOSIS — I252 Old myocardial infarction: Secondary | ICD-10-CM | POA: Insufficient documentation

## 2011-03-22 DIAGNOSIS — I2510 Atherosclerotic heart disease of native coronary artery without angina pectoris: Secondary | ICD-10-CM | POA: Insufficient documentation

## 2011-03-22 NOTE — Patient Instructions (Signed)
Pt has finished orientation and is scheduled to start CR on 03/28/11 at 9:30 am. Pt has been instructed to arrive to class 15 minutes early for scheduled class. Pt has been instructed to wear comfortable clothing and shoes with rubber soles. Pt has been told to take their medications 1 hour prior to coming to class.  If the patient is not going to attend class, he/she has been instructed to call.

## 2011-03-22 NOTE — Progress Notes (Signed)
During orientation advised patient on arrival and appointment times what to wear, what to do before, during and after exercise. Reviewed attendance and class policy. Talked about inclement weather and class consultation policy. Pt is scheduled to start Cardiac Rehab on 03/28/11 at 9:30 am. Pt was advised to come to class 5 minutes before class starts. He was also given instructions on meeting with the dietician and attending the Family Structure classes. Pt is eager to get started.

## 2011-03-24 ENCOUNTER — Ambulatory Visit: Payer: Medicare Other | Admitting: Internal Medicine

## 2011-03-28 ENCOUNTER — Encounter (HOSPITAL_COMMUNITY)
Admission: RE | Admit: 2011-03-28 | Discharge: 2011-03-28 | Disposition: A | Discharge status: Home / Self Care | Payer: Medicare Other | Source: Ambulatory Visit | Attending: Cardiology | Admitting: Cardiology

## 2011-03-30 ENCOUNTER — Encounter (HOSPITAL_COMMUNITY)
Admission: RE | Admit: 2011-03-30 | Discharge: 2011-03-30 | Disposition: A | Discharge status: Home / Self Care | Payer: Medicare Other | Source: Ambulatory Visit | Attending: Cardiology | Admitting: Cardiology

## 2011-04-01 ENCOUNTER — Encounter (HOSPITAL_COMMUNITY)
Admission: RE | Admit: 2011-04-01 | Discharge: 2011-04-01 | Disposition: A | Discharge status: Home / Self Care | Payer: Medicare Other | Source: Ambulatory Visit | Attending: Cardiology | Admitting: Cardiology

## 2011-04-04 ENCOUNTER — Encounter (HOSPITAL_COMMUNITY): Payer: Medicare Other

## 2011-04-06 ENCOUNTER — Encounter (HOSPITAL_COMMUNITY): Payer: Medicare Other

## 2011-04-08 ENCOUNTER — Encounter (HOSPITAL_COMMUNITY): Payer: Medicare Other

## 2011-04-11 ENCOUNTER — Encounter (HOSPITAL_COMMUNITY): Payer: Medicare Other

## 2011-04-13 ENCOUNTER — Encounter (HOSPITAL_COMMUNITY)
Admission: RE | Admit: 2011-04-13 | Discharge: 2011-04-13 | Disposition: A | Discharge status: Home / Self Care | Payer: Medicare Other | Source: Ambulatory Visit | Attending: Cardiology | Admitting: Cardiology

## 2011-04-13 DIAGNOSIS — Z5189 Encounter for other specified aftercare: Secondary | ICD-10-CM | POA: Insufficient documentation

## 2011-04-13 DIAGNOSIS — I252 Old myocardial infarction: Secondary | ICD-10-CM | POA: Insufficient documentation

## 2011-04-13 DIAGNOSIS — I2510 Atherosclerotic heart disease of native coronary artery without angina pectoris: Secondary | ICD-10-CM | POA: Insufficient documentation

## 2011-04-15 ENCOUNTER — Encounter (HOSPITAL_COMMUNITY): Payer: Medicare Other

## 2011-04-18 ENCOUNTER — Encounter (HOSPITAL_COMMUNITY)
Admission: RE | Admit: 2011-04-18 | Discharge: 2011-04-18 | Disposition: A | Discharge status: Home / Self Care | Payer: Medicare Other | Source: Ambulatory Visit | Attending: Cardiology | Admitting: Cardiology

## 2011-04-18 ENCOUNTER — Ambulatory Visit: Payer: Medicare Other | Admitting: Adult Health

## 2011-04-20 ENCOUNTER — Encounter (HOSPITAL_COMMUNITY)
Admission: RE | Admit: 2011-04-20 | Discharge: 2011-04-20 | Disposition: A | Discharge status: Home / Self Care | Payer: Medicare Other | Source: Ambulatory Visit | Attending: Cardiology | Admitting: Cardiology

## 2011-04-22 ENCOUNTER — Encounter (HOSPITAL_COMMUNITY): Payer: Medicare Other

## 2011-04-25 ENCOUNTER — Encounter (HOSPITAL_COMMUNITY)
Admission: RE | Admit: 2011-04-25 | Discharge: 2011-04-25 | Disposition: A | Discharge status: Home / Self Care | Payer: Medicare Other | Source: Ambulatory Visit | Attending: Cardiology | Admitting: Cardiology

## 2011-04-27 ENCOUNTER — Encounter (HOSPITAL_COMMUNITY)
Admission: RE | Admit: 2011-04-27 | Discharge: 2011-04-27 | Disposition: A | Discharge status: Home / Self Care | Payer: Medicare Other | Source: Ambulatory Visit | Attending: Cardiology | Admitting: Cardiology

## 2011-04-27 IMAGING — CT CT PELVIS W/O CM
2 of 4 series · 17 of 46 positions shown, 19 images · non-contrast
Comparison: CT abdomen pelvis of 12/01/2005

CT ABDOMEN

CLINICAL DATA: Upper abdominal pain for 3 weeks, history of colon
carcinoma and prior partial colectomy, elevated renal creatinine
value

CT ABDOMEN AND PELVIS WITHOUT CONTRAST
TECHNIQUE: Multidetector CT imaging of the abdomen and pelvis was
performed following the standard protocol without intravenous
contrast.

[Series 2: abd|pel w/o 5.0 b40f · axial · non-contrast · 0.66mm/px · z∈[-431,-46]mm · 14 of 85 slices shown, 16 images]
[im 4/85  soft-tissue]
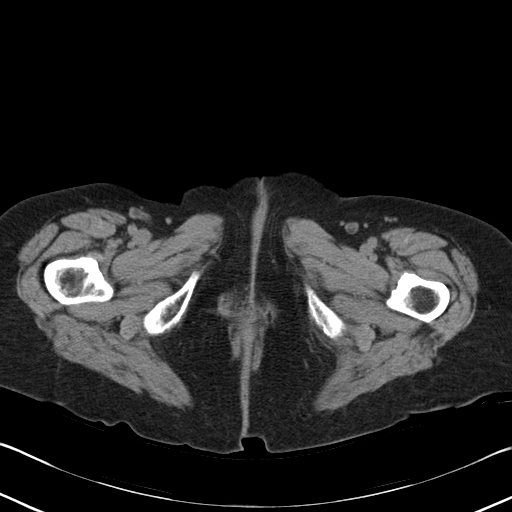
[im 4/85  bone]
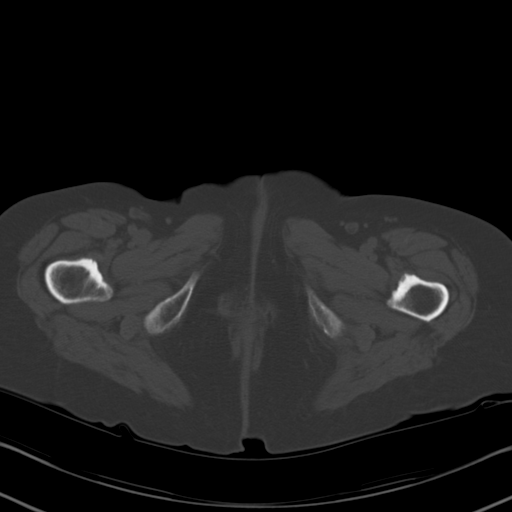
[im 11/85  soft-tissue]
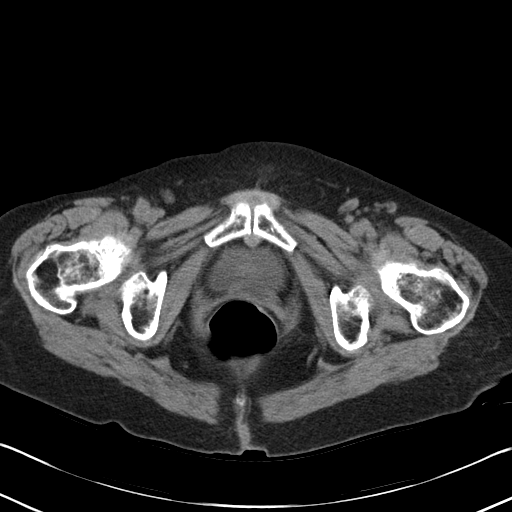
[im 18/85  soft-tissue]
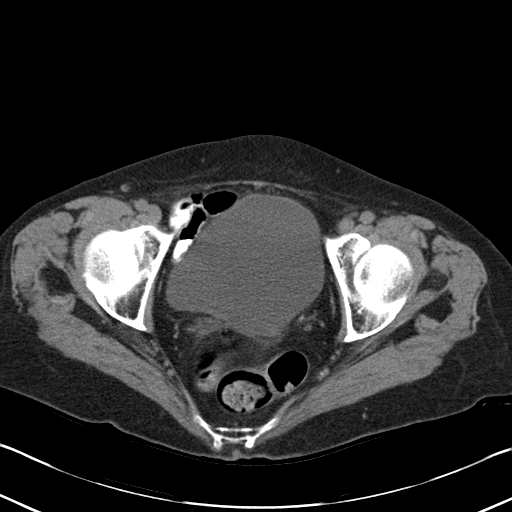
[im 22/85  soft-tissue]
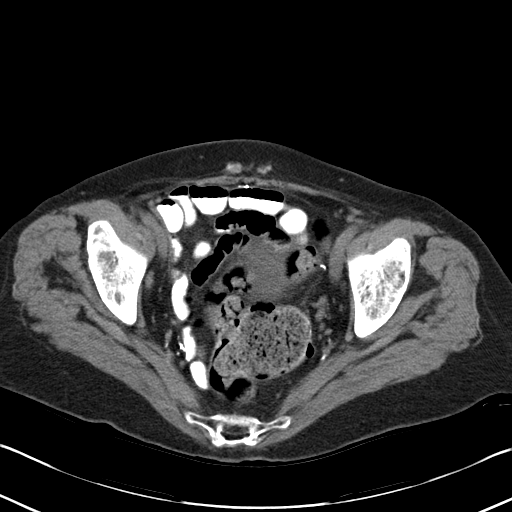
[im 29/85  soft-tissue]
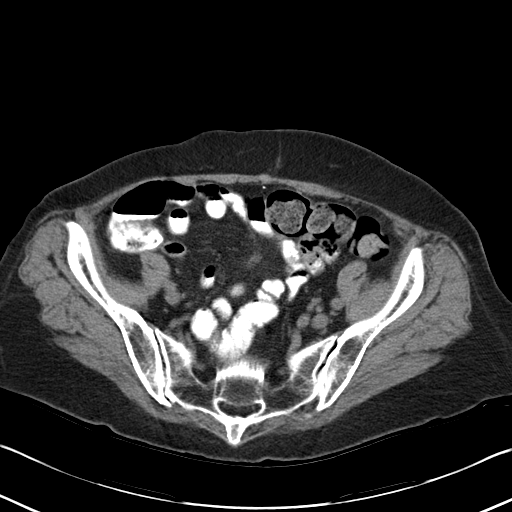
[im 36/85  soft-tissue]
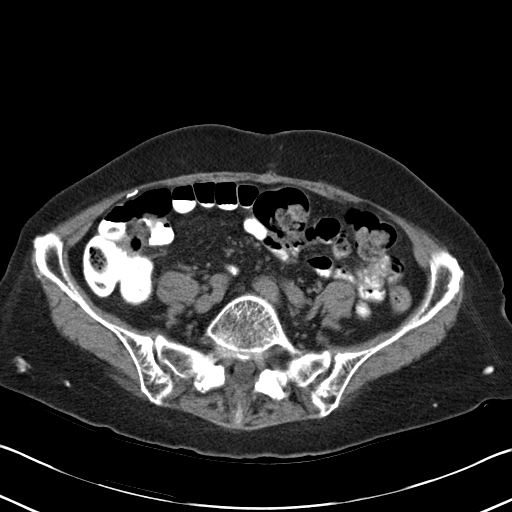
[im 39/85  soft-tissue]
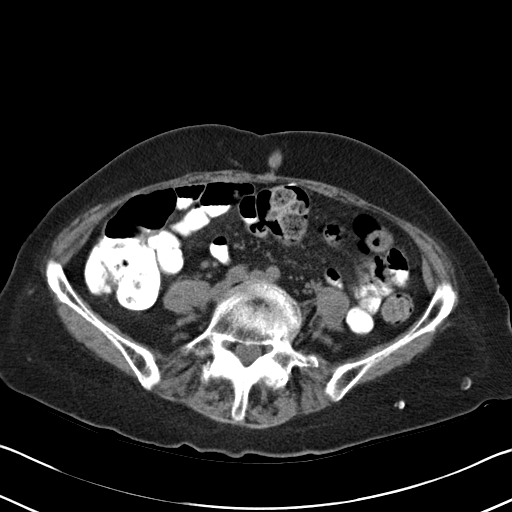
[im 46/85  soft-tissue]
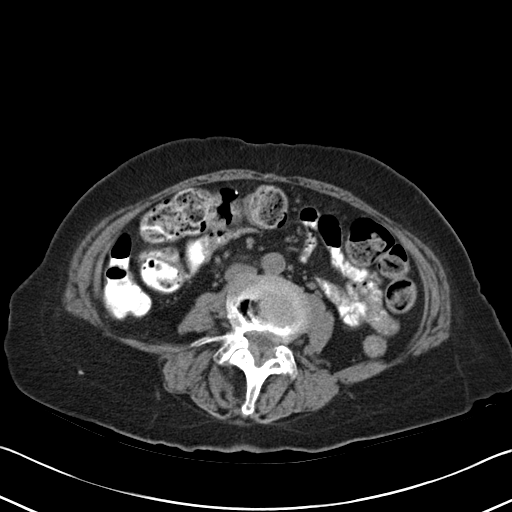
[im 50/85  soft-tissue]
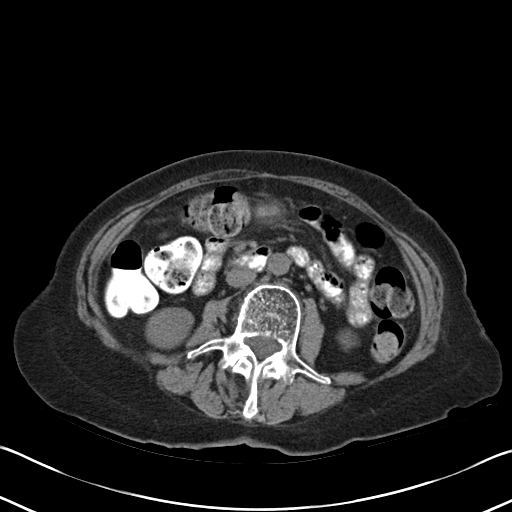
[im 50/85  bone]
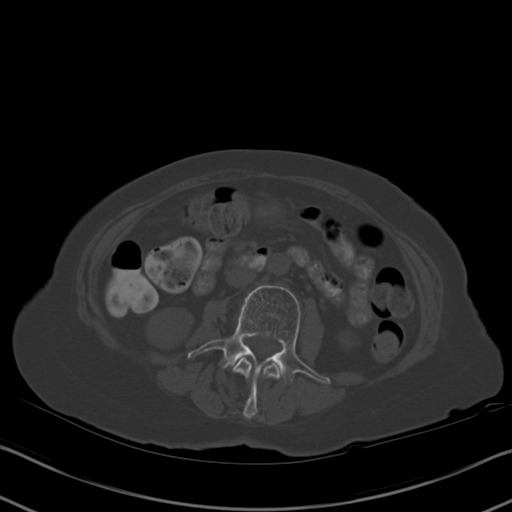
[im 57/85  soft-tissue]
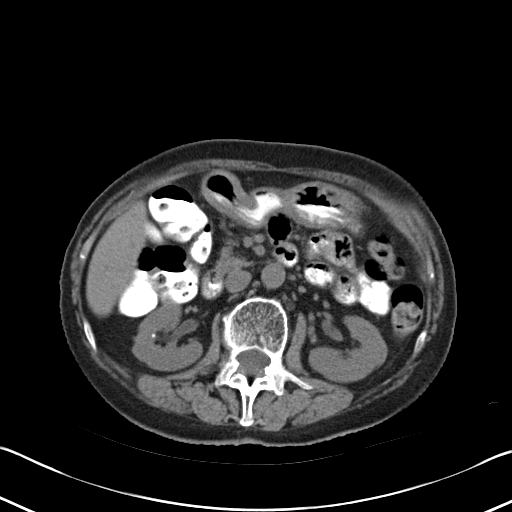
[im 64/85  soft-tissue]
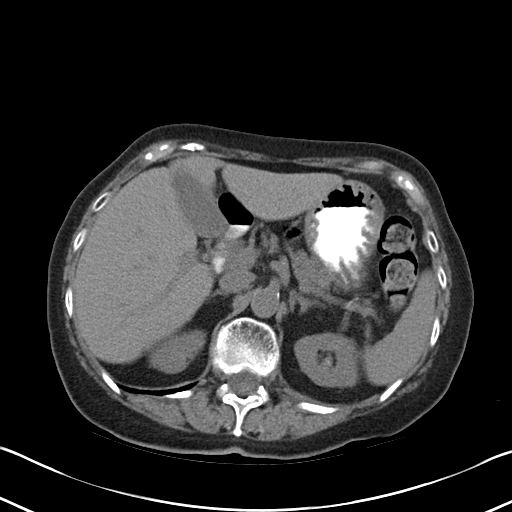
[im 67/85  soft-tissue]
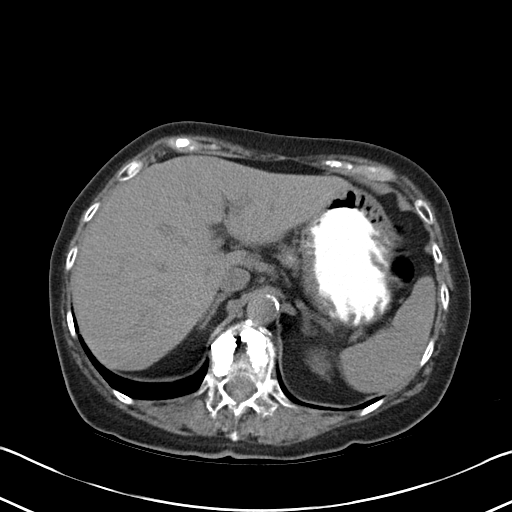
[im 74/85  soft-tissue]
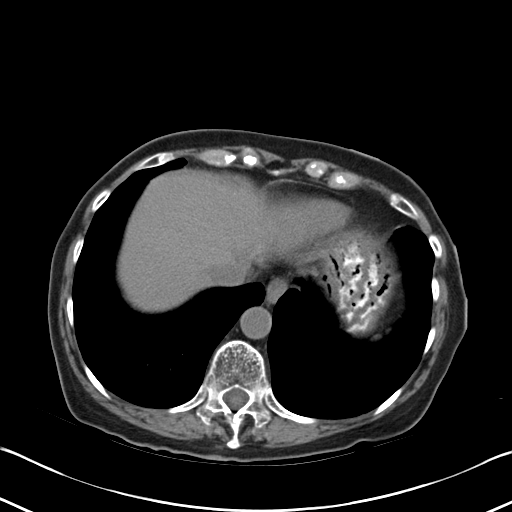
[im 81/85  soft-tissue]
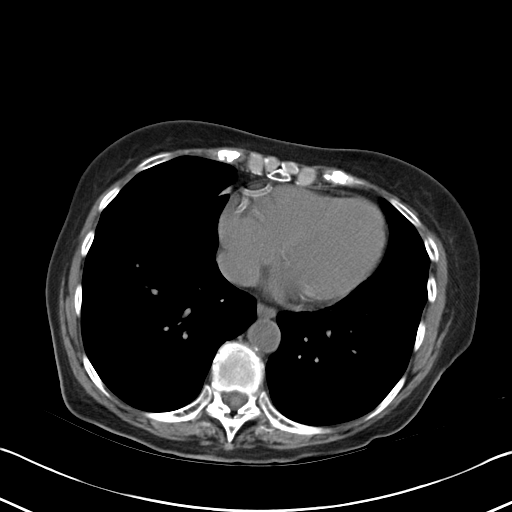

[Series 4: mpr cor (id) · coronal · 0.83mm/px · 3 of 68 slices shown]
[im 23/68  soft-tissue]
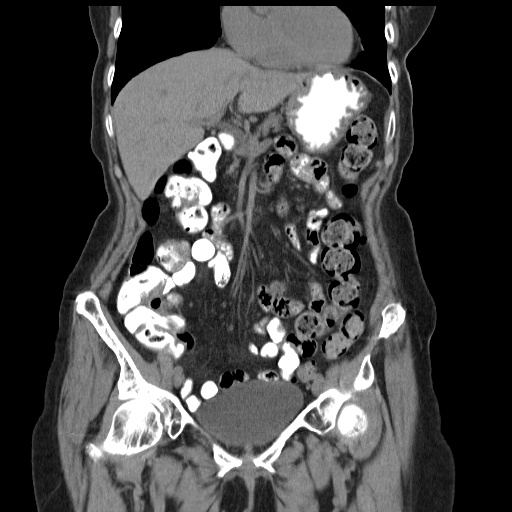
[im 30/68  soft-tissue]
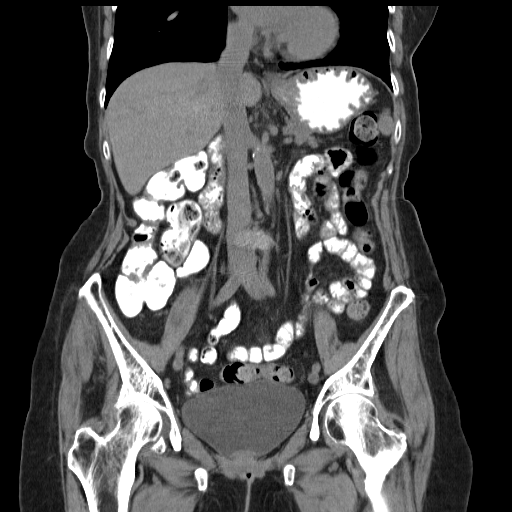
[im 38/68  soft-tissue]
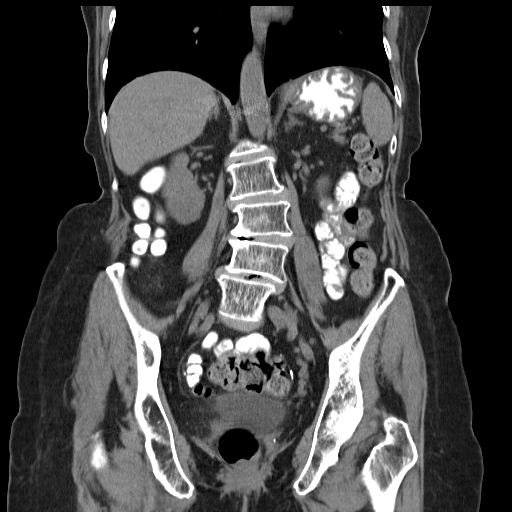

[17 of 46 positions shown; findings below may reference images not displayed]

FINDINGS: The lung bases are clear.  There is mild cardiomegaly
present.  No focal hepatic lesion is seen on this unenhanced study.
No calcified gallstones are noted.  The pancreas is relatively
atrophic.  The pancreatic duct is not dilated.  The adrenal glands
and spleen appear normal.  There are small nonobstructing renal
calculi present bilaterally.  The abdominal aorta is normal in
caliber.  No adenopathy is seen.  Lumbar scoliosis is noted, and
vertebroplasty is present at T12 level.
IMPRESSION: 1.  Small nonobstructing renal calculi.  No acute abnormality.
2.  Mild cardiomegaly.
3.  Lumbar scoliosis

CT PELVIS
FINDINGS: The distal ureters are normal in caliber.  No distal
ureteral calculi are seen.  The urinary bladder is unremarkable.
Uterus has previously been removed.  No pelvic mass, fluid, or
adenopathy is seen.  A moderate amount of feces is noted throughout
the colon.  The terminal ileum appears normal.
IMPRESSION: 1.  No distal ureteral calculi are seen.
2. No pelvic mass, fluid, or adenopathy is noted.

## 2011-04-27 NOTE — Progress Notes (Signed)
Cardiac Rehabilitation Program Progress Report   Orientation:  03/22/2011 Graduate Date:  tbd Discharge Date:  tbd # of sessions completed: 3  Cardiologist: Rothbart,Robert Family MD:  Assunta Found Class Time:  09:30  A.  Exercise Program:  Tolerates exercise @ 1.1 METS for 15 minutes  B.  Mental Health:  Good mental attitude  C.  Education/Instruction/Skills  Knows THR for exercise and Uses Perceived Exertion Scale and/or Dyspnea Scale  Uses Perceived Exertion Scale and/or Dyspnea Scale  D.  Nutrition/Weight Control/Body Composition:  Adherence to prescribed nutrition program: good   *This section completed by Mickle Plumb, Andres Shad, RD, LDN, CDE  E.  Blood Lipids    No results found for this basename: CHOL     No results found for this basename: TRIG     No results found for this basename: HDL     No results found for this basename: CHOLHDL     No results found for this basename: LDLDIRECT      F.  Lifestyle Changes:  Making positive lifestyle changes  G.  Symptoms noted with exercise:  Asymptomatic  Report Completed By:  Angelica Pou   Comments: This is patients 1st week report. She has achieved a peak mets of 1.1. Her resting HR is 72 and resting BP is 110/58. Her peak HR is 86, her peak BP is 122/60. She is very motivated to exercise.

## 2011-04-29 ENCOUNTER — Encounter (HOSPITAL_COMMUNITY): Payer: Medicare Other

## 2011-05-02 ENCOUNTER — Encounter (HOSPITAL_COMMUNITY): Payer: Medicare Other

## 2011-05-04 ENCOUNTER — Encounter (HOSPITAL_COMMUNITY)
Admission: RE | Admit: 2011-05-04 | Discharge: 2011-05-04 | Disposition: A | Discharge status: Home / Self Care | Payer: Medicare Other | Source: Ambulatory Visit | Attending: Cardiology | Admitting: Cardiology

## 2011-05-06 ENCOUNTER — Encounter (HOSPITAL_COMMUNITY): Payer: Medicare Other

## 2011-05-09 ENCOUNTER — Encounter (HOSPITAL_COMMUNITY)
Admission: RE | Admit: 2011-05-09 | Discharge: 2011-05-09 | Disposition: A | Discharge status: Home / Self Care | Payer: Medicare Other | Source: Ambulatory Visit | Attending: Cardiology | Admitting: Cardiology

## 2011-05-11 ENCOUNTER — Encounter (HOSPITAL_COMMUNITY)
Admission: RE | Admit: 2011-05-11 | Discharge: 2011-05-11 | Disposition: A | Discharge status: Home / Self Care | Payer: Medicare Other | Source: Ambulatory Visit | Attending: Cardiology | Admitting: Cardiology

## 2011-05-13 ENCOUNTER — Encounter (HOSPITAL_COMMUNITY)
Admission: RE | Admit: 2011-05-13 | Discharge: 2011-05-13 | Disposition: A | Discharge status: Home / Self Care | Payer: Medicare Other | Source: Ambulatory Visit | Attending: Cardiology | Admitting: Cardiology

## 2011-05-13 DIAGNOSIS — Z5189 Encounter for other specified aftercare: Secondary | ICD-10-CM | POA: Insufficient documentation

## 2011-05-13 DIAGNOSIS — I2510 Atherosclerotic heart disease of native coronary artery without angina pectoris: Secondary | ICD-10-CM | POA: Insufficient documentation

## 2011-05-13 DIAGNOSIS — I252 Old myocardial infarction: Secondary | ICD-10-CM | POA: Insufficient documentation

## 2011-05-16 ENCOUNTER — Encounter (HOSPITAL_COMMUNITY)
Admission: RE | Admit: 2011-05-16 | Discharge: 2011-05-16 | Disposition: A | Discharge status: Home / Self Care | Payer: Medicare Other | Source: Ambulatory Visit | Attending: Cardiology | Admitting: Cardiology

## 2011-05-18 ENCOUNTER — Other Ambulatory Visit: Payer: Self-pay | Admitting: Gastroenterology

## 2011-05-18 ENCOUNTER — Encounter (HOSPITAL_COMMUNITY)
Admission: RE | Admit: 2011-05-18 | Discharge: 2011-05-18 | Disposition: A | Discharge status: Home / Self Care | Payer: Medicare Other | Source: Ambulatory Visit | Attending: Cardiology | Admitting: Cardiology

## 2011-05-20 ENCOUNTER — Encounter (HOSPITAL_COMMUNITY)
Admission: RE | Admit: 2011-05-20 | Discharge: 2011-05-20 | Disposition: A | Discharge status: Home / Self Care | Payer: Medicare Other | Source: Ambulatory Visit | Attending: Cardiology | Admitting: Cardiology

## 2011-05-23 ENCOUNTER — Encounter (HOSPITAL_COMMUNITY)
Admission: RE | Admit: 2011-05-23 | Discharge: 2011-05-23 | Disposition: A | Discharge status: Home / Self Care | Payer: Medicare Other | Source: Ambulatory Visit | Attending: Cardiology | Admitting: Cardiology

## 2011-05-25 ENCOUNTER — Encounter: Payer: Self-pay | Admitting: Cardiology

## 2011-05-25 ENCOUNTER — Encounter (HOSPITAL_COMMUNITY)
Admission: RE | Admit: 2011-05-25 | Discharge: 2011-05-25 | Disposition: A | Discharge status: Home / Self Care | Payer: Medicare Other | Source: Ambulatory Visit | Attending: Cardiology | Admitting: Cardiology

## 2011-05-25 ENCOUNTER — Ambulatory Visit (INDEPENDENT_AMBULATORY_CARE_PROVIDER_SITE_OTHER): Payer: Medicare Other | Admitting: Cardiology

## 2011-05-25 DIAGNOSIS — E782 Mixed hyperlipidemia: Secondary | ICD-10-CM

## 2011-05-25 DIAGNOSIS — I10 Essential (primary) hypertension: Secondary | ICD-10-CM | POA: Insufficient documentation

## 2011-05-25 DIAGNOSIS — C189 Malignant neoplasm of colon, unspecified: Secondary | ICD-10-CM | POA: Insufficient documentation

## 2011-05-25 DIAGNOSIS — R634 Abnormal weight loss: Secondary | ICD-10-CM | POA: Insufficient documentation

## 2011-05-25 DIAGNOSIS — I2510 Atherosclerotic heart disease of native coronary artery without angina pectoris: Secondary | ICD-10-CM | POA: Insufficient documentation

## 2011-05-25 DIAGNOSIS — A310 Pulmonary mycobacterial infection: Secondary | ICD-10-CM

## 2011-05-25 DIAGNOSIS — D638 Anemia in other chronic diseases classified elsewhere: Secondary | ICD-10-CM

## 2011-05-25 DIAGNOSIS — R911 Solitary pulmonary nodule: Secondary | ICD-10-CM

## 2011-05-25 DIAGNOSIS — K219 Gastro-esophageal reflux disease without esophagitis: Secondary | ICD-10-CM | POA: Insufficient documentation

## 2011-05-25 MED ORDER — BISOPROLOL-HYDROCHLOROTHIAZIDE 5-6.25 MG PO TABS: ORAL_TABLET | ORAL | Status: DC

## 2011-05-25 NOTE — Assessment & Plan Note (Signed)
CBC will be reassessed in one month.

## 2011-05-25 NOTE — Assessment & Plan Note (Signed)
Creatinine less than 1.0 when last assessed in 01/2011.  Chemistry profile will be repeated in one month.

## 2011-05-25 NOTE — Assessment & Plan Note (Addendum)
Coronary disease has been stable since bare metal stent placement in the distal LAD 5 months ago.  Small myocardial infarction, normal left ventricular ejection fraction, insignificant coronary disease in other vessels except the 1st OM and asymptomatic status portend an excellent prognosis.  We will continue to optimize control of cardiovascular risk factors.  No recent lipid profile is available-one will be obtained.

## 2011-05-25 NOTE — Assessment & Plan Note (Signed)
Blood pressure control is slightly suboptimal.  Ziac dosage will be doubled and electrolytes and renal function reassessed in one month.

## 2011-05-25 NOTE — Patient Instructions (Addendum)
Your physician recommends that you return for lab work in: 1 month  Your physician has recommended you make the following change in your medication: increase Bisoprolol HCT to 10/12.5 (take  two tablets of 5/6.25 mg tablets daily)  Your physician is referring you to [facility], we will arrange.  Your physician has requested that you regularly monitor and record your blood pressure readings at home. Please use the same machine at the same time of day to check your readings and record them to bring to your follow-up visit.  Your physician recommends that you schedule a follow-up appointment in: 6 months

## 2011-05-25 NOTE — Progress Notes (Signed)
PatienonD: Kayla Lane, female   DOB: 07-24-1925, 78 y.o.   MRN: [id number] HPI: Scheduled return visit for continued assessment and treatment of cardiovascular risk factors and coronary artery disease.  Since she was last seen a few months ago, she has done quite well.  Weight loss, which apparently was due to MAI infection, has reversed, and she has gained 12 pounds from her nadir.  She denies chest discomfort or dyspnea despite doing all of her housework and yard work in season.  She has developed no new medical problems nor has she required urgent or emergent medical care in recent months.  Prior to Admission medications   Medication Sig Start Date End Date Taking? Authorizing Provider  aspirin EC 81 MG tablet Take 81 mg by mouth at bedtime.     Yes Historical Provider, MD  benzonatate (TESSALON) 100 MG capsule Take 100 mg by mouth 3 (three) times daily as needed.     Yes Historical Provider, MD  bisoprolol-hydrochlorothiazide (ZIAC) 5-6.25 MG per tablet Take 1 tablet by mouth daily.   Yes Historical Provider, MD  calcium-vitamin D (OSCAL WITH D) 500-200 MG-UNIT per tablet Take 1 tablet by mouth daily.     Yes Historical Provider, MD  clopidogrel (PLAVIX) 75 MG tablet Take 75 mg by mouth daily.  03/02/11  Yes Historical Provider, MD  HYDROcodone-acetaminophen (NORCO) 5-325 MG per tablet Take 1 tablet by mouth every 6 (six) hours as needed. For pain    Yes Historical Provider, MD  nitroGLYCERIN (NITROSTAT) 0.4 MG SL tablet Place 1 tablet (0.4 mg total) under the tongue every 5 (five) minutes as needed. 02/21/11  Yes Joni Reining, NP  PREVACID 24HR 15 MG capsule TAKE ONE CAPSULE DAILY. 05/18/11  Yes Gerrit Halls, NP  simvastatin (ZOCOR) 40 MG tablet Take 1 tablet (40 mg total) by mouth at bedtime. 02/21/11  Yes Joni Reining, NP  vitamin B-12 (CYANOCOBALAMIN) 100 MCG tablet Take 50 mcg by mouth daily.     Yes Historical Provider, MD   Allergies  Allergen Reactions  . Iodinated Diagnostic  Agents   . Penicillins     REACTION: Unknown reaction  . Sulfonamide Derivatives     REACTION: Unknown reaction  . Tetracycline     REACTION: Unknown reaction  Past medical history, social history, and family history reviewed and updated.  ROS: Denies orthopnea, PND, dyspnea on exertion, chest discomfort, lightheadedness, palpitations, syncope and pedal edema.  PHYSICAL EXAM: BP 146/76  Pulse 68  Resp 16  Ht 5\' 2"  (1.575 m)  Wt 48.535 kg (107 lb)  BMI 19.57 kg/m2  General-Well developed; no acute distress Body habitus-thin Neck-No JVD; no carotid bruits Lungs-clear lung fields; resonant to percussion; mild kyphosis Cardiovascular-normal PMI; normal S1 and S2; occasional prematures Abdomen-normal bowel sounds; soft and non-tender without masses or organomegaly Musculoskeletal-No deformities, no cyanosis or clubbing Neurologic-Normal cranial nerves; symmetric strength and tone Skin-Warm, no significant lesions Extremities-distal pulses intact; no edema  Rhythm Strip: normal sinus rhythm at a rate of 70 bpm; normal intervals.  ASSESSMENT AND PLAN:  [place] Bing, MD 05/25/2011 10:56 AM

## 2011-05-25 NOTE — Assessment & Plan Note (Signed)
Carotid ultrasound demonstrates progression of cerebrovascular disease to near critical status.  Consultation with a vascular surgeon will be arranged.

## 2011-05-27 ENCOUNTER — Encounter (HOSPITAL_COMMUNITY)
Admission: RE | Admit: 2011-05-27 | Discharge: 2011-05-27 | Disposition: A | Discharge status: Home / Self Care | Payer: Medicare Other | Source: Ambulatory Visit | Attending: Cardiology | Admitting: Cardiology

## 2011-05-30 ENCOUNTER — Encounter (HOSPITAL_COMMUNITY)
Admission: RE | Admit: 2011-05-30 | Discharge: 2011-05-30 | Disposition: A | Discharge status: Home / Self Care | Payer: Medicare Other | Source: Ambulatory Visit | Attending: Cardiology | Admitting: Cardiology

## 2011-06-01 ENCOUNTER — Other Ambulatory Visit: Payer: Self-pay | Admitting: *Deleted

## 2011-06-01 ENCOUNTER — Encounter (HOSPITAL_COMMUNITY)
Admission: RE | Admit: 2011-06-01 | Discharge: 2011-06-01 | Disposition: A | Discharge status: Home / Self Care | Payer: Medicare Other | Source: Ambulatory Visit | Attending: Cardiology | Admitting: Cardiology

## 2011-06-01 DIAGNOSIS — I10 Essential (primary) hypertension: Secondary | ICD-10-CM

## 2011-06-01 DIAGNOSIS — E782 Mixed hyperlipidemia: Secondary | ICD-10-CM

## 2011-06-02 ENCOUNTER — Encounter: Payer: Self-pay | Admitting: *Deleted

## 2011-06-02 LAB — COMPREHENSIVE METABOLIC PANEL
ALT: 14 U/L (ref 0–35)
Albumin: 4.3 g/dL (ref 3.5–5.2)
Alkaline Phosphatase: 118 U/L — ABNORMAL HIGH (ref 39–117)
CO2: 21 mEq/L (ref 19–32)
Glucose, Bld: 95 mg/dL (ref 70–99)
Potassium: 4.9 mEq/L (ref 3.5–5.3)
Sodium: 140 mEq/L (ref 135–145)
Total Protein: 7.3 g/dL (ref 6.0–8.3)

## 2011-06-02 LAB — CBC WITH DIFFERENTIAL/PLATELET
Basophils Absolute: 0 10*3/uL (ref 0.0–0.1)
Eosinophils Absolute: 0.4 10*3/uL (ref 0.0–0.7)
Lymphs Abs: 1.4 10*3/uL (ref 0.7–4.0)
MCH: 30 pg (ref 26.0–34.0)
Neutrophils Relative %: 62 % (ref 43–77)
Platelets: 230 10*3/uL (ref 150–400)
RBC: 4.3 MIL/uL (ref 3.87–5.11)
RDW: 13.6 % (ref 11.5–15.5)
WBC: 6.9 10*3/uL (ref 4.0–10.5)

## 2011-06-03 ENCOUNTER — Encounter (HOSPITAL_COMMUNITY)
Admission: RE | Admit: 2011-06-03 | Discharge: 2011-06-03 | Disposition: A | Discharge status: Home / Self Care | Payer: Medicare Other | Source: Ambulatory Visit | Attending: Cardiology | Admitting: Cardiology

## 2011-06-06 ENCOUNTER — Encounter (HOSPITAL_COMMUNITY)
Admission: RE | Admit: 2011-06-06 | Discharge: 2011-06-06 | Disposition: A | Discharge status: Home / Self Care | Payer: Medicare Other | Source: Ambulatory Visit | Attending: Cardiology | Admitting: Cardiology

## 2011-06-08 ENCOUNTER — Encounter (HOSPITAL_COMMUNITY)
Admission: RE | Admit: 2011-06-08 | Discharge: 2011-06-08 | Disposition: A | Discharge status: Home / Self Care | Payer: Medicare Other | Source: Ambulatory Visit | Attending: Cardiology | Admitting: Cardiology

## 2011-06-10 ENCOUNTER — Encounter (HOSPITAL_COMMUNITY)
Admission: RE | Admit: 2011-06-10 | Discharge: 2011-06-10 | Disposition: A | Discharge status: Home / Self Care | Payer: Medicare Other | Source: Ambulatory Visit | Attending: Cardiology | Admitting: Cardiology

## 2011-06-10 DIAGNOSIS — Z5189 Encounter for other specified aftercare: Secondary | ICD-10-CM | POA: Insufficient documentation

## 2011-06-10 DIAGNOSIS — I2510 Atherosclerotic heart disease of native coronary artery without angina pectoris: Secondary | ICD-10-CM | POA: Insufficient documentation

## 2011-06-10 DIAGNOSIS — I252 Old myocardial infarction: Secondary | ICD-10-CM | POA: Insufficient documentation

## 2011-06-13 ENCOUNTER — Encounter (HOSPITAL_COMMUNITY)
Admission: RE | Admit: 2011-06-13 | Discharge: 2011-06-13 | Disposition: A | Discharge status: Home / Self Care | Payer: Medicare Other | Source: Ambulatory Visit | Attending: Cardiology | Admitting: Cardiology

## 2011-06-15 ENCOUNTER — Encounter (HOSPITAL_COMMUNITY)
Admission: RE | Admit: 2011-06-15 | Discharge: 2011-06-15 | Disposition: A | Discharge status: Home / Self Care | Payer: Medicare Other | Source: Ambulatory Visit | Attending: Cardiology | Admitting: Cardiology

## 2011-06-17 ENCOUNTER — Other Ambulatory Visit: Payer: Self-pay | Admitting: *Deleted

## 2011-06-17 ENCOUNTER — Encounter (HOSPITAL_COMMUNITY)
Admission: RE | Admit: 2011-06-17 | Discharge: 2011-06-17 | Disposition: A | Discharge status: Home / Self Care | Payer: Medicare Other | Source: Ambulatory Visit | Attending: Cardiology | Admitting: Cardiology

## 2011-06-17 DIAGNOSIS — E782 Mixed hyperlipidemia: Secondary | ICD-10-CM

## 2011-06-17 DIAGNOSIS — I10 Essential (primary) hypertension: Secondary | ICD-10-CM

## 2011-06-20 ENCOUNTER — Encounter (HOSPITAL_COMMUNITY)
Admission: RE | Admit: 2011-06-20 | Discharge: 2011-06-20 | Disposition: A | Discharge status: Home / Self Care | Payer: Medicare Other | Source: Ambulatory Visit | Attending: Cardiology | Admitting: Cardiology

## 2011-06-22 ENCOUNTER — Encounter (HOSPITAL_COMMUNITY): Payer: Medicare Other

## 2011-06-24 ENCOUNTER — Encounter (HOSPITAL_COMMUNITY)
Admission: RE | Admit: 2011-06-24 | Discharge: 2011-06-24 | Disposition: A | Discharge status: Home / Self Care | Payer: Medicare Other | Source: Ambulatory Visit | Attending: Cardiology | Admitting: Cardiology

## 2011-06-24 DIAGNOSIS — Z9861 Coronary angioplasty status: Secondary | ICD-10-CM

## 2011-06-27 ENCOUNTER — Encounter: Payer: Self-pay | Admitting: *Deleted

## 2011-06-27 ENCOUNTER — Encounter (HOSPITAL_COMMUNITY)
Admission: RE | Admit: 2011-06-27 | Discharge: 2011-06-27 | Disposition: A | Discharge status: Home / Self Care | Payer: Medicare Other | Source: Ambulatory Visit | Attending: Cardiology | Admitting: Cardiology

## 2011-06-29 ENCOUNTER — Encounter (HOSPITAL_COMMUNITY)
Admission: RE | Admit: 2011-06-29 | Discharge: 2011-06-29 | Disposition: A | Discharge status: Home / Self Care | Payer: Medicare Other | Source: Ambulatory Visit | Attending: Cardiology | Admitting: Cardiology

## 2011-07-01 ENCOUNTER — Encounter (HOSPITAL_COMMUNITY): Payer: Medicare Other

## 2011-07-04 ENCOUNTER — Encounter (HOSPITAL_COMMUNITY)
Admission: RE | Admit: 2011-07-04 | Discharge: 2011-07-04 | Disposition: A | Discharge status: Home / Self Care | Payer: Medicare Other | Source: Ambulatory Visit | Attending: Cardiology | Admitting: Cardiology

## 2011-07-06 ENCOUNTER — Encounter (HOSPITAL_COMMUNITY)
Admission: RE | Admit: 2011-07-06 | Discharge: 2011-07-06 | Disposition: A | Discharge status: Home / Self Care | Payer: Medicare Other | Source: Ambulatory Visit | Attending: Cardiology | Admitting: Cardiology

## 2011-07-08 ENCOUNTER — Encounter (HOSPITAL_COMMUNITY)
Admission: RE | Admit: 2011-07-08 | Discharge: 2011-07-08 | Disposition: A | Discharge status: Home / Self Care | Payer: Medicare Other | Source: Ambulatory Visit | Attending: Cardiology | Admitting: Cardiology

## 2011-07-11 ENCOUNTER — Encounter (HOSPITAL_COMMUNITY)
Admission: RE | Admit: 2011-07-11 | Discharge: 2011-07-11 | Disposition: A | Discharge status: Home / Self Care | Payer: Medicare Other | Source: Ambulatory Visit | Attending: Cardiology | Admitting: Cardiology

## 2011-07-11 DIAGNOSIS — I2510 Atherosclerotic heart disease of native coronary artery without angina pectoris: Secondary | ICD-10-CM | POA: Insufficient documentation

## 2011-07-11 DIAGNOSIS — Z5189 Encounter for other specified aftercare: Secondary | ICD-10-CM | POA: Insufficient documentation

## 2011-07-11 DIAGNOSIS — I252 Old myocardial infarction: Secondary | ICD-10-CM | POA: Insufficient documentation

## 2011-07-15 ENCOUNTER — Encounter (HOSPITAL_COMMUNITY)
Admission: RE | Admit: 2011-07-15 | Discharge: 2011-07-15 | Disposition: A | Discharge status: Home / Self Care | Payer: Medicare Other | Source: Ambulatory Visit | Attending: Cardiology | Admitting: Cardiology

## 2011-07-20 ENCOUNTER — Encounter: Payer: Self-pay | Admitting: Vascular Surgery

## 2011-07-21 ENCOUNTER — Encounter: Payer: Self-pay | Admitting: Vascular Surgery

## 2011-07-21 ENCOUNTER — Ambulatory Visit (INDEPENDENT_AMBULATORY_CARE_PROVIDER_SITE_OTHER): Payer: Medicare Other | Admitting: Vascular Surgery

## 2011-07-21 ENCOUNTER — Ambulatory Visit (INDEPENDENT_AMBULATORY_CARE_PROVIDER_SITE_OTHER): Payer: Medicare Other | Admitting: *Deleted

## 2011-07-21 VITALS — BP 146/53 | HR 64 | Resp 20 | Ht 62.0 in | Wt 107.0 lb

## 2011-07-21 DIAGNOSIS — I6529 Occlusion and stenosis of unspecified carotid artery: Secondary | ICD-10-CM

## 2011-07-21 NOTE — Progress Notes (Signed)
History of Present Illness:  Patient is a 78 y.o. year old female who presents for evaluation of carotid stenosis.  Patient is referred by Dr. Dietrich Pates The patient denies symptoms of TIA, amaurosis, or stroke.  The patient is currently on aspirin and Plavix antiplatelet therapy.  The carotid stenosis was found screening duplex exam. Other medical problems include coronary disease with myocardial infarction in October of 2012 and coronary stenting at that time, hypertension, hyperlipidemia  These are currently stable and followed by .  Past Medical History  Diagnosis Date  . Arteriosclerotic cardiovascular disease (ASCVD) 1994     Stent to RCA and LAD at [facility] in 1994; neg. stress nuclear 12/98; 04/2005:40% LAD; 70% CX;  echocardiogram in 2009-normal EF; mild to moderate MR.; 01/2011  Non-ST segment elevation MI->  BMS to the distal LAD ,40% in-stent stenosis in proximal LAD, 90% proximal OM1, 40-50% in-stent RCA stenosis, 50% PDA  . Cerebrovascular disease     bilateral bruits; 7/08-50-69% right and left internal carotid artery stenosis;  2011: 70-80% right; 50% left   . Hypertension   . Hyperlipidemia   . Spinal stenosis   . Colitis 2011    2011  . Degenerative joint disease   . Gastroesophageal reflux disease 2002    Hiatal hernia; dysphasia due to cervical web-dilated in 2002  . Colon carcinoma 1996    left hemicolectomy  . Weight loss 2009    30 pounds 2009-2011  . Mycobacterium avium-intracellulare infection   . Lung nodule 01/2011    right lower lobe; re-image in 08/2011    Past Surgical History  Procedure Date  . Total abdominal hysterectomy   . Appendectomy   . Partial colectomy 1996    Left for carcinoma  . Kyphosis surgery     Percutaneous  . Tonsillectomy   . Colonoscopy 01/2009; 11/2009    Dr. Rourk-->surgical anastomosis at 18cm. Residual rectal and colonic mucosa appeared normal; Dr. Marlowe Alt papilla, surgical anastomosis at 18cm  . Esophagogastroduodenoscopy 01/2009;  11/2009    Dr. Carron Curie cervical esophageal web s/p dilation, small hh; Dr. Ronni Rumble hh  . Coronary stent placement 02/02/11  . Bladder suspension   . Cataract extraction     Bilateral     Social History History  Substance Use Topics  . Smoking status: Never Smoker   . Smokeless tobacco: Never Used  . Alcohol Use: No    Family History Family History  Problem Relation Age of Onset  . Diabetes type II Other   . Kidney cancer Brother   . Hypertension Mother   . Diabetes Sister   . Heart disease Father     Allergies  Allergies  Allergen Reactions  . Iodinated Diagnostic Agents   . Penicillins     REACTION: Unknown reaction  . Sulfonamide Derivatives     REACTION: Unknown reaction  . Tetracycline     REACTION: Unknown reaction     Current Outpatient Prescriptions  Medication Sig Dispense Refill  . aspirin EC 81 MG tablet Take 81 mg by mouth at bedtime.        . benzonatate (TESSALON) 100 MG capsule Take 100 mg by mouth 3 (three) times daily as needed.        . bisoprolol-hydrochlorothiazide (ZIAC) 5-6.25 MG per tablet Take 2 tablets daily  60 tablet  6  . calcium-vitamin D (OSCAL WITH D) 500-200 MG-UNIT per tablet Take 1 tablet by mouth daily.        . clopidogrel (PLAVIX) 75 MG tablet Take  75 mg by mouth daily.       Marland Kitchen HYDROcodone-acetaminophen (NORCO) 5-325 MG per tablet Take 1 tablet by mouth every 6 (six) hours as needed. For pain       . nitroGLYCERIN (NITROSTAT) 0.4 MG SL tablet Place 1 tablet (0.4 mg total) under the tongue every 5 (five) minutes as needed.  25 tablet  3  . PREVACID 24HR 15 MG capsule TAKE ONE CAPSULE DAILY.  30 capsule  11  . simvastatin (ZOCOR) 40 MG tablet Take 1 tablet (40 mg total) by mouth at bedtime.  90 tablet  1  . vitamin B-12 (CYANOCOBALAMIN) 100 MCG tablet Take 50 mcg by mouth daily.          ROS:   General:  No Fever, chills  HEENT: No recent headaches, no nasal bleeding, no visual changes, no sore  throat  Neurologic: No dizziness, blackouts, seizures. No recent symptoms of stroke or mini- stroke. No recent episodes of slurred speech, or temporary blindness.  Cardiac: No recent episodes of chest pain/pressure, no shortness of breath at rest.  No shortness of breath with exertion.  Denies history of atrial fibrillation or irregular heartbeat  Vascular: No history of rest pain in feet.  No history of claudication.  No history of non-healing ulcer, No history of DVT   Pulmonary: No home oxygen, no productive cough, no hemoptysis,  + wheezing  Musculoskeletal:  [x ] Arthritis, [ ]  Low back pain,  [ ]  Joint pain  Hematologic:No history of hypercoagulable state.  No history of easy bleeding.  No history of anemia  Gastrointestinal: No hematochezia or melena  Urinary: [ ]  chronic Kidney disease, [ ]  on HD - [ ]  MWF or [ ]  TTHS, [ ]  Burning with urination, [ ]  Frequent urination, [ ]  Difficulty urinating;   Skin: No rashes  Psychological: No history of anxiety,  No history of depression   Physical Examination  Filed Vitals:   07/21/11 1108  BP: 146/53  Pulse: 64  Resp: 20  Height: 5\' 2"  (1.575 m)  Weight: 107 lb (48.535 kg)    Body mass index is 19.57 kg/(m^2).  General:  Alert and oriented, no acute distress HEENT: Normal Neck: No bruit or JVD Pulmonary: Bilateral expiratory wheeze Cardiac: Regular Rate and Rhythm without murmur Gastrointestinal: Soft, non-tender, non-distended, no mass, no scars Skin: No rash Extremity Pulses:  2+ radial bilaterally Musculoskeletal: No deformity or edema  Neurologic: Upper and lower extremity motor 5/5 and symmetric  DATA: I reviewed her carotid duplex scan dated September 2011 which showed high-grade stenosis of her right external carotid artery and apparently 70-80% stenosis of the internal carotid artery. This also showed a 50% stenosis of the left carotid. She had a repeat duplex exam today which I reviewed and interpreted. On our  duplex exam today the stenosis on the right side was 40-60% with the stenosis in the left side less than 40% the right external carotid artery stenosis was again noted   ASSESSMENT: Asymptomatic moderate right internal carotid artery stenosis   PLAN:  Continue antiplatelet therapy in the form of aspirin and Plavix. The patient will have a repeat carotid duplex exam in 6 months time. If her stenosis progresses to greater than 80% we would consider carotid endarterectomy or stenting at that time. If the patient develops symptoms of stroke or TIA we will also consider intervention.   Fabienne Bruns, MD [facility] Office: [phone] Pager: [phone]

## 2011-07-29 NOTE — Procedures (Unsigned)
CAROTID DUPLEX EXAM  INDICATION:  Carotid stenosis  HISTORY: Diabetes:  No Cardiac:  MI, CAD Hypertension:  No Smoking:  No Previous Surgery:  No CV History:  Currently asymptomatic Amaurosis Fugax No, Paresthesias No, Hemiparesis No                                      RIGHT             LEFT Brachial systolic pressure:         142               146 Brachial Doppler waveforms:         Normal            Normal Vertebral direction of flow:        Antegrade         Antegrade DUPLEX VELOCITIES (cm/sec) CCA peak systolic                   52                69 ECA peak systolic                   392               178 ICA peak systolic                   151               119 ICA end diastolic                   42                26 PLAQUE MORPHOLOGY:                  Heterogeneous     Heterogeneous PLAQUE AMOUNT:                      Moderate          Mild PLAQUE LOCATION:                    ICA/ECA           ICA/ECA  IMPRESSION:  Doppler velocity suggests 40% to 59% stenosis of the right proximal internal carotid artery and high end 1% to 39% stenosis of the left proximal internal carotid artery.  Right external carotid artery stenosis noted.      ___________________________________________ Kayla Hora Fields, MD  CH/MEDQ  D:  07/25/2011  T:  07/25/2011  Job:  [id number]

## 2011-09-05 ENCOUNTER — Emergency Department (HOSPITAL_COMMUNITY): Payer: Medicare Other

## 2011-09-05 ENCOUNTER — Emergency Department (HOSPITAL_COMMUNITY)
Admission: EM | Admit: 2011-09-05 | Discharge: 2011-09-05 | Disposition: A | Discharge status: Home / Self Care | Payer: Medicare Other | Attending: Emergency Medicine | Admitting: Emergency Medicine

## 2011-09-05 ENCOUNTER — Encounter (HOSPITAL_COMMUNITY): Payer: Self-pay | Admitting: *Deleted

## 2011-09-05 DIAGNOSIS — M79609 Pain in unspecified limb: Secondary | ICD-10-CM | POA: Insufficient documentation

## 2011-09-05 DIAGNOSIS — S5012XA Contusion of left forearm, initial encounter: Secondary | ICD-10-CM

## 2011-09-05 DIAGNOSIS — Z7902 Long term (current) use of antithrombotics/antiplatelets: Secondary | ICD-10-CM

## 2011-09-05 DIAGNOSIS — X58XXXA Exposure to other specified factors, initial encounter: Secondary | ICD-10-CM | POA: Insufficient documentation

## 2011-09-05 DIAGNOSIS — D6959 Other secondary thrombocytopenia: Secondary | ICD-10-CM | POA: Insufficient documentation

## 2011-09-05 DIAGNOSIS — K219 Gastro-esophageal reflux disease without esophagitis: Secondary | ICD-10-CM | POA: Insufficient documentation

## 2011-09-05 DIAGNOSIS — M7989 Other specified soft tissue disorders: Secondary | ICD-10-CM | POA: Insufficient documentation

## 2011-09-05 DIAGNOSIS — E785 Hyperlipidemia, unspecified: Secondary | ICD-10-CM | POA: Insufficient documentation

## 2011-09-05 DIAGNOSIS — S5010XA Contusion of unspecified forearm, initial encounter: Secondary | ICD-10-CM | POA: Insufficient documentation

## 2011-09-05 DIAGNOSIS — T4595XA Adverse effect of unspecified primarily systemic and hematological agent, initial encounter: Secondary | ICD-10-CM | POA: Insufficient documentation

## 2011-09-05 DIAGNOSIS — I10 Essential (primary) hypertension: Secondary | ICD-10-CM | POA: Insufficient documentation
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
## 2011-09-05 NOTE — Discharge Instructions (Signed)
Elevate your arm. Continue using the ice packs to keep the swelling from getting worse. Have Dr Phillips Odor recheck your arm in the next 2-3 days. Return to the ED if you get pain or numbness in your fingers, the pain gets severe, you have a red streak running up your arm or it seems worse.

## 2011-09-05 NOTE — ED Notes (Signed)
Good radial pulse

## 2011-10-04 ENCOUNTER — Other Ambulatory Visit: Payer: Self-pay | Admitting: Cardiology

## 2011-11-25 ENCOUNTER — Encounter: Payer: Self-pay | Admitting: Cardiology

## 2011-11-25 ENCOUNTER — Ambulatory Visit (INDEPENDENT_AMBULATORY_CARE_PROVIDER_SITE_OTHER): Payer: Medicare Other | Admitting: Cardiology

## 2011-11-25 VITALS — BP 154/73 | HR 77 | Ht 64.0 in | Wt 104.0 lb

## 2011-11-25 DIAGNOSIS — I2510 Atherosclerotic heart disease of native coronary artery without angina pectoris: Secondary | ICD-10-CM

## 2011-11-25 DIAGNOSIS — I10 Essential (primary) hypertension: Secondary | ICD-10-CM

## 2011-11-25 DIAGNOSIS — E785 Hyperlipidemia, unspecified: Secondary | ICD-10-CM

## 2011-11-25 DIAGNOSIS — I7090 Unspecified atherosclerosis: Secondary | ICD-10-CM

## 2011-11-25 DIAGNOSIS — I679 Cerebrovascular disease, unspecified: Secondary | ICD-10-CM

## 2011-11-25 DIAGNOSIS — B029 Zoster without complications: Secondary | ICD-10-CM | POA: Insufficient documentation

## 2011-11-25 MED ORDER — GABAPENTIN 100 MG PO CAPS: 100.0000 mg | ORAL_CAPSULE | Freq: Three times a day (TID) | ORAL | Status: DC

## 2011-11-25 MED ORDER — BISOPROLOL-HYDROCHLOROTHIAZIDE 5-6.25 MG PO TABS: 2.0000 | ORAL_TABLET | Freq: Every morning | ORAL | Status: AC

## 2011-11-25 NOTE — Progress Notes (Signed)
Patient ID: Kayla Lane, female   DOB: Jul 28, 1925, 78 y.o.   MRN: [id number] HPI: Scheduled return visit for this very nice older woman with coronary disease and multiple cardiovascular risk factors.  She does quite well from a cardiac standpoint with essentially no symptoms and good control of blood pressure, which she monitors at home.  She is 6 months out from a small myocardial infarction requiring stenting with no clinical symptoms to suggest restenosis or progression of disease.  She was evaluated by Dr. Darrick Penna for cerebrovascular disease.  A repeat carotid ultrasound suggested  moderate stenosis.  She has seen Dr. Juanetta Gosling in recent months for pulmonary symptoms.  It is not clear to me whether this is a reflection of her MAI infection or chronic bronchitis.  In any case, she has improved substantially under his care.  She also has developed herpes zoster in the perineal region.  After 2 months, there has been significant improvement, but discomfort persists.  She is treated with hydrocodone + acetaminophen and low dose gabapentin.  Prior to Admission medications   Medication Sig Start Date End Date Taking? Authorizing Provider  aspirin EC 81 MG tablet Take 81 mg by mouth at bedtime.     Yes Historical Provider, MD  benzonatate (TESSALON) 100 MG capsule Take 100 mg by mouth 3 (three) times daily as needed. For cough   Yes Historical Provider, MD  bisoprolol-hydrochlorothiazide (ZIAC) 5-6.25 MG per tablet Take 1 tablet by mouth every morning. 05/25/11  Yes Kathlen Brunswick, MD  Calcium Carbonate-Vitamin D (CALCIUM + D PO) Take by mouth daily.   Yes Historical Provider, MD  cholecalciferol (VITAMIN D) 1000 UNITS tablet Take 1,000 Units by mouth daily.   Yes Historical Provider, MD  clopidogrel (PLAVIX) 75 MG tablet Take 75 mg by mouth every morning.  03/02/11  Yes Historical Provider, MD  Cyanocobalamin (VITAMIN B-12 CR PO) Take 1 tablet by mouth daily.   Yes Historical Provider, MD    Cyanocobalamin (VITAMIN B-12 IJ) Inject as directed every 30 (thirty) days.   Yes Historical Provider, MD  gabapentin (NEURONTIN) 100 MG capsule Take 2 tablets by mouth at bedtime. 10/24/11  Yes Historical Provider, MD  HYDROcodone-acetaminophen (NORCO) 5-325 MG per tablet Take 1 tablet by mouth every 6 (six) hours as needed. For pain    Yes Historical Provider, MD  losartan (COZAAR) 100 MG tablet Take 100 mg by mouth daily.   Yes Historical Provider, MD  nitroGLYCERIN (NITROSTAT) 0.4 MG SL tablet Place 1 tablet (0.4 mg total) under the tongue every 5 (five) minutes as needed. 02/21/11  Yes Jodelle Gross, NP  PREVACID 24HR 15 MG capsule TAKE ONE CAPSULE DAILY. 05/18/11  Yes Nira Retort, NP  ZOCOR 40 MG tablet TAKE ONE TABLET AT BEDTIME. 10/04/11  Yes Kathlen Brunswick, MD   Allergies  Allergen Reactions  . Iodinated Diagnostic Agents   . Penicillins     REACTION: Unknown reaction  . Sulfonamide Derivatives     REACTION: Unknown reaction  . Tetracycline     REACTION: Unknown reaction     Past medical history, social history, and family history reviewed and updated.  ROS: Denies chest pain, dyspnea, orthopnea, PND, pedal edema.  She has never experienced paralysis, paresthesias, speech disturbance or impaired mentation.  All other systems reviewed and are negative.  PHYSICAL EXAM: BP 154/73  Pulse 77  Ht 5\' 4"  (1.626 m)  Wt 47.174 kg (104 lb)  BMI 17.85 kg/m2  General-Well developed Body habitus-Slight,  frail-appearing woman in no acute distress. Neck-No JVD; Right carotid bruit Lungs-clear lung fields; resonant to percussion; mild kyphosis Cardiovascular-normal PMI; normal S1 and increased S2; Modest systolic murmur at the left sternal border Abdomen-normal bowel sounds; soft and non-tender without masses or organomegaly Musculoskeletal-No deformities, no cyanosis or clubbing Neurologic-Normal cranial nerves; symmetric strength and tone Skin-Warm, no significant  lesions Extremities-distal pulses intact; Trace edema  ASSESSMENT AND PLAN:  [place] Bing, MD 11/25/2011 3:12 PM

## 2011-11-25 NOTE — Assessment & Plan Note (Addendum)
Excellent control of hyperlipidemia with maximum permitted dose of simvastatin, which will be continued.

## 2011-11-25 NOTE — Assessment & Plan Note (Signed)
Patient has been improving, and hopefully pain will ultimately resolve completely.  I suggested she increase gabapentin to 100 mg t.i.d. And then 200 mg t.i.d. If ineffective.  She will reconsult Dr. Juanetta Gosling if pain persists.

## 2011-11-25 NOTE — Assessment & Plan Note (Addendum)
She is nearly one year out from a small myocardial infarction without recurrent symptoms and with excellent exercise tolerance. We will continue to work with her in an attempt to achieve optimal control of cardiovascular risk factors.  She has been treated with clopidogrel since placement of a bare-metal stent in 01/2011. A one-year course of therapy with that medication was planned. The drug will be discontinued at her next office visit.

## 2011-11-25 NOTE — Assessment & Plan Note (Addendum)
No apparent significant renal disease with a recent creatinine in 05/2011 of 0.83 and values well less than 1.0 for at least the past 2 years.

## 2011-11-25 NOTE — Assessment & Plan Note (Signed)
Blood pressure has been somewhat elevated in recent months and gradually rising. Ziac dose will be doubled with continued monitoring of control of hypertension.  Dose of HCTZ he remains only 12.5 mg per day, but electrolytes and renal function will continue to be monitored.

## 2011-11-25 NOTE — Assessment & Plan Note (Signed)
Cerebrovascular disease remains a noncritical in this patient who has never experienced neurologic symptoms. Treatment with aspirin and periodic assessment by vascular surgery will be continued.

## 2011-11-25 NOTE — Patient Instructions (Addendum)
Your physician wants you to follow-up in: 6 months with Dr. Dietrich Pates.  You will receive a reminder letter in the mail two months in advance. If you don't receive a letter, please call our office to schedule the follow-up appointment.  LABS:  BMET today and again in 3 months.   Increase Gabapentin to 100mg  three times per day, if pain persists increase to 200mg  three times per day.  Increase Ziac to 2 tablets every morning.

## 2011-11-26 LAB — BASIC METABOLIC PANEL
BUN: 14 mg/dL (ref 6–23)
CO2: 30 mEq/L (ref 19–32)
Chloride: 103 mEq/L (ref 96–112)
Creat: 0.76 mg/dL (ref 0.50–1.10)
Glucose, Bld: 103 mg/dL — ABNORMAL HIGH (ref 70–99)

## 2011-11-28 ENCOUNTER — Encounter: Payer: Self-pay | Admitting: Cardiology

## 2011-11-29 ENCOUNTER — Encounter: Payer: Self-pay | Admitting: *Deleted

## 2011-11-29 ENCOUNTER — Telehealth: Payer: Self-pay | Admitting: *Deleted

## 2011-11-29 MED ORDER — LOSARTAN POTASSIUM 100 MG PO TABS: 50.0000 mg | ORAL_TABLET | Freq: Every day | ORAL | Status: DC

## 2011-11-29 NOTE — Telephone Encounter (Signed)
After speaking with patient regarding her lab results and recommendations given to her, per Dr Marvel Plan instruction, to cut losartan to 50 mg daily from 100 mg pt called back stating that she has not been taking losartan, therefore a nurse visit has been scheduled for tomorrow to go over patient's bottles of medication.

## 2011-11-30 ENCOUNTER — Telehealth: Payer: Self-pay | Admitting: *Deleted

## 2011-11-30 NOTE — Telephone Encounter (Signed)
Remove losartan for medication list. Amlodipine 5 mg per day Home blood pressure measurements Blood pressure check in 2 weeks when she returns for a repeat chemistry profile.

## 2011-11-30 NOTE — Telephone Encounter (Signed)
Patient came in today, per my request, as she has been confused as to what medicine she is supposed to be taking.  It was thought, at her last OV on 8/16 that she was on losartan 50 mg daily and Ziac was increased to 2 tablets a day, due to hypertension.  Upon reconciling her medications, losartan is not one she has been taking and states that she has not taken for a very long time.  I took her blood pressure, which revealed 149/69 on current regimen.  Advised her to continue current medications and I would call her if there were any further recommendations.

## 2011-12-01 ENCOUNTER — Other Ambulatory Visit: Payer: Self-pay | Admitting: *Deleted

## 2011-12-01 DIAGNOSIS — I10 Essential (primary) hypertension: Secondary | ICD-10-CM

## 2011-12-01 MED ORDER — AMLODIPINE BESYLATE 5 MG PO TABS: 5.0000 mg | ORAL_TABLET | Freq: Every day | ORAL | Status: DC

## 2011-12-15 ENCOUNTER — Ambulatory Visit (INDEPENDENT_AMBULATORY_CARE_PROVIDER_SITE_OTHER): Payer: Medicare Other | Admitting: *Deleted

## 2011-12-15 VITALS — BP 138/72 | HR 68 | Ht 64.0 in | Wt 105.0 lb

## 2011-12-15 DIAGNOSIS — E785 Hyperlipidemia, unspecified: Secondary | ICD-10-CM

## 2011-12-15 DIAGNOSIS — I7090 Unspecified atherosclerosis: Secondary | ICD-10-CM

## 2011-12-15 DIAGNOSIS — I10 Essential (primary) hypertension: Secondary | ICD-10-CM

## 2011-12-15 DIAGNOSIS — I679 Cerebrovascular disease, unspecified: Secondary | ICD-10-CM

## 2011-12-15 DIAGNOSIS — I2510 Atherosclerotic heart disease of native coronary artery without angina pectoris: Secondary | ICD-10-CM

## 2011-12-15 NOTE — Progress Notes (Signed)
Presents for blood pressure check s/p initiation of amlodipine 5 mg daily.  No complaints noted.  Brings a list of adequate blood pressures, for review.  Due for a BMET today.

## 2011-12-16 LAB — BASIC METABOLIC PANEL
BUN: 16 mg/dL (ref 6–23)
Calcium: 9.9 mg/dL (ref 8.4–10.5)
Creat: 0.89 mg/dL (ref 0.50–1.10)
Glucose, Bld: 84 mg/dL (ref 70–99)
Potassium: 4.4 mEq/L (ref 3.5–5.3)

## 2011-12-16 NOTE — Progress Notes (Signed)
Patient ID: Kayla Lane, female   DOB: 01-08-1926, 78 y.o.   MRN: [id number]  Home blood pressure results are excellent, and blood pressure in clinic acceptable.  Continue current medications.

## 2012-01-19 ENCOUNTER — Other Ambulatory Visit: Payer: Self-pay | Admitting: *Deleted

## 2012-01-19 DIAGNOSIS — I6529 Occlusion and stenosis of unspecified carotid artery: Secondary | ICD-10-CM

## 2012-01-25 ENCOUNTER — Encounter: Payer: Self-pay | Admitting: Neurosurgery

## 2012-01-26 ENCOUNTER — Encounter: Payer: Self-pay | Admitting: Neurosurgery

## 2012-01-26 ENCOUNTER — Ambulatory Visit (INDEPENDENT_AMBULATORY_CARE_PROVIDER_SITE_OTHER): Payer: Medicare Other | Admitting: Neurosurgery

## 2012-01-26 ENCOUNTER — Other Ambulatory Visit (INDEPENDENT_AMBULATORY_CARE_PROVIDER_SITE_OTHER): Payer: Medicare Other | Admitting: *Deleted

## 2012-01-26 ENCOUNTER — Telehealth: Payer: Self-pay | Admitting: Cardiology

## 2012-01-26 VITALS — BP 136/61 | HR 61 | Resp 14 | Ht 62.0 in | Wt 101.0 lb

## 2012-01-26 DIAGNOSIS — I6529 Occlusion and stenosis of unspecified carotid artery: Secondary | ICD-10-CM

## 2012-01-26 NOTE — Telephone Encounter (Signed)
Please advise 

## 2012-01-26 NOTE — Telephone Encounter (Signed)
Patient wants to know how much longer she will have to be on Plavix because she has to go to [facility] and get injections in her back for pain but she can not have those while on Plavix. / tg

## 2012-01-26 NOTE — Progress Notes (Signed)
[facility] Carotid Office Note  CC: Carotid surveillance Referring Physician: Fields  History of Present Illness: 78 year old female patient of Dr. Darrick Penna with known history of carotid intervention. The patient denies signs or symptoms of CVA, TIA, amaurosis fugax or any neural deficit. The patient denies any new medical diagnoses or recent surgery.  Past Medical History  Diagnosis Date  . Arteriosclerotic cardiovascular disease (ASCVD) 1994     Stent to RCA and LAD at [facility] in 1994; neg. stress nuclear 12/98; 04/2005:40% LAD; 70% CX;  echocardiogram in 2009-normal EF; mild to moderate MR.; 01/2011  Non-ST segment elevation MI->  BMS to the distal LAD ,40% in-stent stenosis in proximal LAD, 90% proximal OM1, 40-50% in-stent RCA stenosis, 50% PDA  . Cerebrovascular disease     bilateral bruits; 7/08-50-69% right and left internal carotid artery stenosis;  2011: 70-80% right; 50% left   . Hypertension   . Hyperlipidemia   . Spinal stenosis   . Colitis 2011    2011  . Degenerative joint disease   . Gastroesophageal reflux disease 2002    Hiatal hernia; dysphasia due to cervical web-dilated in 2002  . Colon carcinoma 1996    left hemicolectomy  . Weight loss 2009    30 pounds 2009-2011  . Mycobacterium avium-intracellulare infection   . Lung nodule 01/2011    right lower lobe; re-image in 08/2011    ROS: [x]  Positive   [ ]  Denies    General: [ ]  Weight loss, [ ]  Fever, [ ]  chills Neurologic: [ ]  Dizziness, [ ]  Blackouts, [ ]  Seizure [ ]  Stroke, [ ]  "Mini stroke", [ ]  Slurred speech, [ ]  Temporary blindness; [ ]  weakness in arms or legs, [ ]  Hoarseness Cardiac: [ ]  Chest pain/pressure, [ ]  Shortness of breath at rest [ ]  Shortness of breath with exertion, [ ]  Atrial fibrillation or irregular heartbeat Vascular: [ ]  Pain in legs with walking, [ ]  Pain in legs at rest, [ ]  Pain in legs at night,  [ ]  Non-healing ulcer, [ ]  Blood clot in vein/DVT,     Pulmonary: [ ]  Home oxygen, [ ]  Productive cough, [ ]  Coughing up blood, [ ]  Asthma,  [ ]  Wheezing Musculoskeletal:  [ ]  Arthritis, [ ]  Low back pain, [ ]  Joint pain Hematologic: [ ]  Easy Bruising, [ ]  Anemia; [ ]  Hepatitis Gastrointestinal: [ ]  Blood in stool, [ ]  Gastroesophageal Reflux/heartburn, [ ]  Trouble swallowing Urinary: [ ]  chronic Kidney disease, [ ]  on HD - [ ]  MWF or [ ]  TTHS, [ ]  Burning with urination, [ ]  Difficulty urinating Skin: [ ]  Rashes, [ ]  Wounds Psychological: [ ]  Anxiety, [ ]  Depression   Social History History  Substance Use Topics  . Smoking status: Never Smoker   . Smokeless tobacco: Never Used  . Alcohol Use: No    Family History Family History  Problem Relation Age of Onset  . Diabetes type II Other   . Kidney cancer Brother   . Hypertension Mother   . Diabetes Sister   . Heart disease Father     Allergies  Allergen Reactions  . Iodinated Diagnostic Agents   . Penicillins     REACTION: Unknown reaction  . Sulfonamide Derivatives     REACTION: Unknown reaction  . Tetracycline     REACTION: Unknown reaction    Current Outpatient Prescriptions  Medication Sig Dispense Refill  . amLODipine (NORVASC) 5 MG tablet Take 1 tablet (5 mg  total) by mouth daily.  30 tablet  11  . aspirin EC 81 MG tablet Take 81 mg by mouth at bedtime.        . benzonatate (TESSALON) 100 MG capsule Take 100 mg by mouth 3 (three) times daily as needed. For cough      . bisoprolol-hydrochlorothiazide (ZIAC) 5-6.25 MG per tablet Take 2 tablets by mouth every morning.  180 tablet  3  . cholecalciferol (VITAMIN D) 1000 UNITS tablet Take 1,000 Units by mouth daily.      . clopidogrel (PLAVIX) 75 MG tablet Take 75 mg by mouth every morning.       . Cyanocobalamin (VITAMIN B-12 CR PO) Take 1 tablet by mouth daily.      . Cyanocobalamin (VITAMIN B-12 IJ) Inject as directed every 30 (thirty) days.      Marland Kitchen gabapentin (NEURONTIN) 100 MG capsule Take 1 capsule (100 mg total) by  mouth 3 (three) times daily.  270 capsule  3  . HYDROcodone-acetaminophen (NORCO) 5-325 MG per tablet Take 1 tablet by mouth every 6 (six) hours as needed. For pain       . nitroGLYCERIN (NITROSTAT) 0.4 MG SL tablet Place 1 tablet (0.4 mg total) under the tongue every 5 (five) minutes as needed.  25 tablet  3  . PREVACID 24HR 15 MG capsule TAKE ONE CAPSULE DAILY.  30 capsule  11  . ZOCOR 40 MG tablet TAKE ONE TABLET AT BEDTIME.  30 each  10    Physical Examination  Filed Vitals:   01/26/12 1132  BP: 136/61  Pulse: 61  Resp:     Body mass index is 18.47 kg/(m^2).  General:  WDWN in NAD Gait: Normal HEENT: WNL Eyes: Pupils equal Pulmonary: normal non-labored breathing , without Rales, rhonchi,  wheezing Cardiac: RRR, without  Murmurs, rubs or gallops; Abdomen: soft, NT, no masses Skin: no rashes, ulcers noted  Vascular Exam Pulses: 3+ radial pulses bilaterally Carotid bruits: Carotid pulses to auscultation no bruits are heard Extremities without ischemic changes, no Gangrene , no cellulitis; no open wounds;  Musculoskeletal: no muscle wasting or atrophy   Neurologic: A&O X 3; Appropriate Affect ; SENSATION: normal; MOTOR FUNCTION:  moving all extremities equally. Speech is fluent/normal  Non-Invasive Vascular Imaging CAROTID DUPLEX 01/26/2012  Right ICA 40 - 59 % stenosis Left ICA 20 - 39 % stenosis   ASSESSMENT/PLAN: Asymptomatic patient with mild/moderate bilateral carotid stenosis. The patient knows the signs and symptoms of CVA and knows to report to the nearest emergency department should that occur. The patient will followup in one year with a repeat carotid duplex, her questions were encouraged and answered, she is in agreement with this plan.  Lauree Chandler ANP   Clinic MD: Darrick Penna

## 2012-01-27 NOTE — Addendum Note (Signed)
Addended by: Sharee Pimple on: 01/27/2012 07:55 AM   Modules accepted: Orders

## 2012-02-03 NOTE — Telephone Encounter (Signed)
Clopidogrel can be discontinued this month or early next month when current prescription has been exhausted.

## 2012-02-03 NOTE — Telephone Encounter (Signed)
Advised patient of recommendations. Verbalized understanding

## 2012-02-10 ENCOUNTER — Encounter: Payer: Self-pay | Admitting: Cardiology

## 2012-02-10 DIAGNOSIS — M1990 Unspecified osteoarthritis, unspecified site: Secondary | ICD-10-CM

## 2012-02-10 DIAGNOSIS — J479 Bronchiectasis, uncomplicated: Secondary | ICD-10-CM | POA: Insufficient documentation

## 2012-02-10 HISTORY — DX: Unspecified osteoarthritis, unspecified site: M19.90

## 2012-02-13 ENCOUNTER — Other Ambulatory Visit: Payer: Self-pay | Admitting: Cardiology

## 2012-02-14 LAB — BASIC METABOLIC PANEL
Calcium: 9.6 mg/dL (ref 8.4–10.5)
Potassium: 4.6 mEq/L (ref 3.5–5.3)
Sodium: 141 mEq/L (ref 135–145)

## 2012-03-02 ENCOUNTER — Encounter: Payer: Self-pay | Admitting: *Deleted

## 2012-03-14 ENCOUNTER — Other Ambulatory Visit: Payer: Self-pay | Admitting: Gastroenterology

## 2012-04-20 NOTE — Progress Notes (Signed)
Cardiac Rehabilitation Program Outcomes Report   Orientation:  03/21/2012 Halfway report: 05/27/2011 Graduate Date:  tbd Discharge Date:  tbd # of sessions completed: 91 DX: MI and STENT  Cardiologist: Dietrich Pates Family MD:  Betsy Pries Time:  09:30  A.  Exercise Program:  Tolerates exercise @ 2.2 METS for 15 minutes and Walk Test Results:  Pre: Did 6 minute Walk Test. Resting HR 74, BP 138/60, O2 100%, RPE 9, and RPD 9. 6 minute HR 84, BP 140/82 O2 100%, RPE 12, RPD 11, Post  HR 81, BP 122/72, O2 100%, RPE 9, and RPD 9. Walked 800 sq. Ft or 1.5 mph  B.  Mental Health:  Good mental attitude  C.  Education/Instruction/Skills  Accurately checks own pulse.  Rest:  74  Exercise:  91, Knows THR for exercise and Uses Perceived Exertion Scale and/or Dyspnea Scale  Uses Perceived Exertion Scale and/or Dyspnea Scale  D.  Nutrition/Weight Control/Body Composition:  Adherence to prescribed nutrition program: good    E.  Blood Lipids    Lab Results  Component Value Date   CHOL 166 06/01/2011   HDL 66 06/01/2011   LDLCALC 80 06/01/2011   TRIG 98 06/01/2011   CHOLHDL 2.5 06/01/2011    F.  Lifestyle Changes:  Making positive lifestyle changes  G.  Symptoms noted with exercise:  Asymptomatic  Report Completed By:  Lelon Huh. Fusaye Wachtel RN   Comments:  This is patients halfway report. She achieved a peak METS of 2.2. Her resting HR was 74 and BP was 142/60, Her peak HR was 91 and her peak BP was 140/80. A graduation report will follow upon her 36th visit.

## 2012-05-24 ENCOUNTER — Ambulatory Visit: Payer: Medicare Other | Admitting: Cardiology

## 2012-05-28 ENCOUNTER — Encounter: Payer: Self-pay | Admitting: Cardiology

## 2012-05-28 ENCOUNTER — Ambulatory Visit (INDEPENDENT_AMBULATORY_CARE_PROVIDER_SITE_OTHER): Payer: Medicare Other | Admitting: Cardiology

## 2012-05-28 VITALS — BP 120/64 | HR 72 | Ht 64.0 in | Wt 105.0 lb

## 2012-05-28 DIAGNOSIS — D638 Anemia in other chronic diseases classified elsewhere: Secondary | ICD-10-CM

## 2012-05-28 DIAGNOSIS — R7989 Other specified abnormal findings of blood chemistry: Secondary | ICD-10-CM

## 2012-05-28 DIAGNOSIS — I679 Cerebrovascular disease, unspecified: Secondary | ICD-10-CM

## 2012-05-28 DIAGNOSIS — I7090 Unspecified atherosclerosis: Secondary | ICD-10-CM

## 2012-05-28 DIAGNOSIS — A310 Pulmonary mycobacterial infection: Secondary | ICD-10-CM

## 2012-05-28 DIAGNOSIS — J479 Bronchiectasis, uncomplicated: Secondary | ICD-10-CM

## 2012-05-28 DIAGNOSIS — E785 Hyperlipidemia, unspecified: Secondary | ICD-10-CM

## 2012-05-28 DIAGNOSIS — R634 Abnormal weight loss: Secondary | ICD-10-CM

## 2012-05-28 DIAGNOSIS — R6889 Other general symptoms and signs: Secondary | ICD-10-CM

## 2012-05-28 DIAGNOSIS — I10 Essential (primary) hypertension: Secondary | ICD-10-CM

## 2012-05-28 DIAGNOSIS — N184 Chronic kidney disease, stage 4 (severe): Secondary | ICD-10-CM

## 2012-05-28 DIAGNOSIS — I2510 Atherosclerotic heart disease of native coronary artery without angina pectoris: Secondary | ICD-10-CM

## 2012-05-28 NOTE — Assessment & Plan Note (Signed)
Blood pressure control is excellent despite patient discontinuing a number of her medications. She will resume losartan but continue to hold amlodipine, collect blood pressure determinations at home or in local pharmacies and return for a blood pressure check in one month.

## 2012-05-28 NOTE — Assessment & Plan Note (Addendum)
Significant obstructive right carotid disease in the past, which is managed by Dr. Darrick Penna.

## 2012-05-28 NOTE — Assessment & Plan Note (Signed)
Lipid profile performed last year was excellent; current lipid lowering therapy will be continued.

## 2012-05-28 NOTE — Patient Instructions (Signed)
Your physician recommends that you schedule a follow-up appointment in:  1 - Nurse visit in 1 month 2 - Physician follow up in 6 months  Your physician has requested that you regularly monitor and record your blood pressure readings at home. Please use the same machine at the same time of day to check your readings and record them to bring to your follow-up visit.  Your physician recommends that you return for lab work in: Today  Your physician has recommended you make the following change in your medication:  1 - Resume Losartan 100 mg 1/2 tablet daily 2 - STOP Amlodipine

## 2012-05-28 NOTE — Progress Notes (Signed)
Patient ID: Kayla Lane, female   DOB: [birth year], 79 y.o.   MRN: [id number]  HPI: Scheduled return visit for this very nice woman with coronary artery disease and multiple cardiovascular risk factors. Since her previous visit, she has done extremely well. She has not followed blood pressures. She has not experienced any significant cardiopulmonary symptoms.  She discontinued amlodipine sometime ago and more recently losartan when her prescription was exhausted.  Current Outpatient Prescriptions  Medication Sig Dispense Refill  . aspirin EC 81 MG tablet Take 81 mg by mouth at bedtime.        . benzonatate (TESSALON) 100 MG capsule Take 100 mg by mouth 3 (three) times daily as needed. For cough      . bisoprolol-hydrochlorothiazide (ZIAC) 5-6.25 MG per tablet Take 2 tablets by mouth every morning.  180 tablet  3  . Cyanocobalamin (VITAMIN B-12 CR PO) Take 1 tablet by mouth daily.      . Cyanocobalamin (VITAMIN B-12 IJ) Inject as directed every 30 (thirty) days.      Marland Kitchen HYDROcodone-acetaminophen (NORCO) 5-325 MG per tablet Take 1 tablet by mouth every 6 (six) hours as needed. For pain       . nitroGLYCERIN (NITROSTAT) 0.4 MG SL tablet Place 1 tablet (0.4 mg total) under the tongue every 5 (five) minutes as needed.  25 tablet  3  . PREVACID 24HR 15 MG capsule TAKE ONE CAPSULE DAILY.  28 capsule  11  . Probiotic Product (ALIGN PO) Take by mouth daily.      Marland Kitchen ZOCOR 40 MG tablet TAKE ONE TABLET AT BEDTIME.  30 each  10   No current facility-administered medications for this visit.   Allergies  Allergen Reactions  . Iodinated Diagnostic Agents   . Penicillins     REACTION: Unknown reaction  . Sulfonamide Derivatives     REACTION: Unknown reaction  . Tetracycline     REACTION: Unknown reaction     Past medical history, social history, and family history reviewed and updated.  ROS: denies chest discomfort, dyspnea, orthopnea, PND or pedal edema.  PHYSICAL EXAM: BP 120/64  Pulse 72  Ht 5'  4" (1.626 m)  Wt 47.628 kg (105 lb)  BMI 18.01 kg/m2;  Body mass index is 18.01 kg/(m^2). General-Well developed; no acute distress Body habitus-proportionate weight and height Neck-No JVD; Right carotid bruits Lungs-cellophane-like inspiratory rales over the right upper chest; multiple sibilant inspiratory rhonchi; no prolongation of the expiratory phase; resonant to percussion Cardiovascular-normal PMI; normal S1 and normally split S2; modest systolic ejection murmur Abdomen-normal bowel sounds; soft and non-tender without masses or organomegaly Musculoskeletal-No deformities, no cyanosis or clubbing Neurologic-Normal cranial nerves; symmetric strength and tone Skin-Warm, no significant lesions Extremities-distal pulses intact; 1/2+ ankle edema  EKG: normal sinus rhythm; prominent T-wave voltage; slightly delayed R-wave progression; otherwise normal.  [place] Bing, MD 05/28/2012  1:57 PM  ASSESSMENT AND PLAN

## 2012-05-28 NOTE — Assessment & Plan Note (Signed)
Patient has abnormal physical findings on examination without much in way pulmonary symptoms. I presume that these reflect known bronchiectasis, a problem that is managed by Dr. Juanetta Gosling.

## 2012-05-28 NOTE — Assessment & Plan Note (Signed)
Slightly elevated TSH with normal free T3 and T4 18 months ago; no current symptoms of hypothyroidism.

## 2012-05-28 NOTE — Assessment & Plan Note (Addendum)
The patient has had no further apparent problems with coronary disease since small acute myocardial infarction 18 months ago. Risk factor management is optimized and will continue to be the focus of therapy.

## 2012-05-28 NOTE — Assessment & Plan Note (Signed)
CBC was reassessed last year and was normal. It appears that the anemia has resolved without a specific diagnosis being established.

## 2012-05-28 NOTE — Assessment & Plan Note (Signed)
30 pound weight loss in 2010; weight subsequently stable.

## 2012-05-28 NOTE — Progress Notes (Deleted)
Name: Kayla Lane    DOB: Nov 28, 1925  Age: 79 y.o.  MR#: [id number]       PCP:  Fredirick Maudlin, MD      Insurance: Payor: [employer] [place] [employer]  Plan: BLUE MEDICARE  Product Type: *No Product type*    CC:   No chief complaint on file.  MEDICATION LIST CUS 01/26/12 PER RUSSELL WELCH AND WILL BE DUE IN 1 YEAR  VS Filed Vitals:   05/28/12 1325  BP: 120/64  Pulse: 72  Height: 5\' 4"  (1.626 m)  Weight: 105 lb (47.628 kg)    Weights Current Weight  05/28/12 105 lb (47.628 kg)  01/26/12 101 lb (45.813 kg)  12/15/11 105 lb (47.628 kg)    Blood Pressure  BP Readings from Last 3 Encounters:  05/28/12 120/64  01/26/12 136/61  12/15/11 138/72     Admit date:  (Not on file) Last encounter with RMR:  05/24/2012   Allergy Iodinated diagnostic agents; Penicillins; Sulfonamide derivatives; and Tetracycline  Current Outpatient Prescriptions  Medication Sig Dispense Refill  . aspirin EC 81 MG tablet Take 81 mg by mouth at bedtime.        . benzonatate (TESSALON) 100 MG capsule Take 100 mg by mouth 3 (three) times daily as needed. For cough      . bisoprolol-hydrochlorothiazide (ZIAC) 5-6.25 MG per tablet Take 2 tablets by mouth every morning.  180 tablet  3  . Cyanocobalamin (VITAMIN B-12 CR PO) Take 1 tablet by mouth daily.      . Cyanocobalamin (VITAMIN B-12 IJ) Inject as directed every 30 (thirty) days.      Marland Kitchen HYDROcodone-acetaminophen (NORCO) 5-325 MG per tablet Take 1 tablet by mouth every 6 (six) hours as needed. For pain       . nitroGLYCERIN (NITROSTAT) 0.4 MG SL tablet Place 1 tablet (0.4 mg total) under the tongue every 5 (five) minutes as needed.  25 tablet  3  . PREVACID 24HR 15 MG capsule TAKE ONE CAPSULE DAILY.  28 capsule  11  . Probiotic Product (ALIGN PO) Take by mouth daily.      Marland Kitchen ZOCOR 40 MG tablet TAKE ONE TABLET AT BEDTIME.  30 each  10   No current facility-administered medications for this visit.    Discontinued Meds:    Medications  Discontinued During This Encounter  Medication Reason  . amLODipine (NORVASC) 5 MG tablet Error  . cholecalciferol (VITAMIN D) 1000 UNITS tablet Error  . gabapentin (NEURONTIN) 100 MG capsule Error    Patient Active Problem List  Diagnosis  . CEREBROVASCULAR DISEASE  . Hyperlipidemia  . Anemia, chronic disease  . Chronic kidney disease (CKD), stage III (moderate)  . Abnormal TSH  . Arteriosclerotic cardiovascular disease (ASCVD)  . Hypertension  . Gastroesophageal reflux disease  . Colon carcinoma  . Weight loss  . Mycobacterium avium-intracellulare infection  . Lung nodule  . Herpes zoster  . Occlusion and stenosis of carotid artery without mention of cerebral infarction  . Osteoarthritis  . Bronchiectasis    LABS @BMET3 @  @CMPRESULT3 @ @CBC3 @  Lipid Panel     Component Value Date/Time   CHOL 166 06/01/2011 0900   TRIG 98 06/01/2011 0900   HDL 66 06/01/2011 0900   CHOLHDL 2.5 06/01/2011 0900   VLDL 20 06/01/2011 0900   LDLCALC 80 06/01/2011 0900    ABG No results found for this basename: phart, pco2, pco2art, po2, po2art, hco3, tco2, acidbasedef, o2sat     BNP (last  3 results) No results found for this basename: PROBNP,  in the last 8760 hours Cardiac Panel (last 3 results) No results found for this basename: CKTOTAL, CKMB, TROPONINI, RELINDX,  in the last 72 hours  Iron/TIBC/Ferritin    Component Value Date/Time   IRON 48 01/09/2011 1619   TIBC 224* 01/09/2011 1619   FERRITIN 202 01/09/2011 1619     EKG Orders placed during the hospital encounter of 02/02/11  . EKG  . EKG  . EKG     Prior Assessment and Plan Problem List as of 05/28/2012     ICD-9-CM     Cardiology Problems   Hyperlipidemia   Last Assessment & Plan   11/25/2011 Office Visit Edited 11/30/2011 10:36 PM by Kathlen Brunswick, MD     Excellent control of hyperlipidemia with maximum permitted dose of simvastatin, which will be continued.    CEREBROVASCULAR DISEASE   Last Assessment & Plan    11/25/2011 Office Visit Written 11/25/2011  9:46 PM by Kathlen Brunswick, MD     Cerebrovascular disease remains a noncritical in this patient who has never experienced neurologic symptoms. Treatment with aspirin and periodic assessment by vascular surgery will be continued.    Arteriosclerotic cardiovascular disease (ASCVD)   Last Assessment & Plan   11/25/2011 Office Visit Edited 11/25/2011  9:55 PM by Kathlen Brunswick, MD     She is nearly one year out from a small myocardial infarction without recurrent symptoms and with excellent exercise tolerance. We will continue to work with her in an attempt to achieve optimal control of cardiovascular risk factors.  She has been treated with clopidogrel since placement of a bare-metal stent in 01/2011. A one-year course of therapy with that medication was planned. The drug will be discontinued at her next office visit.    Hypertension   Last Assessment & Plan   11/25/2011 Office Visit Written 11/25/2011  9:49 PM by Kathlen Brunswick, MD     Blood pressure has been somewhat elevated in recent months and gradually rising. Ziac dose will be doubled with continued monitoring of control of hypertension.  Dose of HCTZ he remains only 12.5 mg per day, but electrolytes and renal function will continue to be monitored.    Occlusion and stenosis of carotid artery without mention of cerebral infarction     Other   Anemia, chronic disease   Last Assessment & Plan   05/25/2011 Office Visit Written 05/25/2011 11:32 AM by Kathlen Brunswick, MD     CBC will be reassessed in one month.    Chronic kidney disease (CKD), stage III (moderate)   Last Assessment & Plan   11/25/2011 Office Visit Edited 11/25/2011  9:52 PM by Kathlen Brunswick, MD     No apparent significant renal disease with a recent creatinine in 05/2011 of 0.83 and values well less than 1.0 for at least the past 2 years.    Abnormal TSH   Gastroesophageal reflux disease   Colon carcinoma   Weight loss    Mycobacterium avium-intracellulare infection   Lung nodule   Herpes zoster   Last Assessment & Plan   11/25/2011 Office Visit Written 11/25/2011  3:18 PM by Kathlen Brunswick, MD     Patient has been improving, and hopefully pain will ultimately resolve completely.  I suggested she increase gabapentin to 100 mg t.i.d. And then 200 mg t.i.d. If ineffective.  She will reconsult Dr. Juanetta Gosling if pain persists.    Osteoarthritis  Bronchiectasis       Imaging: No results found.

## 2012-05-29 LAB — COMPREHENSIVE METABOLIC PANEL
ALT: <8 U/L (ref 0–35)
Albumin: 4.4 g/dL (ref 3.5–5.2)
CO2: 28 mEq/L (ref 19–32)
Chloride: 100 mEq/L (ref 96–112)
Glucose, Bld: 87 mg/dL (ref 70–99)
Potassium: 4.3 mEq/L (ref 3.5–5.3)
Sodium: 135 mEq/L (ref 135–145)
Total Protein: 6.6 g/dL (ref 6.0–8.3)

## 2012-05-31 ENCOUNTER — Telehealth: Payer: Self-pay | Admitting: Cardiology

## 2012-05-31 MED ORDER — BLOOD PRESSURE MONITOR/M CUFF MISC: 1.0000 | Freq: Once | Status: DC

## 2012-05-31 NOTE — Telephone Encounter (Signed)
Script sent and patient aware.

## 2012-05-31 NOTE — Telephone Encounter (Signed)
Pt was told to start taking log of Bp at home, she would like for [place] to give her a RX to buy a blood pressure cuff.

## 2012-06-01 ENCOUNTER — Encounter: Payer: Self-pay | Admitting: *Deleted

## 2012-06-01 ENCOUNTER — Ambulatory Visit: Payer: Medicare Other | Admitting: Internal Medicine

## 2012-06-25 ENCOUNTER — Encounter: Payer: Self-pay | Admitting: *Deleted

## 2012-06-25 ENCOUNTER — Encounter: Payer: Self-pay | Admitting: Cardiology

## 2012-06-25 ENCOUNTER — Ambulatory Visit (INDEPENDENT_AMBULATORY_CARE_PROVIDER_SITE_OTHER): Payer: Medicare Other | Admitting: *Deleted

## 2012-06-25 VITALS — BP 146/66 | HR 64 | Ht 62.0 in | Wt 106.8 lb

## 2012-06-25 DIAGNOSIS — I10 Essential (primary) hypertension: Secondary | ICD-10-CM

## 2012-06-25 NOTE — Patient Instructions (Addendum)
WE WILL CONTACT YOU WITH THE NEXT STEPS

## 2012-06-25 NOTE — Progress Notes (Signed)
Pt presented to office for BP check per recent medication changes, pt has no complaints, is compliment with medication regimen, pt brought in BP readings, pending scan into chart, noted average 130's/75    LAST OV VS/D/C SUMMARY:  120/64 72 5\' 4"  (1.626 m) 105 lb (47.628 kg) 18.01 kg/m2        Your physician recommends that you schedule a follow-up appointment in:  1 - Nurse visit in 1 month  2 - Physician follow up in 6 months  Your physician has requested that you regularly monitor and record your blood pressure readings at home. Please use the same machine at the same time of day to check your readings and record them to bring to your follow-up visit.  Your physician recommends that you return for lab work in: Today  Your physician has recommended you make the following change in your medication:  1 - Resume Losartan 100 mg 1/2 tablet daily  2 - STOP Amlodipine  PLEASE ADVISE IF ANY FURTHER STEPS NEEDED

## 2012-06-26 ENCOUNTER — Other Ambulatory Visit (HOSPITAL_COMMUNITY): Payer: Self-pay

## 2012-06-26 DIAGNOSIS — J441 Chronic obstructive pulmonary disease with (acute) exacerbation: Secondary | ICD-10-CM

## 2012-06-27 NOTE — Progress Notes (Signed)
Spoke to pt to advise results/instructions. Pt understood.  

## 2012-06-27 NOTE — Progress Notes (Signed)
Patient ID: Kayla Lane, female   DOB: 1925/10/04, 79 y.o.   MRN: [id number]  Blood pressure is well-controlled on patient's current reduced antihypertensive regimen. No modification is necessary.

## 2012-07-06 ENCOUNTER — Ambulatory Visit (INDEPENDENT_AMBULATORY_CARE_PROVIDER_SITE_OTHER): Payer: Medicare Other | Admitting: Internal Medicine

## 2012-07-06 ENCOUNTER — Encounter: Payer: Self-pay | Admitting: Internal Medicine

## 2012-07-06 VITALS — BP 157/73 | HR 69 | Temp 98.1°F | Ht 62.0 in | Wt 105.8 lb

## 2012-07-06 DIAGNOSIS — K219 Gastro-esophageal reflux disease without esophagitis: Secondary | ICD-10-CM

## 2012-07-06 NOTE — Progress Notes (Signed)
Primary Care Physician:  Fredirick Maudlin, MD Primary Gastroenterologist:  Dr. Jena Gauss  Pre-Procedure History & Physical: HPI:  Kayla Lane is a 79 y.o. female here for GERD. History of colon cancer status post segmental resection. Significant weight loss in the setting of a Mycobacterium avium intracellulare  Lung infection. Overall doing very well from a GI standpoint  - having 1 bowel movement daily. No blood per rectum. Taking Prevacid 30 mg orally daily. Had a spine injection 2 days ago and has had some pain down her left thigh recently but is better today. Weight is up a good pound and a half compared to weighing and Dr. Marvel Plan office in August of last year no dysphagia. Early satiety. She is accompanied by her daughter. Past Medical History  Diagnosis Date  . Arteriosclerotic cardiovascular disease (ASCVD) 1994     Stent to RCA and LAD at [facility] in 1994; neg. stress nuclear 12/98; 04/2005:40% LAD; 70% CX;  echocardiogram in 2009-normal EF; mild to moderate MR.; 01/2011  Non-ST segment elevation MI->  BMS to the distal LAD ,40% in-stent stenosis in proximal LAD, 90% proximal OM1, 40-50% in-stent RCA stenosis, 50% PDA  . Cerebrovascular disease     bilateral bruits; 7/08-50-69% right and left internal carotid artery stenosis;  2011: 70-80% right; 50% left   . Hypertension   . Hyperlipidemia   . Spinal stenosis   . Colitis 2011    2011  . Degenerative joint disease   . Gastroesophageal reflux disease 2002    Hiatal hernia; dysphasia due to cervical web-dilated in 2002  . Colon carcinoma 1996    left hemicolectomy  . Weight loss 2009    30 pounds 2009-2011  . Mycobacterium avium-intracellulare infection   . Lung nodule 01/2011    right lower lobe; re-image in 08/2011  . Pneumonia   . Myocardial infarction     Heart Attack  . Osteoarthritis 02/10/2012    Past Surgical History  Procedure Laterality Date  . Total abdominal hysterectomy    . Appendectomy    . Partial colectomy   1996    Left for carcinoma  . Kyphosis surgery      Percutaneous  . Tonsillectomy    . Colonoscopy  01/2009; 11/2009    Dr. Starsha Morning-->surgical anastomosis at 18cm. Residual rectal and colonic mucosa appeared normal; Dr. Marlowe Alt papilla, surgical anastomosis at 18cm  . Esophagogastroduodenoscopy  01/2009; 11/2009    Dr. Carron Curie cervical esophageal web s/p dilation, small hh; Dr. Ronni Rumble hh  . Coronary stent placement  02/02/11  . Bladder suspension    . Cataract extraction      Bilateral    Prior to Admission medications   Medication Sig Start Date End Date Taking? Authorizing Provider  aspirin EC 81 MG tablet Take 81 mg by mouth at bedtime.     Yes Historical Provider, MD  benzonatate (TESSALON) 100 MG capsule Take 100 mg by mouth 3 (three) times daily as needed. For cough   Yes Historical Provider, MD  bisoprolol-hydrochlorothiazide (ZIAC) 5-6.25 MG per tablet Take 2 tablets by mouth every morning. 11/25/11 11/24/12 Yes Kathlen Brunswick, MD  Cyanocobalamin (VITAMIN B-12 CR PO) Take 1 tablet by mouth daily.   Yes Historical Provider, MD  Cyanocobalamin (VITAMIN B-12 IJ) Inject as directed every 30 (thirty) days.   Yes Historical Provider, MD  HYDROcodone-acetaminophen (NORCO) 5-325 MG per tablet Take 1 tablet by mouth every 6 (six) hours as needed. For pain    Yes Historical Provider, MD  losartan (  COZAAR) 100 MG tablet Take 50 mg by mouth daily.   Yes Historical Provider, MD  nitroGLYCERIN (NITROSTAT) 0.4 MG SL tablet Place 1 tablet (0.4 mg total) under the tongue every 5 (five) minutes as needed. 02/21/11  Yes Jodelle Gross, NP  PREVACID 24HR 15 MG capsule TAKE ONE CAPSULE DAILY. 03/14/12  Yes Tiffany Kocher, PA-C  Probiotic Product (ALIGN PO) Take by mouth daily.   Yes Historical Provider, MD  ZOCOR 40 MG tablet TAKE ONE TABLET AT BEDTIME. 10/04/11  Yes Kathlen Brunswick, MD  Blood Pressure Monitoring (BLOOD PRESSURE MONITOR/M CUFF) MISC 1 Device by Does not apply  route once. 05/31/12   Kathlen Brunswick, MD  mupirocin ointment Idelle Jo) 2 %  05/10/12   Historical Provider, MD    Allergies as of 07/06/2012 - Review Complete 07/06/2012  Allergen Reaction Noted  . Iodinated diagnostic agents  05/25/2011  . Penicillins    . Sulfonamide derivatives    . Tetracycline      Family History  Problem Relation Age of Onset  . Diabetes type II Other   . Kidney cancer Brother   . Hypertension Mother   . Heart disease Mother   . Diabetes Sister   . Heart disease Father     History   Social History  . Marital Status: Widowed    Spouse Name: N/A    Number of Children: N/A  . Years of Education: N/A   Occupational History  . Retired    Social History Main Topics  . Smoking status: Never Smoker   . Smokeless tobacco: Never Used  . Alcohol Use: No  . Drug Use: No  . Sexually Active: No   Other Topics Concern  . Not on file   Social History Narrative  . No narrative on file    Review of Systems: See HPI, otherwise negative ROS  Physical Exam: BP 157/73  Pulse 69  Temp(Src) 98.1 F (36.7 C) (Oral)  Ht 5\' 2"  (1.575 m)  Wt 105 lb 12.8 oz (47.991 kg)  BMI 19.35 kg/m2 General:   Frail elderly appearing lady accompanied by her daughter. She is pleasant and in no acute distress. Skin:  Intact without significant lesions or rashes. Eyes:  Sclera clear, no icterus.   Conjunctiva pink. Ears:  Normal auditory acuity. Nose:  No deformity, discharge,  or lesions. Mouth:  No deformity or lesions. Neck:  Supple; no masses or thyromegaly. No significant cervical adenopathy. Lungs:  Clear throughout to auscultation.   No wheezes, crackles, or rhonchi. No acute distress. Heart:  Regular rate and rhythm; no murmurs, clicks, rubs,  or gallops. Abdomen: Flat. Positive bowel sounds soft and nontender without appreciable mass or organomegaly  Pulses:  Normal pulses noted. Extremities:  Without clubbing or edema.  Impression/Plan:  Pleasant  79 year old lady with history of colon cancer status post segmental resection of her colon back in 1996 without evidence of recurrence on serial colonoscopies. History of GERD well-controlled on Prevacid.  She would be 79 years old when her next colonoscopy would be due. I've told her daughter that I did not feel the benefits would not be  worth the risks at her age and given her other morbidities. I do recommend she continue Prevacid 30 mg orally daily as the benefits of this approach would out weigh the theoretical risks. Plan for an office followup in one year.

## 2012-07-06 NOTE — Patient Instructions (Addendum)
Stay on Prevacid 30 mg daily  GERD information  See Dr. Juanetta Gosling about left thigh pain  Office visit in 1 year  CC: PCP

## 2012-07-10 ENCOUNTER — Ambulatory Visit (HOSPITAL_COMMUNITY)
Admission: RE | Admit: 2012-07-10 | Discharge: 2012-07-10 | Disposition: A | Discharge status: Home / Self Care | Payer: Medicare Other | Source: Ambulatory Visit | Attending: Pulmonary Disease | Admitting: Pulmonary Disease

## 2012-07-10 ENCOUNTER — Other Ambulatory Visit (HOSPITAL_COMMUNITY): Payer: Self-pay | Admitting: Pulmonary Disease

## 2012-07-10 DIAGNOSIS — J449 Chronic obstructive pulmonary disease, unspecified: Secondary | ICD-10-CM

## 2012-07-10 DIAGNOSIS — J4489 Other specified chronic obstructive pulmonary disease: Secondary | ICD-10-CM | POA: Insufficient documentation

## 2012-07-10 DIAGNOSIS — R0609 Other forms of dyspnea: Secondary | ICD-10-CM | POA: Insufficient documentation

## 2012-07-10 DIAGNOSIS — R0989 Other specified symptoms and signs involving the circulatory and respiratory systems: Secondary | ICD-10-CM | POA: Insufficient documentation

## 2012-07-10 MED ORDER — ALBUTEROL SULFATE (5 MG/ML) 0.5% IN NEBU
2.5000 mg | INHALATION_SOLUTION | Freq: Once | RESPIRATORY_TRACT | Status: AC
  Administered 2012-07-10: 2.5 mg via RESPIRATORY_TRACT

## 2012-07-13 NOTE — Procedures (Signed)
NAMEJOLEENE, BURNHAM NO.:  [id number]  MEDICAL RECORD NO.:  [id number]  LOCATION:                                 FACILITY:  PHYSICIAN:  Chavie Kolinski L. Juanetta Gosling, M.D.DATE OF BIRTH:  1925/11/30  DATE OF PROCEDURE:  07/11/2012 DATE OF DISCHARGE:                           PULMONARY FUNCTION TEST   REASON FOR PULMONARY FUNCTION TESTING:  COPD.  1. Spirometry shows a mild ventilatory defect with airflow     obstruction. 2. Lung volumes are normal. 3. DLCO is moderately reduced and corrects somewhat with ventilation     is taken into account. 4. Airway resistance is high confirming the presence of airflow     obstruction. 5. There is no significant bronchodilator improvement.     Harvis Mabus L. Juanetta Gosling, M.D.     ELH/MEDQ  D:  07/11/2012  T:  07/12/2012  Job:  [id number]

## 2012-07-18 NOTE — Procedures (Signed)
Kayla Lane, Kayla Lane NO.:  [id number]  MEDICAL RECORD NO.:  [id number]  LOCATION:                                 FACILITY:  PHYSICIAN:  Osei Anger L. Juanetta Gosling, M.D.DATE OF BIRTH:  Apr 05, 1926  DATE OF PROCEDURE:  07/16/2012 DATE OF DISCHARGE:                           PULMONARY FUNCTION TEST   Reason for pulmonary function testing is COPD. 1. Spirometry shows a mild ventilatory defect with evidence of airflow     obstruction. 2. Lung volumes are normal. 3. DLCO is moderately reduced and corrects somewhat when ventilation     is taken into account. 4. Airway resistance is elevated confirming the presence of airflow     obstruction. 5. There is no significant bronchodilator improved. 6. This study is consistent with the clinical diagnosis of COPD.     Mattias Walmsley L. Juanetta Gosling, M.D.     ELH/MEDQ  D:  07/16/2012  T:  07/17/2012  Job:  [id number]

## 2012-07-24 LAB — PULMONARY FUNCTION TEST

## 2012-09-10 ENCOUNTER — Encounter (HOSPITAL_COMMUNITY): Payer: Self-pay | Admitting: *Deleted

## 2012-09-10 ENCOUNTER — Emergency Department (HOSPITAL_COMMUNITY)
Admission: EM | Admit: 2012-09-10 | Discharge: 2012-09-11 | Disposition: A | Discharge status: Home / Self Care | Payer: Medicare Other | Attending: Emergency Medicine | Admitting: Emergency Medicine

## 2012-09-10 DIAGNOSIS — Z8701 Personal history of pneumonia (recurrent): Secondary | ICD-10-CM | POA: Insufficient documentation

## 2012-09-10 DIAGNOSIS — Y93H2 Activity, gardening and landscaping: Secondary | ICD-10-CM | POA: Insufficient documentation

## 2012-09-10 DIAGNOSIS — Z8673 Personal history of transient ischemic attack (TIA), and cerebral infarction without residual deficits: Secondary | ICD-10-CM | POA: Insufficient documentation

## 2012-09-10 DIAGNOSIS — M1990 Unspecified osteoarthritis, unspecified site: Secondary | ICD-10-CM | POA: Insufficient documentation

## 2012-09-10 DIAGNOSIS — I252 Old myocardial infarction: Secondary | ICD-10-CM | POA: Insufficient documentation

## 2012-09-10 DIAGNOSIS — Z8709 Personal history of other diseases of the respiratory system: Secondary | ICD-10-CM | POA: Insufficient documentation

## 2012-09-10 DIAGNOSIS — Z8739 Personal history of other diseases of the musculoskeletal system and connective tissue: Secondary | ICD-10-CM | POA: Insufficient documentation

## 2012-09-10 DIAGNOSIS — I10 Essential (primary) hypertension: Secondary | ICD-10-CM | POA: Insufficient documentation

## 2012-09-10 DIAGNOSIS — Z8679 Personal history of other diseases of the circulatory system: Secondary | ICD-10-CM | POA: Insufficient documentation

## 2012-09-10 DIAGNOSIS — Y92009 Unspecified place in unspecified non-institutional (private) residence as the place of occurrence of the external cause: Secondary | ICD-10-CM | POA: Insufficient documentation

## 2012-09-10 DIAGNOSIS — Z880 Allergy status to penicillin: Secondary | ICD-10-CM | POA: Insufficient documentation

## 2012-09-10 DIAGNOSIS — M545 Low back pain: Secondary | ICD-10-CM

## 2012-09-10 DIAGNOSIS — X58XXXA Exposure to other specified factors, initial encounter: Secondary | ICD-10-CM | POA: Insufficient documentation

## 2012-09-10 DIAGNOSIS — Z8719 Personal history of other diseases of the digestive system: Secondary | ICD-10-CM | POA: Insufficient documentation

## 2012-09-10 DIAGNOSIS — Z862 Personal history of diseases of the blood and blood-forming organs and certain disorders involving the immune mechanism: Secondary | ICD-10-CM | POA: Insufficient documentation

## 2012-09-10 DIAGNOSIS — IMO0002 Reserved for concepts with insufficient information to code with codable children: Secondary | ICD-10-CM | POA: Insufficient documentation

## 2012-09-10 DIAGNOSIS — Z8674 Personal history of sudden cardiac arrest: Secondary | ICD-10-CM | POA: Insufficient documentation

## 2012-09-10 DIAGNOSIS — Z7982 Long term (current) use of aspirin: Secondary | ICD-10-CM | POA: Insufficient documentation

## 2012-09-10 DIAGNOSIS — Z8639 Personal history of other endocrine, nutritional and metabolic disease: Secondary | ICD-10-CM | POA: Insufficient documentation

## 2012-09-10 DIAGNOSIS — Z85038 Personal history of other malignant neoplasm of large intestine: Secondary | ICD-10-CM | POA: Insufficient documentation

## 2012-09-10 DIAGNOSIS — Z8619 Personal history of other infectious and parasitic diseases: Secondary | ICD-10-CM | POA: Insufficient documentation

## 2012-09-10 DIAGNOSIS — Z79899 Other long term (current) drug therapy: Secondary | ICD-10-CM | POA: Insufficient documentation

## 2012-09-10 HISTORY — DX: Dorsalgia, unspecified: M54.9

## 2012-09-10 NOTE — ED Notes (Signed)
Patient wearing a flector patch on her lower back

## 2012-09-11 MED ORDER — ONDANSETRON 8 MG PO TBDP
8.0000 mg | ORAL_TABLET | Freq: Once | ORAL | Status: AC
  Administered 2012-09-11: 8 mg via ORAL
  Filled 2012-09-11: qty 1

## 2012-09-11 MED ORDER — HYDROMORPHONE HCL PF 1 MG/ML IJ SOLN
0.5000 mg | Freq: Once | INTRAMUSCULAR | Status: AC
  Administered 2012-09-11: 0.5 mg via INTRAVENOUS
  Filled 2012-09-11: qty 1

## 2012-09-11 NOTE — ED Provider Notes (Signed)
History     CSN: [id number]  Arrival date & time 09/10/12  2355   First MD Initiated Contact with Patient 09/11/12 0243      Chief Complaint  Patient presents with  . Back Pain    back pain after mowing grass while riding a lawnmover sat.    (Consider location/radiation/quality/duration/timing/severity/associated sxs/prior treatment) HPI HPI Comments: Kayla Lane is a 79 y.o. female with a h/o ASCVD, spinal stenosis, GERD, HTN who presents to the Emergency Department complaining of back pain that has been worse since mowing her lawn. She is on hydrocodone 3 times a day normally which did not help the back pain. She has trouble walking and bending over.   PCP Dr. Juanetta Gosling  Past Medical History  Diagnosis Date  . Arteriosclerotic cardiovascular disease (ASCVD) 1994     Stent to RCA and LAD at [facility] in 1994; neg. stress nuclear 12/98; 04/2005:40% LAD; 70% CX;  echocardiogram in 2009-normal EF; mild to moderate MR.; 01/2011  Non-ST segment elevation MI->  BMS to the distal LAD ,40% in-stent stenosis in proximal LAD, 90% proximal OM1, 40-50% in-stent RCA stenosis, 50% PDA  . Cerebrovascular disease     bilateral bruits; 7/08-50-69% right and left internal carotid artery stenosis;  2011: 70-80% right; 50% left   . Hypertension   . Hyperlipidemia   . Spinal stenosis   . Colitis 2011    2011  . Degenerative joint disease   . Gastroesophageal reflux disease 2002    Hiatal hernia; dysphasia due to cervical web-dilated in 2002  . Colon carcinoma 1996    left hemicolectomy  . Weight loss 2009    30 pounds 2009-2011  . Mycobacterium avium-intracellulare infection   . Lung nodule 01/2011    right lower lobe; re-image in 08/2011  . Pneumonia   . Myocardial infarction     Heart Attack  . Osteoarthritis 02/10/2012  . Back pain     Past Surgical History  Procedure Laterality Date  . Total abdominal hysterectomy    . Appendectomy    . Partial colectomy  1996    Left for carcinoma  .  Kyphosis surgery      Percutaneous  . Tonsillectomy    . Colonoscopy  01/2009; 11/2009    Dr. Rourk-->surgical anastomosis at 18cm. Residual rectal and colonic mucosa appeared normal; Dr. Marlowe Alt papilla, surgical anastomosis at 18cm  . Esophagogastroduodenoscopy  01/2009; 11/2009    Dr. Carron Curie cervical esophageal web s/p dilation, small hh; Dr. Ronni Rumble hh  . Coronary stent placement  02/02/11  . Bladder suspension    . Cataract extraction      Bilateral    Family History  Problem Relation Age of Onset  . Diabetes type II Other   . Kidney cancer Brother   . Hypertension Mother   . Heart disease Mother   . Diabetes Sister   . Heart disease Father     History  Substance Use Topics  . Smoking status: Never Smoker   . Smokeless tobacco: Never Used  . Alcohol Use: No    OB History   Grav Para Term Preterm Abortions TAB SAB Ect Mult Living   2 2 2       2       Review of Systems  Constitutional: Negative for fever.       10 Systems reviewed and are negative for acute change except as noted in the HPI.  HENT: Negative for congestion.   Eyes: Negative for discharge and redness.  Respiratory: Negative for cough and shortness of breath.   Cardiovascular: Negative for chest pain.  Gastrointestinal: Negative for vomiting and abdominal pain.  Musculoskeletal: Positive for back pain.  Skin: Negative for rash.  Neurological: Negative for syncope, numbness and headaches.  Psychiatric/Behavioral:       No behavior change.    Allergies  Iodinated diagnostic agents; Penicillins; Sulfonamide derivatives; and Tetracycline  Home Medications   Current Outpatient Rx  Name  Route  Sig  Dispense  Refill  . aspirin EC 81 MG tablet   Oral   Take 81 mg by mouth at bedtime.           . benzonatate (TESSALON) 100 MG capsule   Oral   Take 100 mg by mouth 3 (three) times daily as needed. For cough         . bisoprolol-hydrochlorothiazide (ZIAC) 5-6.25 MG per  tablet   Oral   Take 2 tablets by mouth every morning.   180 tablet   3   . Blood Pressure Monitoring (BLOOD PRESSURE MONITOR/M CUFF) MISC   Does not apply   1 Device by Does not apply route once.   1 each   0   . Cyanocobalamin (VITAMIN B-12 CR PO)   Oral   Take 1 tablet by mouth daily.         . Cyanocobalamin (VITAMIN B-12 IJ)   Injection   Inject as directed every 30 (thirty) days.         Marland Kitchen HYDROcodone-acetaminophen (NORCO) 5-325 MG per tablet   Oral   Take 1 tablet by mouth every 6 (six) hours as needed. For pain          . losartan (COZAAR) 100 MG tablet   Oral   Take 50 mg by mouth daily.         . mupirocin ointment (BACTROBAN) 2 %               . nitroGLYCERIN (NITROSTAT) 0.4 MG SL tablet   Sublingual   Place 1 tablet (0.4 mg total) under the tongue every 5 (five) minutes as needed.   25 tablet   3   . PREVACID 24HR 15 MG capsule      TAKE ONE CAPSULE DAILY.   28 capsule   11   . Probiotic Product (ALIGN PO)   Oral   Take by mouth daily.         Marland Kitchen ZOCOR 40 MG tablet      TAKE ONE TABLET AT BEDTIME.   30 each   10     BP 165/78  Pulse 80  Temp(Src) 98.3 F (36.8 C) (Oral)  Resp 18  SpO2 97%  Physical Exam  Nursing note and vitals reviewed. Constitutional: She appears well-developed and well-nourished.  Awake, alert, nontoxic appearance.  HENT:  Head: Normocephalic and atraumatic.  Eyes: EOM are normal. Pupils are equal, round, and reactive to light. Right eye exhibits no discharge. Left eye exhibits no discharge.  Neck: Normal range of motion. Neck supple.  Cardiovascular: Normal rate and intact distal pulses.   Pulmonary/Chest: Effort normal and breath sounds normal. She exhibits no tenderness.  Abdominal: Soft. Bowel sounds are normal. There is no tenderness. There is no rebound.  Musculoskeletal: She exhibits no tenderness.  Baseline ROM, no obvious new focal weakness.Pai with palpation to lumbar paraspinal muscles.    Neurological:  Mental status and motor strength appears baseline for patient and situation.  Skin: No rash noted.  Psychiatric: She  has a normal mood and affect.    ED Course  Procedures (including critical care time)  Medications  HYDROmorphone (DILAUDID) injection 0.5 mg (0.5 mg Intravenous Given 09/11/12 0346)  ondansetron (ZOFRAN-ODT) disintegrating tablet 8 mg (8 mg Oral Given 09/11/12 0346)    0431 Patient is able to walk now having received the dilaudid. She feels better. She ambulated in the department. MDM  Patient presents with worsening back pain after mowing her yard. PE reveals discomfort with palpation across the paralumbar muscles. Given dilaudid and zofran with marked relief. Pt stable in ED with no significant deterioration in condition.The patient appears reasonably screened and/or stabilized for discharge and I doubt any other medical condition or other [facility] requiring further screening, evaluation, or treatment in the ED at this time prior to discharge.  MDM Reviewed: nursing note and vitals           Nicoletta Dress. Colon Branch, MD 09/11/12 [phone]

## 2012-09-21 ENCOUNTER — Other Ambulatory Visit: Payer: Self-pay | Admitting: Orthopaedic Surgery

## 2012-09-21 DIAGNOSIS — M545 Low back pain: Secondary | ICD-10-CM

## 2012-09-22 ENCOUNTER — Other Ambulatory Visit: Payer: Medicare Other

## 2012-09-27 ENCOUNTER — Other Ambulatory Visit: Payer: Self-pay | Admitting: Cardiology

## 2012-09-29 ENCOUNTER — Ambulatory Visit
Admission: RE | Admit: 2012-09-29 | Discharge: 2012-09-29 | Disposition: A | Discharge status: Home / Self Care | Payer: Medicare Other | Source: Ambulatory Visit | Attending: Orthopaedic Surgery | Admitting: Orthopaedic Surgery

## 2012-09-29 DIAGNOSIS — M545 Low back pain: Secondary | ICD-10-CM

## 2012-10-04 DIAGNOSIS — M549 Dorsalgia, unspecified: Secondary | ICD-10-CM

## 2012-10-04 HISTORY — PX: FRACTURE SURGERY: SHX138

## 2012-10-04 HISTORY — DX: Dorsalgia, unspecified: M54.9

## 2012-12-05 ENCOUNTER — Encounter: Payer: Self-pay | Admitting: Cardiology

## 2012-12-05 ENCOUNTER — Ambulatory Visit (INDEPENDENT_AMBULATORY_CARE_PROVIDER_SITE_OTHER): Payer: Medicare Other | Admitting: Cardiology

## 2012-12-05 VITALS — BP 140/64 | HR 67 | Ht 62.0 in | Wt 106.0 lb

## 2012-12-05 DIAGNOSIS — R911 Solitary pulmonary nodule: Secondary | ICD-10-CM

## 2012-12-05 DIAGNOSIS — I10 Essential (primary) hypertension: Secondary | ICD-10-CM

## 2012-12-05 DIAGNOSIS — I7090 Unspecified atherosclerosis: Secondary | ICD-10-CM

## 2012-12-05 DIAGNOSIS — I2510 Atherosclerotic heart disease of native coronary artery without angina pectoris: Secondary | ICD-10-CM

## 2012-12-05 DIAGNOSIS — J479 Bronchiectasis, uncomplicated: Secondary | ICD-10-CM

## 2012-12-05 MED ORDER — DOXYCYCLINE HYCLATE 100 MG PO CAPS: 100.0000 mg | ORAL_CAPSULE | Freq: Two times a day (BID) | ORAL | Status: DC

## 2012-12-05 MED ORDER — CIPROFLOXACIN HCL 250 MG PO TABS: 250.0000 mg | ORAL_TABLET | Freq: Two times a day (BID) | ORAL | Status: DC

## 2012-12-05 MED ORDER — DILTIAZEM HCL ER COATED BEADS 180 MG PO CP24: 180.0000 mg | ORAL_CAPSULE | Freq: Every day | ORAL | Status: DC

## 2012-12-05 MED ORDER — HYDROCHLOROTHIAZIDE 12.5 MG PO CAPS: 12.5000 mg | ORAL_CAPSULE | Freq: Every day | ORAL | Status: DC

## 2012-12-05 MED ORDER — CIPROFLOXACIN HCL 500 MG PO TABS: 500.0000 mg | ORAL_TABLET | Freq: Two times a day (BID) | ORAL | Status: DC

## 2012-12-05 NOTE — Assessment & Plan Note (Signed)
The most recent Chest CT I can identify was performed in 2011.  In light of her multiple parenchymal abnormalities, Dr. Juanetta Gosling may wish to repeat that study.

## 2012-12-05 NOTE — Assessment & Plan Note (Addendum)
Patient reports increased symptoms, which have previously been treated with course of antibiotics. Although her chart lists an unknown reaction to tetracycline, patient denies this and notes that she has been treated with similar medications in the past. I recommended a course of doxycycline, but patient preferred ciprofloxacin, which was provided to her.  She describes possible bronchospasm exacerbating her chronic lung disease. Treatment with beta blocker will be discontinued to determine if this provides any benefit with respect to pulmonary symptoms.

## 2012-12-05 NOTE — Progress Notes (Deleted)
Name: Kayla Lane    DOB: [birth year]  Age: 79 y.o.  MR#: [id number]       PCP:  Fredirick Maudlin, MD      Insurance: Payor: [employer] / Plan: BLUE MEDICARE / Product Type: *No Product type* /   CC:   No chief complaint on file. LIST  VS Filed Vitals:   12/05/12 1339  BP: 140/64  Pulse: 67  Height: 5\' 2"  (1.575 m)  Weight: 106 lb (48.081 kg)    Weights Current Weight  12/05/12 106 lb (48.081 kg)  07/06/12 105 lb 12.8 oz (47.991 kg)  06/25/12 106 lb 12 oz (48.421 kg)    Blood Pressure  BP Readings from Last 3 Encounters:  12/05/12 140/64  09/10/12 165/78  07/06/12 157/73     Admit date:  (Not on file) Last encounter with RMR:  09/27/2012   Allergy Iodinated diagnostic agents; Penicillins; Sulfonamide derivatives; and Tetracycline  Current Outpatient Prescriptions  Medication Sig Dispense Refill  . aspirin EC 81 MG tablet Take 81 mg by mouth at bedtime.        . benzonatate (TESSALON) 100 MG capsule Take 100 mg by mouth 3 (three) times daily as needed. For cough      . bisoprolol-hydrochlorothiazide (ZIAC) 5-6.25 MG per tablet Take 1 tablet by mouth daily.      . Cyanocobalamin (VITAMIN B-12 CR PO) Take 1 tablet by mouth daily.      . Cyanocobalamin (VITAMIN B-12 IJ) Inject as directed every 30 (thirty) days.      Marland Kitchen HYDROcodone-acetaminophen (NORCO) 5-325 MG per tablet Take 1 tablet by mouth every 6 (six) hours as needed. For pain       . losartan (COZAAR) 100 MG tablet Take 50 mg by mouth daily.      . mupirocin ointment (BACTROBAN) 2 %       . nitroGLYCERIN (NITROSTAT) 0.4 MG SL tablet Place 1 tablet (0.4 mg total) under the tongue every 5 (five) minutes as needed.  25 tablet  3  . PREVACID 24HR 15 MG capsule TAKE ONE CAPSULE DAILY.  28 capsule  11  . Probiotic Product (ALIGN PO) Take by mouth daily.      . simvastatin (ZOCOR) 40 MG tablet TAKE ONE TABLET AT BEDTIME.  30 tablet  6   No current facility-administered medications for this  visit.    Discontinued Meds:    Medications Discontinued During This Encounter  Medication Reason  . Blood Pressure Monitoring (BLOOD PRESSURE MONITOR/M CUFF) MISC Error    Patient Active Problem List   Diagnosis Date Noted  . Osteoarthritis 02/10/2012  . Bronchiectasis 02/10/2012  . Herpes zoster 11/25/2011  . Arteriosclerotic cardiovascular disease (ASCVD)   . Hypertension   . Gastroesophageal reflux disease   . Colon carcinoma   . Weight loss   . Mycobacterium avium-intracellulare infection   . Subclinical hypothyroidism 02/01/2011  . Anemia, chronic disease 01/31/2011  . Chronic kidney disease (CKD), stage III (moderate) 01/31/2011  . Hyperlipidemia 01/30/2011  . Lung nodule 01/10/2011  . CEREBROVASCULAR DISEASE 12/25/2009    LABS    Component Value Date/Time   NA 135 05/28/2012 1400   NA 141 02/13/2012 1625   NA 139 12/15/2011 1639   K 4.3 05/28/2012 1400   K 4.6 02/13/2012 1625   K 4.4 12/15/2011 1639   CL 100 05/28/2012 1400   CL 104 02/13/2012 1625   CL 104 12/15/2011 1639   CO2 28 05/28/2012 1400  CO2 27 02/13/2012 1625   CO2 25 12/15/2011 1639   GLUCOSE 87 05/28/2012 1400   GLUCOSE 81 02/13/2012 1625   GLUCOSE 84 12/15/2011 1639   BUN 13 05/28/2012 1400   BUN 15 02/13/2012 1625   BUN 16 12/15/2011 1639   CREATININE 0.77 05/28/2012 1400   CREATININE 0.69 02/13/2012 1625   CREATININE 0.89 12/15/2011 1639   CREATININE 0.76 02/07/2011 0715   CREATININE 0.77 02/06/2011 0525   CREATININE 0.70 02/05/2011 1353   CALCIUM 9.7 05/28/2012 1400   CALCIUM 9.6 02/13/2012 1625   CALCIUM 9.9 12/15/2011 1639   GFRNONAA 76* 02/07/2011 0715   GFRNONAA 76* 02/06/2011 0525   GFRNONAA 78* 02/05/2011 1353   GFRAA 88* 02/07/2011 0715   GFRAA 88* 02/06/2011 0525   GFRAA >90 02/05/2011 1353   CMP     Component Value Date/Time   NA 135 05/28/2012 1400   K 4.3 05/28/2012 1400   CL 100 05/28/2012 1400   CO2 28 05/28/2012 1400   GLUCOSE 87 05/28/2012 1400   BUN 13 05/28/2012 1400   CREATININE 0.77  05/28/2012 1400   CREATININE 0.76 02/07/2011 0715   CALCIUM 9.7 05/28/2012 1400   PROT 6.6 05/28/2012 1400   ALBUMIN 4.4 05/28/2012 1400   AST 20 05/28/2012 1400   ALT <8 05/28/2012 1400   ALKPHOS 86 05/28/2012 1400   BILITOT 0.5 05/28/2012 1400   GFRNONAA 76* 02/07/2011 0715   GFRAA 88* 02/07/2011 0715       Component Value Date/Time   WBC 6.9 06/01/2011 0900   WBC 5.6 02/07/2011 0715   WBC 6.6 02/06/2011 0525   HGB 12.9 06/01/2011 0900   HGB 9.6* 02/07/2011 0715   HGB 9.2* 02/06/2011 0525   HCT 41.4 06/01/2011 0900   HCT 28.7* 02/07/2011 0715   HCT 27.0* 02/06/2011 0525   MCV 96.3 06/01/2011 0900   MCV 96.3 02/07/2011 0715   MCV 94.4 02/06/2011 0525    Lipid Panel     Component Value Date/Time   CHOL 166 06/01/2011 0900   TRIG 98 06/01/2011 0900   HDL 66 06/01/2011 0900   CHOLHDL 2.5 06/01/2011 0900   VLDL 20 06/01/2011 0900   LDLCALC 80 06/01/2011 0900    ABG No results found for this basename: phart, pco2, pco2art, po2, po2art, hco3, tco2, acidbasedef, o2sat     Lab Results  Component Value Date   TSH 5.756* 01/30/2011   BNP (last 3 results) No results found for this basename: PROBNP,  in the last 8760 hours Cardiac Panel (last 3 results) No results found for this basename: CKTOTAL, CKMB, TROPONINI, RELINDX,  in the last 72 hours  Iron/TIBC/Ferritin    Component Value Date/Time   IRON 48 01/09/2011 1619   TIBC 224* 01/09/2011 1619   FERRITIN 202 01/09/2011 1619     EKG Orders placed in visit on 12/05/12  . EKG 12-LEAD     Prior Assessment and Plan Problem List as of 12/05/2012   CEREBROVASCULAR DISEASE   Last Assessment & Plan   05/28/2012 Office Visit Edited 05/31/2012  6:12 PM by Kathlen Brunswick, MD     Significant obstructive right carotid disease in the past, which is managed by Dr. Darrick Penna.    Hyperlipidemia   Last Assessment & Plan   05/28/2012 Office Visit Written 05/28/2012  3:03 PM by Kathlen Brunswick, MD     Lipid profile performed last year was  excellent; current lipid lowering therapy will be continued.    Anemia, chronic disease  Last Assessment & Plan   05/28/2012 Office Visit Written 05/28/2012  3:00 PM by Kathlen Brunswick, MD     CBC was reassessed last year and was normal. It appears that the anemia has resolved without a specific diagnosis being established.    Chronic kidney disease (CKD), stage III (moderate)   Last Assessment & Plan   11/25/2011 Office Visit Edited 11/25/2011  9:52 PM by Kathlen Brunswick, MD     No apparent significant renal disease with a recent creatinine in 05/2011 of 0.83 and values well less than 1.0 for at least the past 2 years.    Subclinical hypothyroidism   Last Assessment & Plan   05/28/2012 Office Visit Written 05/28/2012  2:05 PM by Kathlen Brunswick, MD     Slightly elevated TSH with normal free T3 and T4 18 months ago; no current symptoms of hypothyroidism.    Arteriosclerotic cardiovascular disease (ASCVD)   Last Assessment & Plan   05/28/2012 Office Visit Edited 05/28/2012  3:01 PM by Kathlen Brunswick, MD     The patient has had no further apparent problems with coronary disease since small acute myocardial infarction 18 months ago. Risk factor management is optimized and will continue to be the focus of therapy.    Hypertension   Last Assessment & Plan   05/28/2012 Office Visit Written 05/28/2012  3:04 PM by Kathlen Brunswick, MD     Blood pressure control is excellent despite patient discontinuing a number of her medications. She will resume losartan but continue to hold amlodipine, collect blood pressure determinations at home or in local pharmacies and return for a blood pressure check in one month.    Gastroesophageal reflux disease   Colon carcinoma   Weight loss   Last Assessment & Plan   05/28/2012 Office Visit Written 05/28/2012  2:07 PM by Kathlen Brunswick, MD     30 pound weight loss in 2010; weight subsequently stable.    Mycobacterium avium-intracellulare infection   Lung  nodule   Herpes zoster   Last Assessment & Plan   11/25/2011 Office Visit Written 11/25/2011  3:18 PM by Kathlen Brunswick, MD     Patient has been improving, and hopefully pain will ultimately resolve completely.  I suggested she increase gabapentin to 100 mg t.i.d. And then 200 mg t.i.d. If ineffective.  She will reconsult Dr. Juanetta Gosling if pain persists.    Osteoarthritis   Bronchiectasis   Last Assessment & Plan   05/28/2012 Office Visit Written 05/28/2012  3:02 PM by Kathlen Brunswick, MD     Patient has abnormal physical findings on examination without much in way pulmonary symptoms. I presume that these reflect known bronchiectasis, a problem that is managed by Dr. Juanetta Gosling.        Imaging: No results found.

## 2012-12-05 NOTE — Patient Instructions (Addendum)
Your physician recommends that you schedule a follow-up appointment in: 4 MONTHS WITH MD/NP AND ONE MONTH FOR A NURSE VISIT TO RE-CHECK BLOOD PRESSURE  Your physician has requested that you regularly monitor and record your blood pressure readings at home. Please use the same machine at the same time of day to check your readings and record them to bring to your follow-up visit.BRING RECORDS WITH YOU AT THE NEXT OFFICE VISIT  Your physician has recommended you make the following change in your medication:   1) START TAKING CIPRO 500MG  TWICE DAILY FOR ONE WEEK 2) STOP TAKING ZIAC 3) START TAKING HCTZ (HYDROCHLORATHIZIDE) 12.5MG  ONCE DAILY 4) START TAKING DILTIAZEM 180MG  ONCE DAILY

## 2012-12-05 NOTE — Progress Notes (Signed)
Patient ID: Kayla Lane, female   DOB: 12/16/1925, 79 y.o.   MRN: [id number]  HPI: Scheduled return visit for this lovely woman with a history of MAI infection and bronchiectasis. With modification of her medical regime, respiratory symptoms initially improved. Over the past few days, she has noted increased cough with scant sputum and audible wheezing. She notes no fever, rigors or malaise.  Current Outpatient Prescriptions  Medication Sig Dispense Refill  . aspirin EC 81 MG tablet Take 81 mg by mouth at bedtime.        . benzonatate (TESSALON) 100 MG capsule Take 100 mg by mouth 3 (three) times daily as needed. For cough      . bisoprolol-hydrochlorothiazide (ZIAC) 5-6.25 MG per tablet Take 1 tablet by mouth daily.      . Cyanocobalamin (VITAMIN B-12 CR PO) Take 1 tablet by mouth daily.      . Cyanocobalamin (VITAMIN B-12 IJ) Inject as directed every 30 (thirty) days.      Marland Kitchen HYDROcodone-acetaminophen (NORCO) 5-325 MG per tablet Take 1 tablet by mouth every 6 (six) hours as needed. For pain       . losartan (COZAAR) 100 MG tablet Take 50 mg by mouth daily.      . mupirocin ointment (BACTROBAN) 2 %       . nitroGLYCERIN (NITROSTAT) 0.4 MG SL tablet Place 1 tablet (0.4 mg total) under the tongue every 5 (five) minutes as needed.  25 tablet  3  . PREVACID 24HR 15 MG capsule TAKE ONE CAPSULE DAILY.  28 capsule  11  . Probiotic Product (ALIGN PO) Take by mouth daily.      . simvastatin (ZOCOR) 40 MG tablet TAKE ONE TABLET AT BEDTIME.  30 tablet  6   No current facility-administered medications for this visit.   Allergies  Allergen Reactions  . Iodinated Diagnostic Agents   . Penicillins     REACTION: Unknown reaction  . Sulfonamide Derivatives     REACTION: Unknown reaction  . Tetracycline     REACTION: Unknown reaction     Past medical history, social history, and family history reviewed and updated.  ROS: Denies chest pain, orthopnea, PND or pedal edema. All other systems reviewed  and are negative. Weight has stabilized, and appetite has been normal.  PHYSICAL EXAM: BP 140/64  Pulse 67  Ht 5\' 2"  (1.575 m)  Wt 48.081 kg (106 lb)  BMI 19.38 kg/m2;  Body mass index is 19.38 kg/(m^2). General-Well developed; no acute distress Body habitus-thin Neck-No JVD; no carotid bruits Lungs-scattered coarse rales; resonant to percussion; prolonged expiratory phase Cardiovascular-normal PMI; normal S1 and S2; modest systolic ejection murmur; S4 present Abdomen-normal bowel sounds; soft and non-tender without masses or organomegaly Musculoskeletal-No deformities, no cyanosis or clubbing Neurologic-Normal cranial nerves; symmetric strength and tone Skin-Warm, no significant lesions Extremities-distal pulses intact; 1/2+ ankle edema  EKG:  Normal sinus rhythm; slightly peaked T waves; otherwise normal. No previous tracing for comparison.  [place], MD 12/05/2012  2:22 PM  ASSESSMENT AND PLAN

## 2012-12-05 NOTE — Assessment & Plan Note (Addendum)
Bisoprolol is to be discontinued to determine whether this will improve her respiratory symptoms. Thiazide diuretic will be continued as a separate medication and a calcium channel antagonists added to her regime.

## 2012-12-21 ENCOUNTER — Other Ambulatory Visit: Payer: Self-pay | Admitting: Cardiology

## 2012-12-21 ENCOUNTER — Telehealth: Payer: Self-pay | Admitting: *Deleted

## 2012-12-21 NOTE — Telephone Encounter (Signed)
White coat syndrome is likely cause. No changes in medications. If remains elevated, can consider ambulatory BP study.

## 2012-12-21 NOTE — Telephone Encounter (Signed)
Left detailed message of instructions and for pt to call back to the office on Monday if they decide to perform a holter

## 2012-12-21 NOTE — Telephone Encounter (Signed)
Received call from pt daughter per just leaving doctor apt with bone specialist and was advised by that MD to call her cardiologist per noted BP in the bone spec office 181/82 HR 78, noted they took BP 4 times in office and this was this last reading,pt notes BP at home has been ranging 120/80 daily , pt noted as possible white coat syndrome however seems calm to the daughter at this time, pt denies chest pain/SOB/dizzness/headache/swelling at this time,noted pt medication changed by DR RR 2 weeks ago,please contact daughter on cell at [phone], last OV discharge summary noted on 12-05-12 as follows:  Your physician recommends that you schedule a follow-up appointment in: 4 MONTHS WITH MD/NP AND ONE MONTH FOR A NURSE VISIT TO RE-CHECK BLOOD PRESSURE  Your physician has requested that you regularly monitor and record your blood pressure readings at home. Please use the same machine at the same time of day to check your readings and record them to bring to your follow-up visit.BRING RECORDS WITH YOU AT THE NEXT OFFICE VISIT  Your physician has recommended you make the following change in your medication:  1) START TAKING CIPRO 500MG  TWICE DAILY FOR ONE WEEK  2) STOP TAKING ZIAC  3) START TAKING HCTZ (HYDROCHLORATHIZIDE) 12.5MG  ONCE DAILY  4) START TAKING DILTIAZEM 180MG  ONCE DAILY

## 2012-12-24 ENCOUNTER — Other Ambulatory Visit: Payer: Self-pay | Admitting: *Deleted

## 2012-12-24 MED ORDER — LOSARTAN POTASSIUM 100 MG PO TABS: ORAL_TABLET | ORAL | Status: DC

## 2012-12-24 NOTE — Telephone Encounter (Signed)
Pt noted her BP was down again, the highest noted was 132/78, pt also compared her in home machine to her sisters in home machine and this noted to be the same, pt advised she is feeling fine and was given office number to call with any further concerns if needed, pt will also continue to record BP readings daily, pt daughter and pt declined BP study at this time however will keep in mind for future reference, pt and daughter understood all instructions

## 2012-12-25 ENCOUNTER — Other Ambulatory Visit: Payer: Self-pay | Admitting: Cardiology

## 2012-12-26 ENCOUNTER — Other Ambulatory Visit: Payer: Self-pay | Admitting: *Deleted

## 2012-12-26 MED ORDER — LOSARTAN POTASSIUM 100 MG PO TABS: ORAL_TABLET | ORAL | Status: DC

## 2013-01-03 ENCOUNTER — Other Ambulatory Visit (HOSPITAL_COMMUNITY): Payer: Self-pay | Admitting: Pulmonary Disease

## 2013-01-03 ENCOUNTER — Ambulatory Visit (HOSPITAL_COMMUNITY)
Admission: RE | Admit: 2013-01-03 | Discharge: 2013-01-03 | Disposition: A | Discharge status: Home / Self Care | Payer: Medicare Other | Source: Ambulatory Visit | Attending: Pulmonary Disease | Admitting: Pulmonary Disease

## 2013-01-03 DIAGNOSIS — R112 Nausea with vomiting, unspecified: Secondary | ICD-10-CM

## 2013-01-03 DIAGNOSIS — R109 Unspecified abdominal pain: Secondary | ICD-10-CM | POA: Insufficient documentation

## 2013-01-03 DIAGNOSIS — N200 Calculus of kidney: Secondary | ICD-10-CM | POA: Insufficient documentation

## 2013-01-03 DIAGNOSIS — Z85038 Personal history of other malignant neoplasm of large intestine: Secondary | ICD-10-CM | POA: Insufficient documentation

## 2013-01-03 DIAGNOSIS — R911 Solitary pulmonary nodule: Secondary | ICD-10-CM | POA: Insufficient documentation

## 2013-01-08 ENCOUNTER — Ambulatory Visit (INDEPENDENT_AMBULATORY_CARE_PROVIDER_SITE_OTHER): Payer: Medicare Other | Admitting: *Deleted

## 2013-01-08 ENCOUNTER — Ambulatory Visit (HOSPITAL_COMMUNITY)
Admission: RE | Admit: 2013-01-08 | Discharge: 2013-01-08 | Disposition: A | Discharge status: Home / Self Care | Payer: Medicare Other | Source: Ambulatory Visit | Attending: Cardiology | Admitting: Cardiology

## 2013-01-08 VITALS — BP 158/78 | HR 103 | Ht 62.0 in | Wt 99.0 lb

## 2013-01-08 DIAGNOSIS — R0602 Shortness of breath: Secondary | ICD-10-CM

## 2013-01-08 DIAGNOSIS — J4489 Other specified chronic obstructive pulmonary disease: Secondary | ICD-10-CM | POA: Insufficient documentation

## 2013-01-08 DIAGNOSIS — R918 Other nonspecific abnormal finding of lung field: Secondary | ICD-10-CM | POA: Insufficient documentation

## 2013-01-08 DIAGNOSIS — I10 Essential (primary) hypertension: Secondary | ICD-10-CM

## 2013-01-08 DIAGNOSIS — J449 Chronic obstructive pulmonary disease, unspecified: Secondary | ICD-10-CM | POA: Insufficient documentation

## 2013-01-08 LAB — BASIC METABOLIC PANEL
CO2: 23 mEq/L (ref 19–32)
Chloride: 102 mEq/L (ref 96–112)
Sodium: 135 mEq/L (ref 135–145)

## 2013-01-08 NOTE — Progress Notes (Signed)
Increasing losartan to 100 mg daily. Continue HCTZ. CXR and BMET. Echo.Follow up appt in a couple of weeks.

## 2013-01-08 NOTE — Patient Instructions (Addendum)
Your physician recommends that you schedule a follow-up appointment in: 2-3 weeks with Lorin Picket NP  Your physician has requested that you have an echocardiogram. Echocardiography is a painless test that uses sound waves to create images of your heart. It provides your doctor with information about the size and shape of your heart and how well your heart's chambers and valves are working. This procedure takes approximately one hour. There are no restrictions for this procedure.  A chest x-ray takes a picture of the organs and structures inside the chest, including the heart, lungs, and blood vessels. This test can show several things, including, whether the heart is enlarges; whether fluid is building up in the lungs; and whether pacemaker / defibrillator leads are still in place.  WE WILL CALL YOU WITH YOUR TEST RESULTS/INSTRUCTIONS/NEXT STEPS ONCE RECEIVED BY THE PROVIDER   Your physician has recommended you make the following change in your medication:   1) STOP TAKING DILTIAZEM 2) CONTINUE TAKING HCTZ AS DIRECTED 12.5MG  ONCE DAILY 3) INCREASE YOUR LOSARTAN TO 100MG  (WHOLE TABLET)  ONCE DAILY (NOTED YOU WERE TAKING HALF A TABLET ONCE DAILY)

## 2013-01-08 NOTE — Progress Notes (Signed)
Pt presented to nurse visit today PER CHANGES TO MEDICATION Pt voiced that she is NOT Diltiazem in combination with the HCTZ as advised on 12-05-12 OV by Dr RR, PT IS TAKING THE HCTZ ,PT DID BRING IN THE BP READINGS TO BE SCANNED INTO PT CHART ,noted the following notations by DR RR for reason of change: Bronchiectasis - Kathlen Brunswick, MD at 12/13/2012 2:42 PM   Status: [facility] Orn Related Problem: Bronchiectasis        Patient reports increased symptoms, which have previously been treated with course of antibiotics. Although her chart lists an unknown reaction to tetracycline, patient denies this and notes that she has been treated with similar medications in the past. I recommended a course of doxycycline, but patient preferred ciprofloxacin, which was provided to her. She describes possible bronchospasm exacerbating her chronic lung disease. Treatment with beta blocker will be discontinued to determine if this provides any benefit with respect to pulmonary symptoms.  PT NOTES SHE HAS MORE SOB DAILY WITH EXERTION SINCE THE MEDICATION CHANGES, PT ALSO NOTES FATIGUED DAILY SINCE MEDICATION CHANGES  PT ALSO NOTES A VIRUS LAST WEEK PER DIARRHEA AND VOMITING AND WAS GIVEN CIPRO AGAIN PER DR HAWKINS FOR WHEEZING, NOTED DR RR PLACED PT ON CIPRO ON 12-05-12 PER WHEEZING Please advise if any further instructions are neccessary   [place] Bradford A. Zaylon Bossier L.P.N.  Last OV D/C Summary and VS:  BP 140/64  Pulse 67  Ht 5\' 2"  (1.575 m)  Wt 48.081 kg (106 lb)  BMI 19.38 kg/m2;    Your physician recommends that you schedule a follow-up appointment in: 4 MONTHS WITH MD/NP AND ONE MONTH FOR A NURSE VISIT TO RE-CHECK BLOOD PRESSURE  Your physician has requested that you regularly monitor and record your blood pressure readings at home. Please use the same machine at the same time of day to check your readings and record them to bring to your follow-up visit.BRING RECORDS WITH YOU AT THE NEXT OFFICE VISIT  Your physician  has recommended you make the following change in your medication:  1) START TAKING CIPRO 500MG  TWICE DAILY FOR ONE WEEK  2) STOP TAKING ZIAC  3) START TAKING HCTZ (HYDROCHLORATHIZIDE) 12.5MG  ONCE DAILY  4) START TAKING DILTIAZEM 180MG  ONCE DAILY  NOTED INSTRUCTIONS GIVEN VERBALLY BY Lorin Picket NP, GIVEN TO PT AS ADVISED

## 2013-01-15 ENCOUNTER — Ambulatory Visit (HOSPITAL_COMMUNITY)
Admission: RE | Admit: 2013-01-15 | Discharge: 2013-01-15 | Disposition: A | Discharge status: Home / Self Care | Payer: Medicare Other | Source: Ambulatory Visit | Attending: Cardiology | Admitting: Cardiology

## 2013-01-15 DIAGNOSIS — I10 Essential (primary) hypertension: Secondary | ICD-10-CM | POA: Insufficient documentation

## 2013-01-15 DIAGNOSIS — I369 Nonrheumatic tricuspid valve disorder, unspecified: Secondary | ICD-10-CM

## 2013-01-15 DIAGNOSIS — R0602 Shortness of breath: Secondary | ICD-10-CM

## 2013-01-15 DIAGNOSIS — I2510 Atherosclerotic heart disease of native coronary artery without angina pectoris: Secondary | ICD-10-CM | POA: Insufficient documentation

## 2013-01-15 NOTE — Progress Notes (Signed)
Noted pt had all tests performed, was notified and has f/u with Lorin Picket NP on 01-17-13 Pt will call back to our office with any further concerns if necessary

## 2013-01-15 NOTE — Progress Notes (Signed)
[facility]   2D echo completed 01/15/2013.   Cindy Darcy Cordner, RDCS  

## 2013-01-17 ENCOUNTER — Encounter: Payer: Self-pay | Admitting: Adult Health

## 2013-01-17 ENCOUNTER — Ambulatory Visit (INDEPENDENT_AMBULATORY_CARE_PROVIDER_SITE_OTHER): Payer: Medicare Other | Admitting: Adult Health

## 2013-01-17 VITALS — BP 112/68 | HR 79 | Ht 61.0 in | Wt 100.0 lb

## 2013-01-17 DIAGNOSIS — J449 Chronic obstructive pulmonary disease, unspecified: Secondary | ICD-10-CM

## 2013-01-17 DIAGNOSIS — I7090 Unspecified atherosclerosis: Secondary | ICD-10-CM

## 2013-01-17 DIAGNOSIS — I2510 Atherosclerotic heart disease of native coronary artery without angina pectoris: Secondary | ICD-10-CM

## 2013-01-17 DIAGNOSIS — I10 Essential (primary) hypertension: Secondary | ICD-10-CM

## 2013-01-17 MED ORDER — ALBUTEROL SULFATE HFA 108 (90 BASE) MCG/ACT IN AERS: INHALATION_SPRAY | RESPIRATORY_TRACT | Status: DC

## 2013-01-17 NOTE — Progress Notes (Deleted)
Name: Kayla Lane    DOB: 1926/02/10  Age: 78 y.o.  MR#: [id number]       PCP:  Fredirick Maudlin, MD      Insurance: Payor: [employer] [place] [employer] / Plan: BLUE MEDICARE / Product Type: *No Product type* /   CC:    Chief Complaint  Patient presents with  . Hypertension  . Coronary Artery Disease    VS Filed Vitals:   01/17/13 1435  BP: 112/68  Pulse: 79  Height: 5\' 1"  (1.549 m)  Weight: 100 lb (45.36 kg)    Weights Current Weight  01/17/13 100 lb (45.36 kg)  01/08/13 99 lb (44.906 kg)  12/05/12 106 lb (48.081 kg)    Blood Pressure  BP Readings from Last 3 Encounters:  01/17/13 112/68  01/08/13 158/78  12/05/12 140/64     Admit date:  (Not on file) Last encounter with RMR:  Visit date not found   Allergy Iodinated diagnostic agents; Penicillins; Sulfonamide derivatives; and Tetracycline  Current Outpatient Prescriptions  Medication Sig Dispense Refill  . aspirin EC 81 MG tablet Take 81 mg by mouth at bedtime.        . benzonatate (TESSALON) 100 MG capsule Take 100 mg by mouth 3 (three) times daily as needed. For cough      . Cyanocobalamin (VITAMIN B-12 CR PO) Take 1 tablet by mouth daily.      . Cyanocobalamin (VITAMIN B-12 IJ) Inject as directed every 30 (thirty) days.      . hydrochlorothiazide (MICROZIDE) 12.5 MG capsule Take 1 capsule (12.5 mg total) by mouth daily.  90 capsule  1  . HYDROcodone-acetaminophen (NORCO) 5-325 MG per tablet Take 1 tablet by mouth every 6 (six) hours as needed. For pain       . losartan (COZAAR) 100 MG tablet Take 100 mg by mouth daily. TAKE (1/2) TABLET BY MOUTH DAILY.      . mupirocin ointment (BACTROBAN) 2 %       . nitroGLYCERIN (NITROSTAT) 0.4 MG SL tablet Place 1 tablet (0.4 mg total) under the tongue every 5 (five) minutes as needed.  25 tablet  3  . PREVACID 24HR 15 MG capsule TAKE ONE CAPSULE DAILY.  28 capsule  11  . Probiotic Product (ALIGN PO) Take by mouth daily.      . simvastatin (ZOCOR) 40 MG tablet  TAKE ONE TABLET AT BEDTIME.  30 tablet  6   No current facility-administered medications for this visit.    Discontinued Meds:    Medications Discontinued During This Encounter  Medication Reason  . ciprofloxacin (CIPRO) 500 MG tablet Error    Patient Active Problem List   Diagnosis Date Noted  . Osteoarthritis 02/10/2012  . Bronchiectasis 02/10/2012  . Arteriosclerotic cardiovascular disease (ASCVD)   . Hypertension   . Gastroesophageal reflux disease   . Colon carcinoma   . Weight loss   . Mycobacterium avium-intracellulare infection   . Subclinical hypothyroidism 02/01/2011  . Anemia, chronic disease 01/31/2011  . Chronic kidney disease (CKD), stage III (moderate) 01/31/2011  . Hyperlipidemia 01/30/2011  . Lung nodule 01/10/2011  . CEREBROVASCULAR DISEASE 12/25/2009    LABS    Component Value Date/Time   NA 135 01/08/2013 1650   NA 135 05/28/2012 1400   NA 141 02/13/2012 1625   K 4.2 01/08/2013 1650   K 4.3 05/28/2012 1400   K 4.6 02/13/2012 1625   CL 102 01/08/2013 1650   CL 100 05/28/2012 1400  CL 104 02/13/2012 1625   CO2 23 01/08/2013 1650   CO2 28 05/28/2012 1400   CO2 27 02/13/2012 1625   GLUCOSE 97 01/08/2013 1650   GLUCOSE 87 05/28/2012 1400   GLUCOSE 81 02/13/2012 1625   BUN 30* 01/08/2013 1650   BUN 13 05/28/2012 1400   BUN 15 02/13/2012 1625   CREATININE 1.21* 01/08/2013 1650   CREATININE 0.77 05/28/2012 1400   CREATININE 0.69 02/13/2012 1625   CREATININE 0.76 02/07/2011 0715   CREATININE 0.77 02/06/2011 0525   CREATININE 0.70 02/05/2011 1353   CALCIUM 9.8 01/08/2013 1650   CALCIUM 9.7 05/28/2012 1400   CALCIUM 9.6 02/13/2012 1625   GFRNONAA 76* 02/07/2011 0715   GFRNONAA 76* 02/06/2011 0525   GFRNONAA 78* 02/05/2011 1353   GFRAA 88* 02/07/2011 0715   GFRAA 88* 02/06/2011 0525   GFRAA >90 02/05/2011 1353   CMP     Component Value Date/Time   NA 135 01/08/2013 1650   K 4.2 01/08/2013 1650   CL 102 01/08/2013 1650   CO2 23 01/08/2013 1650   GLUCOSE 97  01/08/2013 1650   BUN 30* 01/08/2013 1650   CREATININE 1.21* 01/08/2013 1650   CREATININE 0.76 02/07/2011 0715   CALCIUM 9.8 01/08/2013 1650   PROT 6.6 05/28/2012 1400   ALBUMIN 4.4 05/28/2012 1400   AST 20 05/28/2012 1400   ALT <8 05/28/2012 1400   ALKPHOS 86 05/28/2012 1400   BILITOT 0.5 05/28/2012 1400   GFRNONAA 76* 02/07/2011 0715   GFRAA 88* 02/07/2011 0715       Component Value Date/Time   WBC 6.9 06/01/2011 0900   WBC 5.6 02/07/2011 0715   WBC 6.6 02/06/2011 0525   HGB 12.9 06/01/2011 0900   HGB 9.6* 02/07/2011 0715   HGB 9.2* 02/06/2011 0525   HCT 41.4 06/01/2011 0900   HCT 28.7* 02/07/2011 0715   HCT 27.0* 02/06/2011 0525   MCV 96.3 06/01/2011 0900   MCV 96.3 02/07/2011 0715   MCV 94.4 02/06/2011 0525    Lipid Panel     Component Value Date/Time   CHOL 166 06/01/2011 0900   TRIG 98 06/01/2011 0900   HDL 66 06/01/2011 0900   CHOLHDL 2.5 06/01/2011 0900   VLDL 20 06/01/2011 0900   LDLCALC 80 06/01/2011 0900    ABG No results found for this basename: phart, pco2, pco2art, po2, po2art, hco3, tco2, acidbasedef, o2sat     Lab Results  Component Value Date   TSH 5.756* 01/30/2011   BNP (last 3 results) No results found for this basename: PROBNP,  in the last 8760 hours Cardiac Panel (last 3 results) No results found for this basename: CKTOTAL, CKMB, TROPONINI, RELINDX,  in the last 72 hours  Iron/TIBC/Ferritin    Component Value Date/Time   IRON 48 01/09/2011 1619   TIBC 224* 01/09/2011 1619   FERRITIN 202 01/09/2011 1619     EKG Orders placed in visit on 12/05/12  . EKG 12-LEAD     Prior Assessment and Plan Problem List as of 01/17/2013     Cardiovascular and Mediastinum   CEREBROVASCULAR DISEASE   Last Assessment & Plan   05/28/2012 Office Visit Edited 05/31/2012  6:12 PM by Kathlen Brunswick, MD     Significant obstructive right carotid disease in the past, which is managed by Dr. Darrick Penna.    Arteriosclerotic cardiovascular disease (ASCVD)   Last Assessment &  Plan   05/28/2012 Office Visit Edited 05/28/2012  3:01 PM by Kathlen Brunswick, MD  The patient has had no further apparent problems with coronary disease since small acute myocardial infarction 18 months ago. Risk factor management is optimized and will continue to be the focus of therapy.    Hypertension   Last Assessment & Plan   12/05/2012 Office Visit Edited 12/13/2012  2:43 PM by Kathlen Brunswick, MD     Bisoprolol is to be discontinued to determine whether this will improve her respiratory symptoms. Thiazide diuretic will be continued as a separate medication and a calcium channel antagonists added to her regime.      Respiratory   Bronchiectasis   Last Assessment & Plan   12/05/2012 Office Visit Edited 12/13/2012  2:42 PM by Kathlen Brunswick, MD     Patient reports increased symptoms, which have previously been treated with course of antibiotics. Although her chart lists an unknown reaction to tetracycline, patient denies this and notes that she has been treated with similar medications in the past. I recommended a course of doxycycline, but patient preferred ciprofloxacin, which was provided to her.  She describes possible bronchospasm exacerbating her chronic lung disease. Treatment with beta blocker will be discontinued to determine if this provides any benefit with respect to pulmonary symptoms.      Digestive   Gastroesophageal reflux disease   Colon carcinoma     Endocrine   Subclinical hypothyroidism   Last Assessment & Plan   05/28/2012 Office Visit Written 05/28/2012  2:05 PM by Kathlen Brunswick, MD     Slightly elevated TSH with normal free T3 and T4 18 months ago; no current symptoms of hypothyroidism.      Musculoskeletal and Integument   Osteoarthritis     Genitourinary   Chronic kidney disease (CKD), stage III (moderate)   Last Assessment & Plan   11/25/2011 Office Visit Edited 11/25/2011  9:52 PM by Kathlen Brunswick, MD     No apparent significant renal disease  with a recent creatinine in 05/2011 of 0.83 and values well less than 1.0 for at least the past 2 years.      Other   Hyperlipidemia   Last Assessment & Plan   05/28/2012 Office Visit Written 05/28/2012  3:03 PM by Kathlen Brunswick, MD     Lipid profile performed last year was excellent; current lipid lowering therapy will be continued.    Anemia, chronic disease   Last Assessment & Plan   05/28/2012 Office Visit Written 05/28/2012  3:00 PM by Kathlen Brunswick, MD     CBC was reassessed last year and was normal. It appears that the anemia has resolved without a specific diagnosis being established.    Weight loss   Last Assessment & Plan   05/28/2012 Office Visit Written 05/28/2012  2:07 PM by Kathlen Brunswick, MD     30 pound weight loss in 2010; weight subsequently stable.    Mycobacterium avium-intracellulare infection   Lung nodule   Last Assessment & Plan   12/05/2012 Office Visit Written 12/05/2012  7:45 PM by Kathlen Brunswick, MD     The most recent Chest CT I can identify was performed in 2011.  In light of her multiple parenchymal abnormalities, Dr. Juanetta Gosling may wish to repeat that study.        Imaging: Ct Abdomen Pelvis Wo Contrast  01/03/2013   CLINICAL DATA:  Abdominal pain, nausea, vomiting, status post hysterectomy, appendectomy, colectomy, history of colitis, colon carcinoma.  EXAM: CT ABDOMEN AND PELVIS WITHOUT CONTRAST  TECHNIQUE: Multidetector  CT imaging of the abdomen and pelvis was performed following the standard protocol without intravenous contrast.  COMPARISON:  01/06/2011, lumbar spine MRI 09/29/2012  FINDINGS: Lung bases show stable reticulonodular scarring bilaterally. Small parenchymal nodule in right base centrally measures 4 mm stable from prior exam.  The study is limited without IV contrast. Unenhanced liver shows no biliary ductal dilatation. No calcified gallstones are noted within gallbladder. Atherosclerotic calcifications of abdominal aorta, bilateral  renal artery and bilateral iliac arteries are noted. Splenic artery calcifications.  No aortic aneurysm. Unenhanced pancreas, spleen and adrenals are unremarkable.  Unenhanced kidneys are symmetrical in size. Nonobstructive calcified calculus in upper pole of the right kidney measures 4 mm.  No calcified ureteral calculi are noted bilaterally.  No small bowel obstruction. No pericecal inflammation. Moderate stool noted in right colon and proximal transverse colon. Moderate gaseous distension of distal transverse colon and left colon. Moderate gaseous distension of proximal sigmoid colon. Oral contrast material was given to the patient.  There is no evidence of colitis or diverticulitis on this unenhanced scan.  The patient is status post hysterectomy. Distal sigmoid colon and rectum is empty partially collapsed. The urinary bladder is unremarkable. No pelvic ascites or adenopathy. Postsurgical changes noted lower abdominal wall.  No hydronephrosis or hydroureter.  No destructive bony lesions are noted within pelvis. Sagittal images of the spine shows diffuse osteopenia.  There is interval vertebroplasty at T12 and L1 level. Stable degenerative changes lumbar spine.  IMPRESSION: 1. Bilateral lung bases reticulonodular scarring. Stable small nodule in right base centrally. 2. Moderate stool noted in right colon. No small bowel or colonic obstruction. Moderate stool noted in proximal transverse colon. Moderate gaseous distension of distal transverse colon. Mild gaseous distension of the left colon. Moderate gaseous distension proximal sigmoid colon. No distal colonic obstruction. 3. Status post appendectomy. Status post hysterectomy. 4. No pericecal inflammation. No evidence of colitis or diverticulitis. 5. There is right nonobstructive nephrolithiasis. No hydronephrosis or hydroureter. 6. Prior vertebroplasty and chronic compression deformities of T12 and L1 vertebral body. Degenerative changes lumbar spine.    Electronically Signed   By: Natasha Mead   On: 01/03/2013 13:52   Dg Chest 2 View  01/08/2013   CLINICAL DATA:  Short of breath. Hypertension.  EXAM: CHEST  2 VIEW  COMPARISON:  07/10/2012  FINDINGS: The lungs are hyperexpanded with flattened hemidiaphragms. Two contiguous nodules in the right upper lobe are stable. The lungs are otherwise clear. No pleural effusion or pneumothorax.  The cardiac silhouette is normal in size. The mediastinum is normal in contour.  Bony thorax is demineralized. Two compression fractures have been treated with vertebroplasty at the thoracolumbar junction.  IMPRESSION: No acute cardiopulmonary disease.  COPD.  Stable right upper lobe nodules.   Electronically Signed   By: Amie Portland   On: 01/08/2013 17:19

## 2013-01-17 NOTE — Assessment & Plan Note (Signed)
Blood pressure is low normal. She remains on Cozaar 100 mg (one half tablet) daily for total of 50 mg daily. I have told her that if she begins still dizzy or lightheaded to please let us know and we may need to adjust her dosing. Her blood pressures dropped considerably since last evaluation on 01/08/2013 at 158/78 down to 112/68.

## 2013-01-17 NOTE — Assessment & Plan Note (Signed)
Wheezing and dyspnea are not related to cardiac etiology at this time. She will continue on her current medication regimen without beta blocker HCTZ presently. There is no evidence of CHF or complaints of chest pain.

## 2013-01-17 NOTE — Assessment & Plan Note (Addendum)
The patient has no evidence of CHF, and therefore do not feel that wheezing is associated with this. COPD continues to be an ongoing issue for her. She is not on any breathing treatments, inhalers, or steroids at this time. Dr. Juanetta Gosling, her primary care physician, is unavailable at this time as he is on vacation. I prescribed her Proventil inhaler one puff in the morning and one puff in the evening. She will need to have close followup with Dr. Juanetta Gosling for ongoing treatment in and management. We will continue to keep her off beta blockers for now.

## 2013-01-17 NOTE — Progress Notes (Signed)
HPI: Kayla Lane is a 79 year old former patient of Dr. Dietrich Pates we are following for ongoing assessment and management of CAD, and hypertension. She was last seen by Dr. Dietrich Pates on 12/13/2012 with complaints of cough and congestion. She was placed on ciprofloxacin by Dr. Dietrich Pates. Beta blocker was discontinued in the setting of possible bronchospasms. Calcium channel blocker, diltiazem 180 mg was added to her medication regimen. She is here for followup concerning symptoms and medication tolerance.   Since being seen last, the patient has not taken diltiazem secondary to hypotension, and is no longer taking HCTZ as well. Her breathing status is not improved. She continues to have increased wheezing, dyspnea on exertion, and nonproductive coughing. She is normally followed by her primary care physician Dr. Kari Baars, who is out of town at this time. She denies any chest pain, but has had some mild dizziness with shortness of breath. He has been diagnosed with COPD.   Allergies  Allergen Reactions  . Iodinated Diagnostic Agents   . Penicillins     REACTION: Unknown reaction  . Sulfonamide Derivatives     REACTION: Unknown reaction  . Tetracycline     REACTION: Unknown reaction    Current Outpatient Prescriptions  Medication Sig Dispense Refill  . aspirin EC 81 MG tablet Take 81 mg by mouth at bedtime.        . benzonatate (TESSALON) 100 MG capsule Take 100 mg by mouth 3 (three) times daily as needed. For cough      . Cyanocobalamin (VITAMIN B-12 CR PO) Take 1 tablet by mouth daily.      . Cyanocobalamin (VITAMIN B-12 IJ) Inject as directed every 30 (thirty) days.      . hydrochlorothiazide (MICROZIDE) 12.5 MG capsule Take 1 capsule (12.5 mg total) by mouth daily.  90 capsule  1  . HYDROcodone-acetaminophen (NORCO) 5-325 MG per tablet Take 1 tablet by mouth every 6 (six) hours as needed. For pain       . losartan (COZAAR) 100 MG tablet Take 100 mg by mouth daily. TAKE (1/2) TABLET BY  MOUTH DAILY.      . mupirocin ointment (BACTROBAN) 2 %       . nitroGLYCERIN (NITROSTAT) 0.4 MG SL tablet Place 1 tablet (0.4 mg total) under the tongue every 5 (five) minutes as needed.  25 tablet  3  . PREVACID 24HR 15 MG capsule TAKE ONE CAPSULE DAILY.  28 capsule  11  . Probiotic Product (ALIGN PO) Take by mouth daily.      . simvastatin (ZOCOR) 40 MG tablet TAKE ONE TABLET AT BEDTIME.  30 tablet  6  . albuterol (PROVENTIL HFA;VENTOLIN HFA) 108 (90 BASE) MCG/ACT inhaler 1 puff in the am and 1 puff in the evening.  1 Inhaler  2   No current facility-administered medications for this visit.    Past Medical History  Diagnosis Date  . Arteriosclerotic cardiovascular disease (ASCVD) 1994     Stent to RCA and LAD at [facility] in 1994; neg. stress nuclear 12/98; 04/2005:40% LAD; 70% CX;  echocardiogram in 2009-normal EF; mild to moderate MR.; 01/2011  Non-ST segment elevation MI->  BMS to the distal LAD ,40% in-stent stenosis in proximal LAD, 90% proximal OM1, 40-50% in-stent RCA stenosis, 50% PDA  . Cerebrovascular disease     bilateral bruits; 7/08-50-69% right and left internal carotid artery stenosis;  2011: 70-80% right; 50% left   . Hypertension   . Hyperlipidemia   . Spinal stenosis   .  Colitis 2011    2011  . Degenerative joint disease   . Gastroesophageal reflux disease 2002    Hiatal hernia; dysphasia due to cervical web-dilated in 2002  . Colon carcinoma 1996    left hemicolectomy  . Weight loss 2009    30 pounds 2009-2011  . Mycobacterium avium-intracellulare infection   . Lung nodule 01/2011    right lower lobe; re-image in 08/2011  . Pneumonia   . Myocardial infarction     Heart Attack  . Osteoarthritis 02/10/2012  . Back pain     Past Surgical History  Procedure Laterality Date  . Total abdominal hysterectomy    . Appendectomy    . Partial colectomy  1996    Left for carcinoma  . Kyphosis surgery      Percutaneous  . Tonsillectomy    . Colonoscopy  01/2009; 11/2009      Dr. Rourk-->surgical anastomosis at 18cm. Residual rectal and colonic mucosa appeared normal; Dr. Marlowe Alt papilla, surgical anastomosis at 18cm  . Esophagogastroduodenoscopy  01/2009; 11/2009    Dr. Carron Curie cervical esophageal web s/p dilation, small hh; Dr. Ronni Rumble hh  . Coronary stent placement  02/02/11  . Bladder suspension    . Cataract extraction      Bilateral    [id number] of systems complete and found to be negative unless listed above  PHYSICAL EXAM BP 112/68  Pulse 79  Ht 5\' 1"  (1.549 m)  Wt 100 lb (45.36 kg)  BMI 18.9 kg/m2  General: Well developed, well nourished, thin, in no acute distress Head: Eyes PERRLA, No xanthomas.   Normal cephalic and atramatic  Lungs: Inspiratory wheezing is noted, with crackles in the bases. Heart: HRRR S1 S2, without MRG.  Pulses are 2+ & equal.            No carotid bruit. No JVD.  No abdominal bruits. No femoral bruits. Abdomen: Bowel sounds are positive, abdomen soft and non-tender without masses or                  Hernia's noted. Msk:  Back normal, slow but normal gait. Normal strength and tone for age. Extremities: No clubbing, cyanosis or edema.  DP +1 Neuro: Alert and oriented X 3. Psych:  Good affect, responds appropriately    ASSESSMENT AND PLAN

## 2013-01-17 NOTE — Patient Instructions (Signed)
Your physician recommends that you schedule a follow-up appointment in: 6 months You will receive a reminder letter two months in advance reminding you to call and schedule your appointment. If you don't receive this letter, please contact our office.  Your physician has recommended you make the following change in your medication:  1. Start Proventil Inhaler 1 puff in the am and 1 puff in the evening.

## 2013-01-18 ENCOUNTER — Other Ambulatory Visit: Payer: Self-pay | Admitting: *Deleted

## 2013-01-18 MED ORDER — LOSARTAN POTASSIUM 100 MG PO TABS: 50.0000 mg | ORAL_TABLET | Freq: Every day | ORAL | Status: AC

## 2013-01-21 ENCOUNTER — Encounter: Payer: Self-pay | Admitting: Adult Health

## 2013-01-24 ENCOUNTER — Ambulatory Visit: Payer: Medicare Other | Admitting: Adult Health

## 2013-01-31 ENCOUNTER — Other Ambulatory Visit: Payer: Medicare Other

## 2013-01-31 ENCOUNTER — Ambulatory Visit: Payer: Self-pay | Admitting: Family

## 2013-02-06 ENCOUNTER — Other Ambulatory Visit: Payer: Self-pay | Admitting: Gastroenterology

## 2013-02-06 ENCOUNTER — Encounter: Payer: Self-pay | Admitting: Family

## 2013-02-07 ENCOUNTER — Encounter: Payer: Self-pay | Admitting: Family

## 2013-02-07 ENCOUNTER — Ambulatory Visit (HOSPITAL_COMMUNITY)
Admission: RE | Admit: 2013-02-07 | Discharge: 2013-02-07 | Disposition: A | Discharge status: Home / Self Care | Payer: Medicare Other | Source: Ambulatory Visit | Attending: Family | Admitting: Family

## 2013-02-07 ENCOUNTER — Ambulatory Visit (INDEPENDENT_AMBULATORY_CARE_PROVIDER_SITE_OTHER): Payer: Medicare Other | Admitting: Family

## 2013-02-07 DIAGNOSIS — I6529 Occlusion and stenosis of unspecified carotid artery: Secondary | ICD-10-CM | POA: Insufficient documentation

## 2013-02-07 DIAGNOSIS — I739 Peripheral vascular disease, unspecified: Secondary | ICD-10-CM

## 2013-02-07 NOTE — Progress Notes (Signed)
Established Carotid Patient  Previous Carotid surgery: No  History of Present Illness  Kayla Lane is a 79 y.o. female patient of Dr. Darrick Penna with known history of carotid stenosis. Is working with physical therapy to help strengthen her core due to spine problems. Has achey feeling in thighs after walking about 2 minutes, along with dyspnea, has COPD, but never did smoke. States he bruises easily and the bruise takes a long time to go away. While hospitalized at [facility] with pneumonia 2 years ago she had an MI and had a cardiac stent placed. Is being followed by a cardiologist and pulmonologist.  Patient has Negative history of TIA or stroke symptom.  The patient denies amaurosis fugax or monocular blindness.  The patient  denies facial drooping.  Pt. denies hemiplegia.  The patient denies receptive or expressive aphasia.  Pt.reports generalized weakness.  Patient  reports New Medical or Surgical History: 2 vertebrae cemented due to fracture; her dyspnea is dramatically worse.  Pt Diabetic: No Pt smoker: non-smoker  Pt meds include: Statin : Yes ASA: Yes Other anticoagulants/antiplatelets: no; took Plavix for 1 year post MI, discontinued a year ago.   Past Medical History  Diagnosis Date  . Arteriosclerotic cardiovascular disease (ASCVD) 1994     Stent to RCA and LAD at [facility] in 1994; neg. stress nuclear 12/98; 04/2005:40% LAD; 70% CX;  echocardiogram in 2009-normal EF; mild to moderate MR.; 01/2011  Non-ST segment elevation MI->  BMS to the distal LAD ,40% in-stent stenosis in proximal LAD, 90% proximal OM1, 40-50% in-stent RCA stenosis, 50% PDA  . Cerebrovascular disease     bilateral bruits; 7/08-50-69% right and left internal carotid artery stenosis;  2011: 70-80% right; 50% left   . Hypertension   . Hyperlipidemia   . Spinal stenosis   . Colitis 2011    2011  . Degenerative joint disease   . Gastroesophageal reflux disease 2002    Hiatal hernia; dysphasia due to cervical  web-dilated in 2002  . Colon carcinoma 1996    left hemicolectomy  . Weight loss 2009    30 pounds 2009-2011  . Mycobacterium avium-intracellulare infection   . Lung nodule 01/2011    right lower lobe; re-image in 08/2011  . Pneumonia   . Myocardial infarction     Heart Attack  . Osteoarthritis 02/10/2012  . Back pain October 04, 2012    Pt. fell outside    Social History History  Substance Use Topics  . Smoking status: Never Smoker   . Smokeless tobacco: Never Used  . Alcohol Use: No    Family History Family History  Problem Relation Age of Onset  . Diabetes type II Other   . Kidney cancer Brother   . Cancer Brother     Kidney and pancriatic  . Hypertension Mother   . Varicose Veins Mother   . Peripheral vascular disease Mother   . Diabetes Sister   . Heart disease Father     Surgical History Past Surgical History  Procedure Laterality Date  . Appendectomy    . Partial colectomy  1996    Left for carcinoma  . Kyphosis surgery      Percutaneous  . Tonsillectomy    . Colonoscopy  01/2009; 11/2009    Dr. Rourk-->surgical anastomosis at 18cm. Residual rectal and colonic mucosa appeared normal; Dr. Marlowe Alt papilla, surgical anastomosis at 18cm  . Esophagogastroduodenoscopy  01/2009; 11/2009    Dr. Carron Curie cervical esophageal web s/p dilation, small hh; Dr. Ronni Rumble hh  .  Coronary stent placement  02/02/11  . Bladder suspension    . Cataract extraction      Bilateral  . Fracture surgery  10-04-12    Fx back T-12 and L-1  . Total abdominal hysterectomy  1974  . Eye surgery      Allergies  Allergen Reactions  . Iodinated Diagnostic Agents   . Penicillins     REACTION: Unknown reaction  . Sulfonamide Derivatives     REACTION: Unknown reaction  . Tetracycline     REACTION: Unknown reaction    Current Outpatient Prescriptions  Medication Sig Dispense Refill  . albuterol (PROVENTIL HFA;VENTOLIN HFA) 108 (90 BASE) MCG/ACT inhaler 1 puff in the  am and 1 puff in the evening.  1 Inhaler  2  . aspirin EC 81 MG tablet Take 81 mg by mouth at bedtime.        . benzonatate (TESSALON) 100 MG capsule Take 100 mg by mouth 3 (three) times daily as needed. For cough      . Cyanocobalamin (VITAMIN B-12 CR PO) Take 1 tablet by mouth daily.      . Cyanocobalamin (VITAMIN B-12 IJ) Inject as directed every 30 (thirty) days.      Marland Kitchen HYDROcodone-acetaminophen (NORCO) 5-325 MG per tablet Take 1 tablet by mouth every 6 (six) hours as needed. For pain       . losartan (COZAAR) 100 MG tablet Take 0.5 tablets (50 mg total) by mouth daily. TAKE (1/2) TABLET BY MOUTH DAILY.  15 tablet  5  . mupirocin ointment (BACTROBAN) 2 %       . nitroGLYCERIN (NITROSTAT) 0.4 MG SL tablet Place 1 tablet (0.4 mg total) under the tongue every 5 (five) minutes as needed.  25 tablet  3  . PREVACID 24HR 15 MG capsule TAKE ONE CAPSULE DAILY.  28 capsule  11  . Probiotic Product (ALIGN PO) Take by mouth daily.      . simvastatin (ZOCOR) 40 MG tablet TAKE ONE TABLET AT BEDTIME.  30 tablet  6  . hydrochlorothiazide (MICROZIDE) 12.5 MG capsule Take 1 capsule (12.5 mg total) by mouth daily.  90 capsule  1   No current facility-administered medications for this visit.    Review of Systems : [x]  Positive   [ ]  Denies  General:[ ]  Weight loss,  [ ]  Weight gain, [ ]  Loss of appetite, [ ]  Fever, [ ]  chills  Neurologic: [ ]  Dizziness, [ ]  Blackouts, [ ]  Headaches, [ ]  Seizure [ ]  Stroke, [ ]  "Mini stroke", [ ]  Slurred speech, [ ]  Temporary blindness;  [ ] weakness,  Ear/Nose/Throat: [ ]  Change in hearing, [ ]  Nose bleeds, [ ]  Hoarseness  Vascular:[ ]  Pain in legs with walking, [ ]  Pain in feet while lying flat , [ ]   Non-healing ulcer, [ ]  Blood clot in vein,    Pulmonary: [ ]  Home oxygen, [ ]   Productive cough, [ ]  Bronchitis, [ ]  Coughing up blood,  [ ]  Asthma, [ ]  Wheezing  Musculoskeletal:  [ ]  Arthritis, [ ]  Joint pain, [ ]  low back pain  Cardiac: [ ]  Chest pain, [ ]  Shortness  of breath when lying flat, [ ]  Shortness of breath with exertion, [ ]  Palpitations, [ ]  Heart murmur, [ ]   Atrial fibrillation  Hematologic:[ ]  Easy Bruising, [ ]  Anemia; [ ]  Hepatitis  Psychiatric: [ ]   Depression, [ ]  Anxiety   Gastrointestinal: [ ]  Black stool, [ ]  Blood in stool, [ ]  Peptic  ulcer disease,  [ ]  Gastroesophageal Reflux, [ ]  Trouble swallowing, [ ]  Diarrhea, [ ]  Constipation  Urinary: [ ]  chronic Kidney disease, [ ]  on HD, [ ]  Burning with urination, [ ]  Frequent urination, [ ]  Difficulty urinating;   Skin: [ ]  Rashes, [ ]  Wounds    Physical Examination  Filed Vitals:   02/07/13 1111  BP: 158/75  Pulse: 89  Resp:    Filed Weights   02/07/13 1107  Weight: 102 lb 8 oz (46.494 kg)   Body mass index is 18.74 kg/(m^2).   General: WDWN female in NAD GAIT: antalgic Eyes: PERRLA Pulmonary:   Positive  Rales, Positive rhonchi, & Negative wheezing, diminished air movement in all lung fields.  Cardiac: regular Rhythm ,  Positive Murmur.  VASCULAR EXAM Carotid Bruits Left Right   Negative Negative    Aorta is is palpable. Radial pulses are 2+ palpable and equal.                                                                                                                            LE Pulses LEFT RIGHT       FEMORAL   palpable   palpable        POPLITEAL  not palpable   not palpable       POSTERIOR TIBIAL  not palpable   not palpable        DORSALIS PEDIS      ANTERIOR TIBIAL  palpable   palpable     Gastrointestinal: soft, nontender, BS WNL, no r/g,  negative masses.  Musculoskeletal: Negative muscle atrophy/wasting. M/S 3/5 in UE's, 2/5 in LE's, Extremities without ischemic changes.  Neurologic: A&O X 3; Appropriate Affect ; SENSATION ;normal;  Speech is normal CN 2-12 intact,Pain and light touch intact extremities, Motor exam as listed above.   Non-Invasive Vascular Imaging CAROTID DUPLEX 02/07/2013   Right ICA: 40 - 59 % stenosis. Left  ICA: <40% stenosis.  Previous carotid studies demonstrated: RICA 40 - 59 % stenosis, LICA <40% stenosis.  These findings are Unchanged from previous exam.  Assessment: Kayla Lane is a 79 y.o. female who presents with asymptomatic 40 - 59 % Right ICA stenosis and <40% left ICA stenosis. The  ICA stenosis is  Unchanged from previous exam. Brachial pressures are equal. All CVD risk factors are addressed for her except she is limited in her exercise by spine issues, associated pain, and worsening dyspnea. Since she has claudication symptoms in anterior thighs after walking about 2 minutes, offered ABI's in a couple of weeks; however, if this shows significant arterial occlusive disease pt. May not be able to tolerate any intervention for this. Pt. Will call and let me know if she would like the ABI's scheduled soon.  Plan: Follow-up in 1 year with Carotid Duplex scan and ABI's.   I discussed in depth with the patient the nature of atherosclerosis, and emphasized the importance of maximal medical management including  strict control of blood pressure, blood glucose, and lipid levels, obtaining regular exercise, and continued cessation of smoking.  The patient is aware that without maximal medical management the underlying atherosclerotic disease process will progress, limiting the benefit of any interventions. The patient was given information about stroke prevention and what symptoms should prompt the patient to seek immediate medical care. Thank you for allowing [place] to participate in this patient's care.  Charisse March, RN, MSN, FNP-C [facility] Office: [phone]  Clinic Physician: Darrick Penna  02/07/2013 11:16 AM

## 2013-02-07 NOTE — Patient Instructions (Addendum)
Stroke Prevention Some medical conditions and behaviors are associated with an increased chance of having a stroke. You may prevent a stroke by making healthy choices and managing medical conditions. Reduce your risk of having a stroke by:  Staying physically active. Get at least 30 minutes of activity on most or all days.  Not smoking. It may also be helpful to avoid exposure to secondhand smoke.  Limiting alcohol use. Moderate alcohol use is considered to be:  No more than 2 drinks per day for men.  No more than 1 drink per day for nonpregnant women.  Eating healthy foods.  Include 5 or more servings of fruits and vegetables a day.  Certain diets may be prescribed to address high blood pressure, high cholesterol, diabetes, or obesity.  Managing your cholesterol levels.  A low-saturated fat, low-trans fat, low-cholesterol, and high-fiber diet may control cholesterol levels.  Take any prescribed medicines to control cholesterol as directed by your caregiver.  Managing your diabetes.  A controlled-carbohydrate, controlled-sugar diet is recommended to manage diabetes.  Take any prescribed medicines to control diabetes as directed by your caregiver.  Controlling your high blood pressure (hypertension).  A low-salt (sodium), low-saturated fat, low-trans fat, and low-cholesterol diet is recommended to manage high blood pressure.  Take any prescribed medicines to control hypertension as directed by your caregiver.  Maintaining a healthy weight.  A reduced-calorie, low-sodium, low-saturated fat, low-trans fat, low-cholesterol diet is recommended to manage weight.  Stopping drug abuse.  Avoiding birth control pills.  Talk to your caregiver about the risks of taking birth control pills if you are over 35 years old, smoke, get migraines, or have ever had a blood clot.  Getting evaluated for sleep disorders (sleep apnea).  Talk to your caregiver about getting a sleep evaluation  if you snore a lot or have excessive sleepiness.  Taking medicines as directed by your caregiver.  For some people, aspirin or blood thinners (anticoagulants) are helpful in reducing the risk of forming abnormal blood clots that can lead to stroke. If you have the irregular heart rhythm of atrial fibrillation, you should be on a blood thinner unless there is a good reason you cannot take them.  Understand all your medicine instructions. SEEK IMMEDIATE MEDICAL CARE IF:   You have sudden weakness or numbness of the face, arm, or leg, especially on one side of the body.  You have sudden confusion.  You have trouble speaking (aphasia) or understanding.  You have sudden trouble seeing in one or both eyes.  You have sudden trouble walking.  You have dizziness.  You have a loss of balance or coordination.  You have a sudden, severe headache with no known cause.  You have new chest pain or an irregular heartbeat. Any of these symptoms may represent a serious problem that is an emergency. Do not wait to see if the symptoms will go away. Get medical help right away. Call your local emergency services (911 in [place] Do not drive yourself to the hospital. Document Released: 05/05/2004 Document Revised: 06/20/2011 Document Reviewed: 11/15/2010 ExitCare Patient Information 2014 [employer].   Peripheral Vascular Disease Peripheral Vascular Disease (PVD), also called Peripheral Arterial Disease (PAD), is a circulation problem caused by cholesterol (atherosclerotic plaque) deposits in the arteries. PVD commonly occurs in the lower extremities (legs) but it can occur in other areas of the body, such as your arms. The cholesterol buildup in the arteries reduces blood flow which can cause pain and other serious problems. The presence   of PVD can place a person at risk for Coronary Artery Disease (CAD).  CAUSES  Causes of PVD can be many. It is usually associated with more than one risk factor such  as:   High Cholesterol.  Smoking.  Diabetes.  Lack of exercise or inactivity.  High blood pressure (hypertension).  Obesity.  Family history. SYMPTOMS   When the lower extremities are affected, patients with PVD may experience:  Leg pain with exertion or physical activity. This is called INTERMITTENT CLAUDICATION. This may present as cramping or numbness with physical activity. The location of the pain is associated with the level of blockage. For example, blockage at the abdominal level (distal abdominal aorta) may result in buttock or hip pain. Lower leg arterial blockage may result in calf pain.  As PVD becomes more severe, pain can develop with less physical activity.  In people with severe PVD, leg pain may occur at rest.  Other PVD signs and symptoms:  Leg numbness or weakness.  Coldness in the affected leg or foot, especially when compared to the other leg.  A change in leg color.  Patients with significant PVD are more prone to ulcers or sores on toes, feet or legs. These may take longer to heal or may reoccur. The ulcers or sores can become infected.  If signs and symptoms of PVD are ignored, gangrene may occur. This can result in the loss of toes or loss of an entire limb.  Not all leg pain is related to PVD. Other medical conditions can cause leg pain such as:  Blood clots (embolism) or Deep Vein Thrombosis.  Inflammation of the blood vessels (vasculitis).  Spinal stenosis. DIAGNOSIS  Diagnosis of PVD can involve several different types of tests. These can include:  Pulse Volume Recording Method (PVR). This test is simple, painless and does not involve the use of X-rays. PVR involves measuring and comparing the blood pressure in the arms and legs. An ABI (Ankle-Brachial Index) is calculated. The normal ratio of blood pressures is 1. As this number becomes smaller, it indicates more severe disease.  < 0.95  indicates significant narrowing in one or more leg  vessels.  <0.8 there will usually be pain in the foot, leg or buttock with exercise.  <0.4 will usually have pain in the legs at rest.  <0.25  usually indicates limb threatening PVD.  Doppler detection of pulses in the legs. This test is painless and checks to see if you have a pulses in your legs/feet.  A dye or contrast material (a substance that highlights the blood vessels so they show up on x-ray) may be given to help your caregiver better see the arteries for the following tests. The dye is eliminated from your body by the kidney's. Your caregiver may order blood work to check your kidney function and other laboratory values before the following tests are performed:  Magnetic Resonance Angiography (MRA). An MRA is a picture study of the blood vessels and arteries. The MRA machine uses a large magnet to produce images of the blood vessels.  Computed Tomography Angiography (CTA). A CTA is a specialized x-ray that looks at how the blood flows in your blood vessels. An IV may be inserted into your arm so contrast dye can be injected.  Angiogram. Is a procedure that uses x-rays to look at your blood vessels. This procedure is minimally invasive, meaning a small incision (cut) is made in your groin. A small tube (catheter) is then inserted into the artery   of your groin. The catheter is guided to the blood vessel or artery your caregiver wants to examine. Contrast dye is injected into the catheter. X-rays are then taken of the blood vessel or artery. After the images are obtained, the catheter is taken out. TREATMENT  Treatment of PVD involves many interventions which may include:  Lifestyle changes:  Quitting smoking.  Exercise.  Following a low fat, low cholesterol diet.  Control of diabetes.  Foot care is very important to the PVD patient. Good foot care can help prevent infection.  Medication:  Cholesterol-lowering medicine.  Blood pressure medicine.  Anti-platelet  drugs.  Certain medicines may reduce symptoms of Intermittent Claudication.  Interventional/Surgical options:  Angioplasty. An Angioplasty is a procedure that inflates a balloon in the blocked artery. This opens the blocked artery to improve blood flow.  Stent Implant. A wire mesh tube (stent) is placed in the artery. The stent expands and stays in place, allowing the artery to remain open.  Peripheral Bypass Surgery. This is a surgical procedure that reroutes the blood around a blocked artery to help improve blood flow. This type of procedure may be performed if Angioplasty or stent implants are not an option. SEEK IMMEDIATE MEDICAL CARE IF:   You develop pain or numbness in your arms or legs.  Your arm or leg turns cold, becomes blue in color.  You develop redness, warmth, swelling and pain in your arms or legs. MAKE SURE YOU:   Understand these instructions.  Will watch your condition.  Will get help right away if you are not doing well or get worse. Document Released: 05/05/2004 Document Revised: 06/20/2011 Document Reviewed: 04/01/2008 ExitCare Patient Information 2014 [employer].  

## 2013-02-14 ENCOUNTER — Other Ambulatory Visit: Payer: Self-pay

## 2013-03-27 ENCOUNTER — Ambulatory Visit: Payer: Medicare Other | Admitting: Cardiology

## 2013-04-17 ENCOUNTER — Ambulatory Visit: Payer: Medicare Other | Admitting: Adult Health

## 2013-04-23 ENCOUNTER — Ambulatory Visit (INDEPENDENT_AMBULATORY_CARE_PROVIDER_SITE_OTHER): Payer: Medicare Other | Admitting: Adult Health

## 2013-04-23 ENCOUNTER — Encounter: Payer: Self-pay | Admitting: Adult Health

## 2013-04-23 VITALS — BP 176/72 | HR 80 | Ht 62.0 in | Wt 98.0 lb

## 2013-04-23 DIAGNOSIS — E785 Hyperlipidemia, unspecified: Secondary | ICD-10-CM

## 2013-04-23 DIAGNOSIS — I7090 Unspecified atherosclerosis: Secondary | ICD-10-CM

## 2013-04-23 DIAGNOSIS — I10 Essential (primary) hypertension: Secondary | ICD-10-CM

## 2013-04-23 DIAGNOSIS — I2510 Atherosclerotic heart disease of native coronary artery without angina pectoris: Secondary | ICD-10-CM

## 2013-04-23 MED ORDER — POTASSIUM CHLORIDE ER 10 MEQ PO TBCR: 10.0000 meq | EXTENDED_RELEASE_TABLET | ORAL | Status: DC | PRN

## 2013-04-23 NOTE — Assessment & Plan Note (Signed)
Due to myalgias, I will have her hold simvastatin for a couple of weeks in order to ascertain if she is experiencing this due to the medications. We will follow up with her in about a month with Dr. Bronson Ing.

## 2013-04-23 NOTE — Assessment & Plan Note (Signed)
BP is slightly elevated, today, but I will not make any changes at this time. Will continue losartan 100 mg daily as directed.

## 2013-04-23 NOTE — Progress Notes (Signed)
HPI: Kayla Lane is an 80 year old former patient of Dr. Lattie Haw we are following for ongoing assessment and management of CAD, hypertension, with history of COPD. She was last seen in our office in October of 2014. Unfortunately,  she continues to suffer from COPD symptoms. She had no cardiac complaints at that time. She was advised to followup with Dr. Luan Pulling for ongoing management of this. Pressure was low normal. She was advised if she began to feel dizzy but would decrease her Cozaar from 100 mg daily to 50 mg daily.   She comes today with complaints of muscle aches and pain in her legs and ankles, with occasional edema. She takes lasix every few weeks for this. She has a lot of bruising as well. Denies chest pain, or dizziness,        Allergies  Allergen Reactions  . Iodinated Diagnostic Agents   . Penicillins     REACTION: Unknown reaction  . Sulfonamide Derivatives     REACTION: Unknown reaction  . Tetracycline     REACTION: Unknown reaction    Current Outpatient Prescriptions  Medication Sig Dispense Refill  . albuterol (PROVENTIL HFA;VENTOLIN HFA) 108 (90 BASE) MCG/ACT inhaler 1 puff in the am and 1 puff in the evening.  1 Inhaler  2  . ammonium lactate (AMLACTIN) 12 % cream       . aspirin EC 81 MG tablet Take 81 mg by mouth at bedtime.        . benzonatate (TESSALON) 100 MG capsule Take 100 mg by mouth 3 (three) times daily as needed. For cough      . Cyanocobalamin (VITAMIN B-12 CR PO) Take 1 tablet by mouth daily.      . Cyanocobalamin (VITAMIN B-12 IJ) Inject as directed every 30 (thirty) days.      . furosemide (LASIX) 20 MG tablet Take 20 mg by mouth as needed.       Marland Kitchen HYDROcodone-acetaminophen (NORCO) 5-325 MG per tablet Take 1 tablet by mouth every 6 (six) hours as needed. For pain       . losartan (COZAAR) 100 MG tablet Take 0.5 tablets (50 mg total) by mouth daily. TAKE (1/2) TABLET BY MOUTH DAILY.  15 tablet  5  . mupirocin ointment (BACTROBAN) 2 %       .  nitroGLYCERIN (NITROSTAT) 0.4 MG SL tablet Place 1 tablet (0.4 mg total) under the tongue every 5 (five) minutes as needed.  25 tablet  3  . PREVACID 24HR 15 MG capsule TAKE ONE CAPSULE DAILY.  28 capsule  5  . Probiotic Product (ALIGN PO) Take by mouth daily.      . simvastatin (ZOCOR) 40 MG tablet TAKE ONE TABLET AT BEDTIME.  30 tablet  6   No current facility-administered medications for this visit.    Past Medical History  Diagnosis Date  . Arteriosclerotic cardiovascular disease (ASCVD) 1994     Stent to RCA and LAD at [facility] in 1994; neg. stress nuclear 12/98; 04/2005:40% LAD; 70% CX;  echocardiogram in 2009-normal EF; mild to moderate MR.; 01/2011  Non-ST segment elevation MI->  BMS to the distal LAD ,40% in-stent stenosis in proximal LAD, 90% proximal OM1, 40-50% in-stent RCA stenosis, 50% PDA  . Cerebrovascular disease     bilateral bruits; 7/08-50-69% right and left internal carotid artery stenosis;  2011: 70-80% right; 50% left   . Hypertension   . Hyperlipidemia   . Spinal stenosis   . Colitis 2011  2011  . Degenerative joint disease   . Gastroesophageal reflux disease 2002    Hiatal hernia; dysphasia due to cervical web-dilated in 2002  . Colon carcinoma 1996    left hemicolectomy  . Weight loss 2009    30 pounds 2009-2011  . Mycobacterium avium-intracellulare infection   . Lung nodule 01/2011    right lower lobe; re-image in 08/2011  . Pneumonia   . Myocardial infarction     Heart Attack  . Osteoarthritis 02/10/2012  . Back pain October 04, 2012    Pt. fell outside    Past Surgical History  Procedure Laterality Date  . Appendectomy    . Partial colectomy  1996    Left for carcinoma  . Kyphosis surgery      Percutaneous  . Tonsillectomy    . Colonoscopy  01/2009; 11/2009    Dr. Rourk-->surgical anastomosis at 18cm. Residual rectal and colonic mucosa appeared normal; Dr. [name] Frieze papilla, surgical anastomosis at 18cm  . Esophagogastroduodenoscopy  01/2009;  11/2009    Dr. Daiva Nakayama cervical esophageal web s/p dilation, small hh; Dr. Trevor Iha hh  . Coronary stent placement  02/02/11  . Bladder suspension    . Cataract extraction      Bilateral  . Fracture surgery  10-04-12    Fx back T-12 and L-1  . Total abdominal hysterectomy  1974  . Eye surgery      TMA:UQJFHL of systems complete and found to be negative unless listed above  PHYSICAL EXAM Ht 5\' 2"  (1.575 m)  Wt 98 lb (44.453 kg)  BMI 17.92 kg/m2  General: Well developed, well nourished, in no acute distress Head: Eyes PERRLA, No xanthomas.   Normal cephalic and atramatic  Lungs: Clear bilaterally to auscultation and percussion. Heart: HRRR S1 S2, without MRG.  Pulses are 2+ & equal.            No carotid bruit. No JVD.  No abdominal bruits. No femoral bruits. Abdomen: Bowel sounds are positive, abdomen soft and non-tender without masses or Hernia's noted. Msk:  Back normal, slow but normal gait. Normal strength and tone for age. Extremities: No clubbing, cyanosis or edema.  Varicosities and some bruising.DP +1 Neuro: Alert and oriented X 3. Psych:  Good affect, responds appropriately  :  ASSESSMENT AND PLAN

## 2013-04-23 NOTE — Assessment & Plan Note (Signed)
Due to significant bruising in the LE, I will decrease baby ASA to QOD as this is very concerning to her. It may help improve some of the bruising.

## 2013-04-23 NOTE — Progress Notes (Signed)
Name: Kayla Lane    DOB: Apr 06, 1926  Age: 80 y.o.  MR#: [id number]       PCP:  Alonza Bogus, MD      Insurance: Payor: [place] / Plan: BLUE MEDICARE / Product Type: *No Product type* /   CC:    Chief Complaint  Patient presents with  . Coronary Artery Disease  . Hypertension    VS Filed Vitals:   04/23/13 1310  BP: 176/72  Pulse: 80  Height: 5\' 2"  (1.575 m)  Weight: 98 lb (44.453 kg)    Weights Current Weight  04/23/13 98 lb (44.453 kg)  02/07/13 102 lb 8 oz (46.494 kg)  01/17/13 100 lb (45.36 kg)    Blood Pressure  BP Readings from Last 3 Encounters:  04/23/13 176/72  02/07/13 158/75  01/17/13 112/68     Admit date:  (Not on file) Last encounter with RMR:  01/17/2013   Allergy Iodinated diagnostic agents; Penicillins; Sulfonamide derivatives; and Tetracycline  Current Outpatient Prescriptions  Medication Sig Dispense Refill  . albuterol (PROVENTIL HFA;VENTOLIN HFA) 108 (90 BASE) MCG/ACT inhaler 1 puff in the am and 1 puff in the evening.  1 Inhaler  2  . ammonium lactate (AMLACTIN) 12 % cream       . aspirin EC 81 MG tablet Take 81 mg by mouth at bedtime.        . benzonatate (TESSALON) 100 MG capsule Take 100 mg by mouth 3 (three) times daily as needed. For cough      . Cyanocobalamin (VITAMIN B-12 CR PO) Take 1 tablet by mouth daily.      . Cyanocobalamin (VITAMIN B-12 IJ) Inject as directed every 30 (thirty) days.      . furosemide (LASIX) 20 MG tablet Take 20 mg by mouth as needed.       Marland Kitchen HYDROcodone-acetaminophen (NORCO) 5-325 MG per tablet Take 1 tablet by mouth every 6 (six) hours as needed. For pain       . losartan (COZAAR) 100 MG tablet Take 0.5 tablets (50 mg total) by mouth daily. TAKE (1/2) TABLET BY MOUTH DAILY.  15 tablet  5  . mupirocin ointment (BACTROBAN) 2 %       . nitroGLYCERIN (NITROSTAT) 0.4 MG SL tablet Place 1 tablet (0.4 mg total) under the tongue every 5 (five) minutes as needed.  25 tablet  3  .  PREVACID 24HR 15 MG capsule TAKE ONE CAPSULE DAILY.  28 capsule  5  . Probiotic Product (ALIGN PO) Take by mouth daily.      . simvastatin (ZOCOR) 40 MG tablet TAKE ONE TABLET AT BEDTIME.  30 tablet  6   No current facility-administered medications for this visit.    Discontinued Meds:    Medications Discontinued During This Encounter  Medication Reason  . hydrochlorothiazide (MICROZIDE) 12.5 MG capsule Error    Patient Active Problem List   Diagnosis Date Noted  . Occlusion and stenosis of carotid artery without mention of cerebral infarction 02/07/2013  . COPD (chronic obstructive pulmonary disease) 01/17/2013  . Osteoarthritis 02/10/2012  . Bronchiectasis 02/10/2012  . Arteriosclerotic cardiovascular disease (ASCVD)   . Hypertension   . Gastroesophageal reflux disease   . Colon carcinoma   . Weight loss   . Mycobacterium avium-intracellulare infection   . Subclinical hypothyroidism 02/01/2011  . Anemia, chronic disease 01/31/2011  . Chronic kidney disease (CKD), stage III (moderate) 01/31/2011  . Hyperlipidemia 01/30/2011  . Lung nodule 01/10/2011  .  CEREBROVASCULAR DISEASE 12/25/2009    LABS    Component Value Date/Time   NA 135 01/08/2013 1650   NA 135 05/28/2012 1400   NA 141 02/13/2012 1625   K 4.2 01/08/2013 1650   K 4.3 05/28/2012 1400   K 4.6 02/13/2012 1625   CL 102 01/08/2013 1650   CL 100 05/28/2012 1400   CL 104 02/13/2012 1625   CO2 23 01/08/2013 1650   CO2 28 05/28/2012 1400   CO2 27 02/13/2012 1625   GLUCOSE 97 01/08/2013 1650   GLUCOSE 87 05/28/2012 1400   GLUCOSE 81 02/13/2012 1625   BUN 30* 01/08/2013 1650   BUN 13 05/28/2012 1400   BUN 15 02/13/2012 1625   CREATININE 1.21* 01/08/2013 1650   CREATININE 0.77 05/28/2012 1400   CREATININE 0.69 02/13/2012 1625   CREATININE 0.76 02/07/2011 0715   CREATININE 0.77 02/06/2011 0525   CREATININE 0.70 02/05/2011 1353   CALCIUM 9.8 01/08/2013 1650   CALCIUM 9.7 05/28/2012 1400   CALCIUM 9.6 02/13/2012 1625   GFRNONAA  76* 02/07/2011 0715   GFRNONAA 76* 02/06/2011 0525   GFRNONAA 78* 02/05/2011 1353   GFRAA 88* 02/07/2011 0715   GFRAA 88* 02/06/2011 0525   GFRAA >90 02/05/2011 1353   CMP     Component Value Date/Time   NA 135 01/08/2013 1650   K 4.2 01/08/2013 1650   CL 102 01/08/2013 1650   CO2 23 01/08/2013 1650   GLUCOSE 97 01/08/2013 1650   BUN 30* 01/08/2013 1650   CREATININE 1.21* 01/08/2013 1650   CREATININE 0.76 02/07/2011 0715   CALCIUM 9.8 01/08/2013 1650   PROT 6.6 05/28/2012 1400   ALBUMIN 4.4 05/28/2012 1400   AST 20 05/28/2012 1400   ALT <8 05/28/2012 1400   ALKPHOS 86 05/28/2012 1400   BILITOT 0.5 05/28/2012 1400   GFRNONAA 76* 02/07/2011 0715   GFRAA 88* 02/07/2011 0715       Component Value Date/Time   WBC 6.9 06/01/2011 0900   WBC 5.6 02/07/2011 0715   WBC 6.6 02/06/2011 0525   HGB 12.9 06/01/2011 0900   HGB 9.6* 02/07/2011 0715   HGB 9.2* 02/06/2011 0525   HCT 41.4 06/01/2011 0900   HCT 28.7* 02/07/2011 0715   HCT 27.0* 02/06/2011 0525   MCV 96.3 06/01/2011 0900   MCV 96.3 02/07/2011 0715   MCV 94.4 02/06/2011 0525    Lipid Panel     Component Value Date/Time   CHOL 166 06/01/2011 0900   TRIG 98 06/01/2011 0900   HDL 66 06/01/2011 0900   CHOLHDL 2.5 06/01/2011 0900   VLDL 20 06/01/2011 0900   LDLCALC 80 06/01/2011 0900    ABG No results found for this basename: phart, pco2, pco2art, po2, po2art, hco3, tco2, acidbasedef, o2sat     Lab Results  Component Value Date   TSH 5.756* 01/30/2011   BNP (last 3 results) No results found for this basename: PROBNP,  in the last 8760 hours Cardiac Panel (last 3 results) No results found for this basename: CKTOTAL, CKMB, TROPONINI, RELINDX,  in the last 72 hours  Iron/TIBC/Ferritin    Component Value Date/Time   IRON 48 01/09/2011 1619   TIBC 224* 01/09/2011 1619   FERRITIN 202 01/09/2011 1619     EKG Orders placed in visit on 12/05/12  . EKG 12-LEAD     Prior Assessment and Plan Problem List as of 04/23/2013    CEREBROVASCULAR DISEASE   Last Assessment & Plan   05/28/2012 Office Visit Edited 05/31/2012  6:12 PM by Yehuda Savannah, MD     Significant obstructive right carotid disease in the past, which is managed by Dr. Oneida Alar.    Hyperlipidemia   Last Assessment & Plan   05/28/2012 Office Visit Written 05/28/2012  3:03 PM by Yehuda Savannah, MD     Lipid profile performed last year was excellent; current lipid lowering therapy will be continued.    Anemia, chronic disease   Last Assessment & Plan   05/28/2012 Office Visit Written 05/28/2012  3:00 PM by Yehuda Savannah, MD     CBC was reassessed last year and was normal. It appears that the anemia has resolved without a specific diagnosis being established.    Chronic kidney disease (CKD), stage III (moderate)   Last Assessment & Plan   11/25/2011 Office Visit Edited 11/25/2011  9:52 PM by Yehuda Savannah, MD     No apparent significant renal disease with a recent creatinine in 05/2011 of 0.83 and values well less than 1.0 for at least the past 2 years.    Subclinical hypothyroidism   Last Assessment & Plan   05/28/2012 Office Visit Written 05/28/2012  2:05 PM by Yehuda Savannah, MD     Slightly elevated TSH with normal free T3 and T4 18 months ago; no current symptoms of hypothyroidism.    Arteriosclerotic cardiovascular disease (ASCVD)   Last Assessment & Plan   01/17/2013 Office Visit Written 01/17/2013  4:43 PM by Lendon Colonel, NP     Wheezing and dyspnea are not related to cardiac etiology at this time. She will continue on her current medication regimen without beta blocker HCTZ presently. There is no evidence of CHF or complaints of chest pain.    Hypertension   Last Assessment & Plan   01/17/2013 Office Visit Written 01/17/2013  4:44 PM by Lendon Colonel, NP     Blood pressure is low normal. She remains on Cozaar 100 mg (one half tablet) daily for total of 50 mg daily. I have told her that if she begins still dizzy or  lightheaded to please let us know and we may need to adjust her dosing. Her blood pressures dropped considerably since last evaluation on 01/08/2013 at 158/78 down to 112/68.    Gastroesophageal reflux disease   Colon carcinoma   Weight loss   Last Assessment & Plan   05/28/2012 Office Visit Written 05/28/2012  2:07 PM by Yehuda Savannah, MD     30 pound weight loss in 2010; weight subsequently stable.    Mycobacterium avium-intracellulare infection   Lung nodule   Last Assessment & Plan   12/05/2012 Office Visit Written 12/05/2012  7:45 PM by Yehuda Savannah, MD     The most recent Chest CT I can identify was performed in 2011.  In light of her multiple parenchymal abnormalities, Dr. Luan Pulling may wish to repeat that study.    Osteoarthritis   Bronchiectasis   Last Assessment & Plan   12/05/2012 Office Visit Edited 12/13/2012  2:42 PM by Yehuda Savannah, MD     Patient reports increased symptoms, which have previously been treated with course of antibiotics. Although her chart lists an unknown reaction to tetracycline, patient denies this and notes that she has been treated with similar medications in the past. I recommended a course of doxycycline, but patient preferred ciprofloxacin, which was provided to her.  She describes possible bronchospasm exacerbating her chronic lung disease. Treatment with beta blocker will be discontinued  to determine if this provides any benefit with respect to pulmonary symptoms.    COPD (chronic obstructive pulmonary disease)   Last Assessment & Plan   01/17/2013 Office Visit Edited 01/17/2013  4:42 PM by Lendon Colonel, NP     The patient has no evidence of CHF, and therefore do not feel that wheezing is associated with this. COPD continues to be an ongoing issue for her. She is not on any breathing treatments, inhalers, or steroids at this time. Dr. Luan Pulling, her primary care physician, is unavailable at this time as he is on vacation. I prescribed her  Proventil inhaler one puff in the morning and one puff in the evening. She will need to have close followup with Dr. Luan Pulling for ongoing treatment in and management. We will continue to keep her off beta blockers for now.    Occlusion and stenosis of carotid artery without mention of cerebral infarction       Imaging: No results found.

## 2013-04-23 NOTE — Patient Instructions (Addendum)
Your physician recommends that you schedule a follow-up appointment in: ONE MONTH WITH Dr. Bronson Ing   Your physician has recommended you make the following change in your medication:  1) START TAKING YOUR ASPIRIN 81MG  EVERY OTHER DAY 2) STOP TAKING YOUR SIMVASTATIN TO SEE IF THIS WILL HELP MUSCLE ACHES 3) START TAKING POTASSIUM 10MEQ WITH YOUR LASIX ON THE DAYS YOU TAKE YOUR LASIX

## 2013-05-27 ENCOUNTER — Ambulatory Visit: Payer: Medicare Other | Admitting: Cardiovascular Disease

## 2013-06-11 NOTE — Progress Notes (Signed)
HPI: Kayla Lane is an 80 year old patient to be est. with Dr. Pennelope Bracken were following for ongoing assessment and management of CAD, hypertension, with history of COPD. His last in our office in January of 2015. She complained of lower extremity edema with multiple aches and pains in her lower extremities as well.   The patient was told to stop taking simvastatin for approximately one week to see if this helps with her muscle aches and pains. She was started on potassium 10 mEq with her Lasix on the days that she takes lasix and she only takes it a few days a week. She is here for followup of her symptoms.    She is recovering from bronchitis and is being treated with antibiotics by Dr. Luan Pulling. She continues to have non-productive coughing. She feels overall weak from the illness and antibiotics. She has 3 more days on the course. She has had no improvement in leg pain with temporary cessation of simvastatin.   Allergies  Allergen Reactions  . Iodinated Diagnostic Agents   . Penicillins     REACTION: Unknown reaction  . Sulfonamide Derivatives     REACTION: Unknown reaction  . Tetracycline     REACTION: Unknown reaction    Current Outpatient Prescriptions  Medication Sig Dispense Refill  . albuterol (PROVENTIL HFA;VENTOLIN HFA) 108 (90 BASE) MCG/ACT inhaler 1 puff in the am and 1 puff in the evening.  1 Inhaler  2  . ammonium lactate (AMLACTIN) 12 % cream       . aspirin EC 81 MG tablet Take 81 mg by mouth at bedtime.        . benzonatate (TESSALON) 100 MG capsule Take 100 mg by mouth 3 (three) times daily as needed. For cough      . ciprofloxacin (CIPRO) 500 MG tablet Take 500 mg by mouth 2 (two) times daily.      . Cyanocobalamin (VITAMIN B-12 IJ) Inject as directed every 30 (thirty) days.      Marland Kitchen HYDROcodone-acetaminophen (NORCO) 5-325 MG per tablet Take 1 tablet by mouth every 6 (six) hours as needed. For pain       . losartan (COZAAR) 100 MG tablet Take 0.5 tablets (50 mg total)  by mouth daily. TAKE (1/2) TABLET BY MOUTH DAILY.  15 tablet  5  . mupirocin ointment (BACTROBAN) 2 %       . nitroGLYCERIN (NITROSTAT) 0.4 MG SL tablet Place 1 tablet (0.4 mg total) under the tongue every 5 (five) minutes as needed.  25 tablet  3  . PREVACID 24HR 15 MG capsule TAKE ONE CAPSULE DAILY.  28 capsule  5  . Probiotic Product (ALIGN PO) Take by mouth daily.      . simvastatin (ZOCOR) 40 MG tablet TAKE ONE TABLET AT BEDTIME.  30 tablet  6   No current facility-administered medications for this visit.    Past Medical History  Diagnosis Date  . Arteriosclerotic cardiovascular disease (ASCVD) 1994     Stent to RCA and LAD at [facility] in 1994; neg. stress nuclear 12/98; 04/2005:40% LAD; 70% CX;  echocardiogram in 2009-normal EF; mild to moderate MR.; 01/2011  Non-ST segment elevation MI->  BMS to the distal LAD ,40% in-stent stenosis in proximal LAD, 90% proximal OM1, 40-50% in-stent RCA stenosis, 50% PDA  . Cerebrovascular disease     bilateral bruits; 7/08-50-69% right and left internal carotid artery stenosis;  2011: 70-80% right; 50% left   . Hypertension   . Hyperlipidemia   .  Spinal stenosis   . Colitis 2011    2011  . Degenerative joint disease   . Gastroesophageal reflux disease 2002    Hiatal hernia; dysphasia due to cervical web-dilated in 2002  . Colon carcinoma 1996    left hemicolectomy  . Weight loss 2009    30 pounds 2009-2011  . Mycobacterium avium-intracellulare infection   . Lung nodule 01/2011    right lower lobe; re-image in 08/2011  . Pneumonia   . Myocardial infarction     Heart Attack  . Osteoarthritis 02/10/2012  . Back pain October 04, 2012    Pt. fell outside    Past Surgical History  Procedure Laterality Date  . Appendectomy    . Partial colectomy  1996    Left for carcinoma  . Kyphosis surgery      Percutaneous  . Tonsillectomy    . Colonoscopy  01/2009; 11/2009    Dr. Rourk-->surgical anastomosis at 18cm. Residual rectal and colonic mucosa  appeared normal; Dr. [name] Frieze papilla, surgical anastomosis at 18cm  . Esophagogastroduodenoscopy  01/2009; 11/2009    Dr. Daiva Nakayama cervical esophageal web s/p dilation, small hh; Dr. Trevor Iha hh  . Coronary stent placement  02/02/11  . Bladder suspension    . Cataract extraction      Bilateral  . Fracture surgery  10-04-12    Fx back T-12 and L-1  . Total abdominal hysterectomy  1974  . Eye surgery      ROS: Review of systems complete and found to be negative unless listed above  PHYSICAL EXAM BP 130/72  Pulse 69  Ht 5\' 2"  (1.575 m)  Wt 99 lb (44.906 kg)  BMI 18.10 kg/m2  General: Well developed, well nourished, in no acute distress. Bruise on underside of right chin. Head: Eyes PERRLA, No xanthomas.   Normal cephalic and atramatic  Lungs: Some inspiratory wheezes with bibasilar crackles. Coughing with deep inspiration. Heart: HRRR S1 S2, without MRG.  Pulses are 2+ & equal.            No carotid bruit. No JVD.  No abdominal bruits. No femoral bruits. Abdomen: Bowel sounds are positive, abdomen soft and non-tender without masses or                  Hernia's noted. Msk:  Back normal, normal gait. Normal strength and tone for age. Extremities: No clubbing, cyanosis or edema.  DP +1 Neuro: Alert and oriented X 3. Psych:  Good affect, responds appropriately    ASSESSMENT AND PLAN

## 2013-06-12 ENCOUNTER — Ambulatory Visit (INDEPENDENT_AMBULATORY_CARE_PROVIDER_SITE_OTHER): Payer: Medicare Other | Admitting: Adult Health

## 2013-06-12 ENCOUNTER — Encounter: Payer: Self-pay | Admitting: Adult Health

## 2013-06-12 VITALS — BP 130/72 | HR 69 | Ht 62.0 in | Wt 99.0 lb

## 2013-06-12 DIAGNOSIS — I10 Essential (primary) hypertension: Secondary | ICD-10-CM

## 2013-06-12 DIAGNOSIS — I7090 Unspecified atherosclerosis: Secondary | ICD-10-CM

## 2013-06-12 DIAGNOSIS — I2510 Atherosclerotic heart disease of native coronary artery without angina pectoris: Secondary | ICD-10-CM

## 2013-06-12 DIAGNOSIS — D638 Anemia in other chronic diseases classified elsewhere: Secondary | ICD-10-CM

## 2013-06-12 DIAGNOSIS — E785 Hyperlipidemia, unspecified: Secondary | ICD-10-CM

## 2013-06-12 NOTE — Assessment & Plan Note (Signed)
No chest pain . Overall weakness from antibiotics and bronchitis with associated shortness of breath.

## 2013-06-12 NOTE — Assessment & Plan Note (Signed)
Blood pressure is well controlled. She is without complaints of chest pain or dizziness. Will continue current medications as directed.

## 2013-06-12 NOTE — Assessment & Plan Note (Signed)
She will be restarted on simvastatin for cardiovascular protection, as leg pains were not related to statin.

## 2013-06-12 NOTE — Progress Notes (Deleted)
Name: Kayla Lane    DOB: Jul 16, 1925  Age: 80 y.o.  MR#: [id number]       PCP:  Alonza Bogus, MD      Insurance: Payor: [employer] / Plan: BLUE MEDICARE / Product Type: *No Product type* /   CC:    Chief Complaint  Patient presents with  . Coronary Artery Disease  . Hypertension    VS Filed Vitals:   06/12/13 1337  BP: 95/62  Pulse: 69  Height: 5\' 2"  (1.575 m)  Weight: 99 lb (44.906 kg)    Weights Current Weight  06/12/13 99 lb (44.906 kg)  04/23/13 98 lb (44.453 kg)  02/07/13 102 lb 8 oz (46.494 kg)    Blood Pressure  BP Readings from Last 3 Encounters:  06/12/13 95/62  04/23/13 176/72  02/07/13 158/75     Admit date:  (Not on file) Last encounter with RMR:  04/23/2013   Allergy Iodinated diagnostic agents; Penicillins; Sulfonamide derivatives; and Tetracycline  Current Outpatient Prescriptions  Medication Sig Dispense Refill  . albuterol (PROVENTIL HFA;VENTOLIN HFA) 108 (90 BASE) MCG/ACT inhaler 1 puff in the am and 1 puff in the evening.  1 Inhaler  2  . ammonium lactate (AMLACTIN) 12 % cream       . aspirin EC 81 MG tablet Take 81 mg by mouth at bedtime.        . benzonatate (TESSALON) 100 MG capsule Take 100 mg by mouth 3 (three) times daily as needed. For cough      . ciprofloxacin (CIPRO) 500 MG tablet Take 500 mg by mouth 2 (two) times daily.      . Cyanocobalamin (VITAMIN B-12 IJ) Inject as directed every 30 (thirty) days.      Marland Kitchen HYDROcodone-acetaminophen (NORCO) 5-325 MG per tablet Take 1 tablet by mouth every 6 (six) hours as needed. For pain       . losartan (COZAAR) 100 MG tablet Take 0.5 tablets (50 mg total) by mouth daily. TAKE (1/2) TABLET BY MOUTH DAILY.  15 tablet  5  . mupirocin ointment (BACTROBAN) 2 %       . nitroGLYCERIN (NITROSTAT) 0.4 MG SL tablet Place 1 tablet (0.4 mg total) under the tongue every 5 (five) minutes as needed.  25 tablet  3  . PREVACID 24HR 15 MG capsule TAKE ONE CAPSULE DAILY.  28 capsule  5   . Probiotic Product (ALIGN PO) Take by mouth daily.      . simvastatin (ZOCOR) 40 MG tablet TAKE ONE TABLET AT BEDTIME.  30 tablet  6   No current facility-administered medications for this visit.    Discontinued Meds:    Medications Discontinued During This Encounter  Medication Reason  . Cyanocobalamin (VITAMIN B-12 CR PO) Error  . potassium chloride (K-DUR) 10 MEQ tablet Error  . furosemide (LASIX) 20 MG tablet Error    Patient Active Problem List   Diagnosis Date Noted  . Occlusion and stenosis of carotid artery without mention of cerebral infarction 02/07/2013  . COPD (chronic obstructive pulmonary disease) 01/17/2013  . Osteoarthritis 02/10/2012  . Bronchiectasis 02/10/2012  . Arteriosclerotic cardiovascular disease (ASCVD)   . Hypertension   . Gastroesophageal reflux disease   . Colon carcinoma   . Weight loss   . Mycobacterium avium-intracellulare infection   . Subclinical hypothyroidism 02/01/2011  . Anemia, chronic disease 01/31/2011  . Chronic kidney disease (CKD), stage III (moderate) 01/31/2011  . Hyperlipidemia 01/30/2011  . Lung nodule 01/10/2011  .  CEREBROVASCULAR DISEASE 12/25/2009    LABS    Component Value Date/Time   NA 135 01/08/2013 1650   NA 135 05/28/2012 1400   NA 141 02/13/2012 1625   K 4.2 01/08/2013 1650   K 4.3 05/28/2012 1400   K 4.6 02/13/2012 1625   CL 102 01/08/2013 1650   CL 100 05/28/2012 1400   CL 104 02/13/2012 1625   CO2 23 01/08/2013 1650   CO2 28 05/28/2012 1400   CO2 27 02/13/2012 1625   GLUCOSE 97 01/08/2013 1650   GLUCOSE 87 05/28/2012 1400   GLUCOSE 81 02/13/2012 1625   BUN 30* 01/08/2013 1650   BUN 13 05/28/2012 1400   BUN 15 02/13/2012 1625   CREATININE 1.21* 01/08/2013 1650   CREATININE 0.77 05/28/2012 1400   CREATININE 0.69 02/13/2012 1625   CREATININE 0.76 02/07/2011 0715   CREATININE 0.77 02/06/2011 0525   CREATININE 0.70 02/05/2011 1353   CALCIUM 9.8 01/08/2013 1650   CALCIUM 9.7 05/28/2012 1400   CALCIUM 9.6 02/13/2012 1625    GFRNONAA 76* 02/07/2011 0715   GFRNONAA 76* 02/06/2011 0525   GFRNONAA 78* 02/05/2011 1353   GFRAA 88* 02/07/2011 0715   GFRAA 88* 02/06/2011 0525   GFRAA >90 02/05/2011 1353   CMP     Component Value Date/Time   NA 135 01/08/2013 1650   K 4.2 01/08/2013 1650   CL 102 01/08/2013 1650   CO2 23 01/08/2013 1650   GLUCOSE 97 01/08/2013 1650   BUN 30* 01/08/2013 1650   CREATININE 1.21* 01/08/2013 1650   CREATININE 0.76 02/07/2011 0715   CALCIUM 9.8 01/08/2013 1650   PROT 6.6 05/28/2012 1400   ALBUMIN 4.4 05/28/2012 1400   AST 20 05/28/2012 1400   ALT <8 05/28/2012 1400   ALKPHOS 86 05/28/2012 1400   BILITOT 0.5 05/28/2012 1400   GFRNONAA 76* 02/07/2011 0715   GFRAA 88* 02/07/2011 0715       Component Value Date/Time   WBC 6.9 06/01/2011 0900   WBC 5.6 02/07/2011 0715   WBC 6.6 02/06/2011 0525   HGB 12.9 06/01/2011 0900   HGB 9.6* 02/07/2011 0715   HGB 9.2* 02/06/2011 0525   HCT 41.4 06/01/2011 0900   HCT 28.7* 02/07/2011 0715   HCT 27.0* 02/06/2011 0525   MCV 96.3 06/01/2011 0900   MCV 96.3 02/07/2011 0715   MCV 94.4 02/06/2011 0525    Lipid Panel     Component Value Date/Time   CHOL 166 06/01/2011 0900   TRIG 98 06/01/2011 0900   HDL 66 06/01/2011 0900   CHOLHDL 2.5 06/01/2011 0900   VLDL 20 06/01/2011 0900   LDLCALC 80 06/01/2011 0900    ABG No results found for this basename: phart, pco2, pco2art, po2, po2art, hco3, tco2, acidbasedef, o2sat     Lab Results  Component Value Date   TSH 5.756* 01/30/2011   BNP (last 3 results) No results found for this basename: PROBNP,  in the last 8760 hours Cardiac Panel (last 3 results) No results found for this basename: CKTOTAL, CKMB, TROPONINI, RELINDX,  in the last 72 hours  Iron/TIBC/Ferritin    Component Value Date/Time   IRON 48 01/09/2011 1619   TIBC 224* 01/09/2011 1619   FERRITIN 202 01/09/2011 1619     EKG Orders placed in visit on 12/05/12  . EKG 12-LEAD     Prior Assessment and Plan Problem List as of 06/12/2013      Cardiovascular and Mediastinum   CEREBROVASCULAR DISEASE   Last Assessment & Plan  05/28/2012 Office Visit Edited 05/31/2012  6:12 PM by Yehuda Savannah, MD     Significant obstructive right carotid disease in the past, which is managed by Dr. Oneida Alar.    Arteriosclerotic cardiovascular disease (ASCVD)   Last Assessment & Plan   04/23/2013 Office Visit Written 04/23/2013  1:38 PM by Lendon Colonel, NP     Due to significant bruising in the LE, I will decrease baby ASA to QOD as this is very concerning to her. It may help improve some of the bruising.    Hypertension   Last Assessment & Plan   04/23/2013 Office Visit Written 04/23/2013  1:37 PM by Lendon Colonel, NP     BP is slightly elevated, today, but I will not make any changes at this time. Will continue losartan 100 mg daily as directed.     Occlusion and stenosis of carotid artery without mention of cerebral infarction     Respiratory   Bronchiectasis   Last Assessment & Plan   12/05/2012 Office Visit Edited 12/13/2012  2:42 PM by Yehuda Savannah, MD     Patient reports increased symptoms, which have previously been treated with course of antibiotics. Although her chart lists an unknown reaction to tetracycline, patient denies this and notes that she has been treated with similar medications in the past. I recommended a course of doxycycline, but patient preferred ciprofloxacin, which was provided to her.  She describes possible bronchospasm exacerbating her chronic lung disease. Treatment with beta blocker will be discontinued to determine if this provides any benefit with respect to pulmonary symptoms.    COPD (chronic obstructive pulmonary disease)   Last Assessment & Plan   01/17/2013 Office Visit Edited 01/17/2013  4:42 PM by Lendon Colonel, NP     The patient has no evidence of CHF, and therefore do not feel that wheezing is associated with this. COPD continues to be an ongoing issue for her. She is not on any  breathing treatments, inhalers, or steroids at this time. Dr. Luan Pulling, her primary care physician, is unavailable at this time as he is on vacation. I prescribed her Proventil inhaler one puff in the morning and one puff in the evening. She will need to have close followup with Dr. Luan Pulling for ongoing treatment in and management. We will continue to keep her off beta blockers for now.      Digestive   Gastroesophageal reflux disease   Colon carcinoma     Endocrine   Subclinical hypothyroidism   Last Assessment & Plan   05/28/2012 Office Visit Written 05/28/2012  2:05 PM by Yehuda Savannah, MD     Slightly elevated TSH with normal free T3 and T4 18 months ago; no current symptoms of hypothyroidism.      Musculoskeletal and Integument   Osteoarthritis     Genitourinary   Chronic kidney disease (CKD), stage III (moderate)   Last Assessment & Plan   11/25/2011 Office Visit Edited 11/25/2011  9:52 PM by Yehuda Savannah, MD     No apparent significant renal disease with a recent creatinine in 05/2011 of 0.83 and values well less than 1.0 for at least the past 2 years.      Other   Hyperlipidemia   Last Assessment & Plan   04/23/2013 Office Visit Written 04/23/2013  1:40 PM by Lendon Colonel, NP     Due to myalgias, I will have her hold simvastatin for a couple of weeks in order to  ascertain if she is experiencing this due to the medications. We will follow up with her in about a month with Dr. Bronson Ing.     Anemia, chronic disease   Last Assessment & Plan   05/28/2012 Office Visit Written 05/28/2012  3:00 PM by Yehuda Savannah, MD     CBC was reassessed last year and was normal. It appears that the anemia has resolved without a specific diagnosis being established.    Weight loss   Last Assessment & Plan   05/28/2012 Office Visit Written 05/28/2012  2:07 PM by Yehuda Savannah, MD     30 pound weight loss in 2010; weight subsequently stable.    Mycobacterium avium-intracellulare  infection   Lung nodule   Last Assessment & Plan   12/05/2012 Office Visit Written 12/05/2012  7:45 PM by Yehuda Savannah, MD     The most recent Chest CT I can identify was performed in 2011.  In light of her multiple parenchymal abnormalities, Dr. Luan Pulling may wish to repeat that study.        Imaging: No results found.

## 2013-06-12 NOTE — Assessment & Plan Note (Signed)
Followed by Dr. Hawkins. 

## 2013-06-12 NOTE — Patient Instructions (Addendum)
Your physician recommends that you schedule a follow-up appointment in: 6 months with Dr Virgina Jock will receive a reminder letter two months in advance reminding you to call and schedule your appointment. If you don't receive this letter, please contact our office.  Please Start back Simvastatin 40 mg daily

## 2013-06-18 ENCOUNTER — Other Ambulatory Visit (HOSPITAL_COMMUNITY): Payer: Self-pay | Admitting: Pulmonary Disease

## 2013-06-18 ENCOUNTER — Ambulatory Visit (HOSPITAL_COMMUNITY)
Admission: RE | Admit: 2013-06-18 | Discharge: 2013-06-18 | Disposition: A | Discharge status: Home / Self Care | Payer: Medicare Other | Source: Ambulatory Visit | Attending: Pulmonary Disease | Admitting: Pulmonary Disease

## 2013-06-18 DIAGNOSIS — J449 Chronic obstructive pulmonary disease, unspecified: Secondary | ICD-10-CM | POA: Insufficient documentation

## 2013-06-18 DIAGNOSIS — Z9861 Coronary angioplasty status: Secondary | ICD-10-CM | POA: Insufficient documentation

## 2013-06-18 DIAGNOSIS — R918 Other nonspecific abnormal finding of lung field: Secondary | ICD-10-CM | POA: Insufficient documentation

## 2013-06-18 DIAGNOSIS — I252 Old myocardial infarction: Secondary | ICD-10-CM | POA: Insufficient documentation

## 2013-06-18 DIAGNOSIS — R079 Chest pain, unspecified: Secondary | ICD-10-CM | POA: Insufficient documentation

## 2013-06-18 DIAGNOSIS — I2510 Atherosclerotic heart disease of native coronary artery without angina pectoris: Secondary | ICD-10-CM | POA: Insufficient documentation

## 2013-06-18 DIAGNOSIS — R200 Anesthesia of skin: Secondary | ICD-10-CM

## 2013-06-18 DIAGNOSIS — J4489 Other specified chronic obstructive pulmonary disease: Secondary | ICD-10-CM | POA: Insufficient documentation

## 2013-06-18 DIAGNOSIS — Z85038 Personal history of other malignant neoplasm of large intestine: Secondary | ICD-10-CM | POA: Insufficient documentation

## 2013-06-18 DIAGNOSIS — R0789 Other chest pain: Secondary | ICD-10-CM

## 2013-06-20 ENCOUNTER — Other Ambulatory Visit: Payer: Self-pay | Admitting: Cardiology

## 2013-06-25 ENCOUNTER — Other Ambulatory Visit (HOSPITAL_COMMUNITY): Payer: Self-pay | Admitting: Pulmonary Disease

## 2013-06-25 DIAGNOSIS — R911 Solitary pulmonary nodule: Secondary | ICD-10-CM

## 2013-06-26 ENCOUNTER — Ambulatory Visit (HOSPITAL_COMMUNITY)
Admission: RE | Admit: 2013-06-26 | Discharge: 2013-06-26 | Disposition: A | Discharge status: Home / Self Care | Payer: Medicare Other | Source: Ambulatory Visit | Attending: Pulmonary Disease | Admitting: Pulmonary Disease

## 2013-06-26 ENCOUNTER — Encounter (HOSPITAL_COMMUNITY): Payer: Self-pay

## 2013-06-26 DIAGNOSIS — J479 Bronchiectasis, uncomplicated: Secondary | ICD-10-CM | POA: Insufficient documentation

## 2013-06-26 DIAGNOSIS — R634 Abnormal weight loss: Secondary | ICD-10-CM | POA: Insufficient documentation

## 2013-06-26 DIAGNOSIS — E278 Other specified disorders of adrenal gland: Secondary | ICD-10-CM | POA: Insufficient documentation

## 2013-06-26 DIAGNOSIS — R911 Solitary pulmonary nodule: Secondary | ICD-10-CM | POA: Insufficient documentation

## 2013-06-26 DIAGNOSIS — R9389 Abnormal findings on diagnostic imaging of other specified body structures: Secondary | ICD-10-CM | POA: Insufficient documentation

## 2013-06-26 DIAGNOSIS — I2584 Coronary atherosclerosis due to calcified coronary lesion: Secondary | ICD-10-CM | POA: Insufficient documentation

## 2013-06-26 MED ORDER — IOHEXOL 300 MG/ML  SOLN
80.0000 mL | Freq: Once | INTRAMUSCULAR | Status: AC | PRN
  Administered 2013-06-26: 80 mL via INTRAVENOUS

## 2013-06-27 ENCOUNTER — Telehealth: Payer: Self-pay | Admitting: Gastroenterology

## 2013-06-27 ENCOUNTER — Other Ambulatory Visit: Payer: Self-pay | Admitting: Gastroenterology

## 2013-06-27 DIAGNOSIS — R634 Abnormal weight loss: Secondary | ICD-10-CM

## 2013-06-27 NOTE — Telephone Encounter (Signed)
DR. Luan Pulling CALLED. PT NEEDS URGENT EGD. SCHEDULE PT FOR TOMORROW. FAX INFO TO DR. HAWKIN'S OFFICE. PT IS WAITING FOR INFORMATION.

## 2013-06-27 NOTE — Telephone Encounter (Signed)
EGD Is scheduled with [facility] on 03/20 at 12:00 and I have faxed instructions over to Dr. Luan Pulling

## 2013-06-28 ENCOUNTER — Encounter (HOSPITAL_COMMUNITY): Payer: Self-pay | Admitting: Emergency Medicine

## 2013-06-28 ENCOUNTER — Emergency Department (HOSPITAL_COMMUNITY): Payer: Medicare Other

## 2013-06-28 ENCOUNTER — Inpatient Hospital Stay (HOSPITAL_COMMUNITY)
Admission: EM | Admit: 2013-06-28 | Discharge: 2013-07-03 | DRG: 054 | Disposition: A | Discharge status: Hospice | Payer: Medicare Other | Attending: Internal Medicine | Admitting: Internal Medicine

## 2013-06-28 ENCOUNTER — Other Ambulatory Visit: Payer: Self-pay | Admitting: Neurosurgery

## 2013-06-28 ENCOUNTER — Other Ambulatory Visit: Payer: Self-pay

## 2013-06-28 ENCOUNTER — Encounter (HOSPITAL_COMMUNITY)
Admission: EM | Disposition: A | Discharge status: Hospice | Payer: Self-pay | Source: Home / Self Care | Attending: Internal Medicine

## 2013-06-28 ENCOUNTER — Inpatient Hospital Stay (HOSPITAL_COMMUNITY): Payer: Medicare Other

## 2013-06-28 ENCOUNTER — Ambulatory Visit (HOSPITAL_COMMUNITY): Admission: RE | Admit: 2013-06-28 | Payer: Medicare Other | Source: Ambulatory Visit | Admitting: Gastroenterology

## 2013-06-28 DIAGNOSIS — Z79899 Other long term (current) drug therapy: Secondary | ICD-10-CM | POA: Diagnosis not present

## 2013-06-28 DIAGNOSIS — Z9861 Coronary angioplasty status: Secondary | ICD-10-CM

## 2013-06-28 DIAGNOSIS — I252 Old myocardial infarction: Secondary | ICD-10-CM

## 2013-06-28 DIAGNOSIS — I10 Essential (primary) hypertension: Secondary | ICD-10-CM | POA: Diagnosis present

## 2013-06-28 DIAGNOSIS — R911 Solitary pulmonary nodule: Secondary | ICD-10-CM

## 2013-06-28 DIAGNOSIS — Z7982 Long term (current) use of aspirin: Secondary | ICD-10-CM | POA: Diagnosis not present

## 2013-06-28 DIAGNOSIS — C161 Malignant neoplasm of fundus of stomach: Secondary | ICD-10-CM | POA: Diagnosis present

## 2013-06-28 DIAGNOSIS — E43 Unspecified severe protein-calorie malnutrition: Secondary | ICD-10-CM | POA: Diagnosis present

## 2013-06-28 DIAGNOSIS — G8190 Hemiplegia, unspecified affecting unspecified side: Secondary | ICD-10-CM | POA: Diagnosis present

## 2013-06-28 DIAGNOSIS — G8191 Hemiplegia, unspecified affecting right dominant side: Secondary | ICD-10-CM

## 2013-06-28 DIAGNOSIS — C7931 Secondary malignant neoplasm of brain: Principal | ICD-10-CM | POA: Diagnosis present

## 2013-06-28 DIAGNOSIS — E785 Hyperlipidemia, unspecified: Secondary | ICD-10-CM | POA: Diagnosis present

## 2013-06-28 DIAGNOSIS — K219 Gastro-esophageal reflux disease without esophagitis: Secondary | ICD-10-CM | POA: Diagnosis present

## 2013-06-28 DIAGNOSIS — C7889 Secondary malignant neoplasm of other digestive organs: Secondary | ICD-10-CM | POA: Diagnosis present

## 2013-06-28 DIAGNOSIS — I658 Occlusion and stenosis of other precerebral arteries: Secondary | ICD-10-CM | POA: Diagnosis present

## 2013-06-28 DIAGNOSIS — J449 Chronic obstructive pulmonary disease, unspecified: Secondary | ICD-10-CM

## 2013-06-28 DIAGNOSIS — Z85038 Personal history of other malignant neoplasm of large intestine: Secondary | ICD-10-CM

## 2013-06-28 DIAGNOSIS — R29898 Other symptoms and signs involving the musculoskeletal system: Secondary | ICD-10-CM | POA: Diagnosis present

## 2013-06-28 DIAGNOSIS — R9431 Abnormal electrocardiogram [ECG] [EKG]: Secondary | ICD-10-CM

## 2013-06-28 DIAGNOSIS — D496 Neoplasm of unspecified behavior of brain: Secondary | ICD-10-CM

## 2013-06-28 DIAGNOSIS — I2510 Atherosclerotic heart disease of native coronary artery without angina pectoris: Secondary | ICD-10-CM | POA: Diagnosis present

## 2013-06-28 DIAGNOSIS — G936 Cerebral edema: Secondary | ICD-10-CM | POA: Diagnosis present

## 2013-06-28 DIAGNOSIS — C7949 Secondary malignant neoplasm of other parts of nervous system: Principal | ICD-10-CM

## 2013-06-28 DIAGNOSIS — R634 Abnormal weight loss: Secondary | ICD-10-CM

## 2013-06-28 DIAGNOSIS — G9389 Other specified disorders of brain: Secondary | ICD-10-CM | POA: Diagnosis present

## 2013-06-28 DIAGNOSIS — J479 Bronchiectasis, uncomplicated: Secondary | ICD-10-CM

## 2013-06-28 DIAGNOSIS — N183 Chronic kidney disease, stage 3 unspecified: Secondary | ICD-10-CM

## 2013-06-28 DIAGNOSIS — C799 Secondary malignant neoplasm of unspecified site: Secondary | ICD-10-CM

## 2013-06-28 DIAGNOSIS — J4489 Other specified chronic obstructive pulmonary disease: Secondary | ICD-10-CM | POA: Diagnosis present

## 2013-06-28 DIAGNOSIS — C787 Secondary malignant neoplasm of liver and intrahepatic bile duct: Secondary | ICD-10-CM | POA: Diagnosis present

## 2013-06-28 DIAGNOSIS — Z515 Encounter for palliative care: Secondary | ICD-10-CM

## 2013-06-28 DIAGNOSIS — I6529 Occlusion and stenosis of unspecified carotid artery: Secondary | ICD-10-CM | POA: Diagnosis present

## 2013-06-28 DIAGNOSIS — R933 Abnormal findings on diagnostic imaging of other parts of digestive tract: Secondary | ICD-10-CM

## 2013-06-28 DIAGNOSIS — C189 Malignant neoplasm of colon, unspecified: Secondary | ICD-10-CM

## 2013-06-28 HISTORY — DX: Acute myocardial infarction, unspecified: I21.9

## 2013-06-28 LAB — BASIC METABOLIC PANEL
BUN: 18 mg/dL (ref 6–23)
CO2: 27 mEq/L (ref 19–32)
Calcium: 9.6 mg/dL (ref 8.4–10.5)
Chloride: 102 mEq/L (ref 96–112)
Creatinine, Ser: 0.73 mg/dL (ref 0.50–1.10)
GFR, EST AFRICAN AMERICAN: 88 mL/min — AB (ref >90)
GFR, EST NON AFRICAN AMERICAN: 76 mL/min — AB (ref >90)
GLUCOSE: 106 mg/dL — AB (ref 70–99)
POTASSIUM: 4.5 meq/L (ref 3.7–5.3)
SODIUM: 139 meq/L (ref 137–147)

## 2013-06-28 LAB — URINALYSIS, ROUTINE W REFLEX MICROSCOPIC
Bilirubin Urine: NEGATIVE
Glucose, UA: NEGATIVE mg/dL
HGB URINE DIPSTICK: NEGATIVE
Ketones, ur: NEGATIVE mg/dL
Leukocytes, UA: NEGATIVE
NITRITE: NEGATIVE
PROTEIN: NEGATIVE mg/dL
Specific Gravity, Urine: 1.01 (ref 1.005–1.030)
Urobilinogen, UA: 0.2 mg/dL (ref 0.0–1.0)
pH: 6.5 (ref 5.0–8.0)

## 2013-06-28 LAB — CBC WITH DIFFERENTIAL/PLATELET
BASOS ABS: 0 10*3/uL (ref 0.0–0.1)
BASOS PCT: 1 % (ref 0–1)
EOS ABS: 0.2 10*3/uL (ref 0.0–0.7)
Eosinophils Relative: 4 % (ref 0–5)
HCT: 35.7 % — ABNORMAL LOW (ref 36.0–46.0)
Hemoglobin: 11.7 g/dL — ABNORMAL LOW (ref 12.0–15.0)
Lymphocytes Relative: 25 % (ref 12–46)
Lymphs Abs: 1.4 10*3/uL (ref 0.7–4.0)
MCH: 30.5 pg (ref 26.0–34.0)
MCHC: 32.8 g/dL (ref 30.0–36.0)
MCV: 93 fL (ref 78.0–100.0)
Monocytes Absolute: 0.5 10*3/uL (ref 0.1–1.0)
Monocytes Relative: 9 % (ref 3–12)
NEUTROS PCT: 61 % (ref 43–77)
Neutro Abs: 3.5 10*3/uL (ref 1.7–7.7)
PLATELETS: 223 10*3/uL (ref 150–400)
RBC: 3.84 MIL/uL — ABNORMAL LOW (ref 3.87–5.11)
RDW: 14.4 % (ref 11.5–15.5)
WBC: 5.7 10*3/uL (ref 4.0–10.5)

## 2013-06-28 SURGERY — EGD (ESOPHAGOGASTRODUODENOSCOPY)
Anesthesia: Moderate Sedation

## 2013-06-28 MED ORDER — DIPHENHYDRAMINE HCL 50 MG/ML IJ SOLN
12.5000 mg | Freq: Once | INTRAMUSCULAR | Status: AC
  Administered 2013-06-28: 12.5 mg via INTRAVENOUS
  Filled 2013-06-28: qty 1

## 2013-06-28 MED ORDER — LABETALOL HCL 5 MG/ML IV SOLN
10.0000 mg | Freq: Once | INTRAVENOUS | Status: AC
  Administered 2013-06-28: 10 mg via INTRAVENOUS
  Filled 2013-06-28: qty 4

## 2013-06-28 MED ORDER — ACETAMINOPHEN 325 MG PO TABS: 650.0000 mg | ORAL_TABLET | Freq: Four times a day (QID) | ORAL | Status: DC | PRN

## 2013-06-28 MED ORDER — SODIUM CHLORIDE 0.9 % IJ SOLN
3.0000 mL | Freq: Two times a day (BID) | INTRAMUSCULAR | Status: DC
  Administered 2013-06-28 – 2013-07-03 (×3): 3 mL via INTRAVENOUS

## 2013-06-28 MED ORDER — DEXAMETHASONE SODIUM PHOSPHATE 4 MG/ML IJ SOLN
10.0000 mg | Freq: Once | INTRAMUSCULAR | Status: AC
  Administered 2013-06-28: 10 mg via INTRAVENOUS
  Filled 2013-06-28: qty 3

## 2013-06-28 MED ORDER — IOHEXOL 300 MG/ML  SOLN
80.0000 mL | Freq: Once | INTRAMUSCULAR | Status: AC | PRN
  Administered 2013-06-28: 80 mL via INTRAVENOUS

## 2013-06-28 MED ORDER — SODIUM CHLORIDE 0.9 % IJ SOLN
3.0000 mL | Freq: Two times a day (BID) | INTRAMUSCULAR | Status: DC
  Administered 2013-06-28 – 2013-06-29 (×4): 3 mL via INTRAVENOUS
  Administered 2013-06-30: 12:00:00 via INTRAVENOUS
  Administered 2013-06-30 – 2013-07-02 (×5): 3 mL via INTRAVENOUS

## 2013-06-28 MED ORDER — SODIUM CHLORIDE 0.9 % IJ SOLN: 3.0000 mL | INTRAMUSCULAR | Status: DC | PRN

## 2013-06-28 MED ORDER — PANTOPRAZOLE SODIUM 40 MG PO TBEC
40.0000 mg | DELAYED_RELEASE_TABLET | Freq: Every day | ORAL | Status: DC
  Administered 2013-06-28 – 2013-07-03 (×6): 40 mg via ORAL
  Filled 2013-06-28 (×5): qty 1

## 2013-06-28 MED ORDER — IOHEXOL 300 MG/ML  SOLN
75.0000 mL | Freq: Once | INTRAMUSCULAR | Status: AC | PRN
  Administered 2013-06-28: 75 mL via INTRAVENOUS

## 2013-06-28 MED ORDER — ALBUTEROL SULFATE HFA 108 (90 BASE) MCG/ACT IN AERS: 1.0000 | INHALATION_SPRAY | RESPIRATORY_TRACT | Status: DC | PRN

## 2013-06-28 MED ORDER — DEXAMETHASONE SODIUM PHOSPHATE 4 MG/ML IJ SOLN
4.0000 mg | Freq: Four times a day (QID) | INTRAMUSCULAR | Status: DC
  Administered 2013-06-28 – 2013-07-02 (×14): 4 mg via INTRAVENOUS
  Filled 2013-06-28 (×24): qty 1

## 2013-06-28 MED ORDER — ONDANSETRON HCL 4 MG PO TABS: 4.0000 mg | ORAL_TABLET | Freq: Four times a day (QID) | ORAL | Status: DC | PRN

## 2013-06-28 MED ORDER — ONDANSETRON HCL 4 MG/2ML IJ SOLN: 4.0000 mg | Freq: Four times a day (QID) | INTRAMUSCULAR | Status: DC | PRN

## 2013-06-28 MED ORDER — IOHEXOL 300 MG/ML  SOLN
25.0000 mL | INTRAMUSCULAR | Status: AC
  Administered 2013-06-28 (×2): 25 mL via ORAL

## 2013-06-28 MED ORDER — LEVETIRACETAM 500 MG PO TABS
500.0000 mg | ORAL_TABLET | Freq: Two times a day (BID) | ORAL | Status: DC
  Administered 2013-06-28 – 2013-07-03 (×11): 500 mg via ORAL
  Filled 2013-06-28 (×15): qty 1

## 2013-06-28 MED ORDER — ALBUTEROL SULFATE (2.5 MG/3ML) 0.083% IN NEBU: 2.5000 mg | INHALATION_SOLUTION | RESPIRATORY_TRACT | Status: DC | PRN

## 2013-06-28 MED ORDER — ASPIRIN EC 81 MG PO TBEC
81.0000 mg | DELAYED_RELEASE_TABLET | ORAL | Status: DC
  Administered 2013-06-28 – 2013-07-02 (×3): 81 mg via ORAL
  Filled 2013-06-28 (×3): qty 1

## 2013-06-28 MED ORDER — SODIUM CHLORIDE 0.9 % IV SOLN: 250.0000 mL | INTRAVENOUS | Status: DC | PRN

## 2013-06-28 MED ORDER — ACETAMINOPHEN 650 MG RE SUPP: 650.0000 mg | Freq: Four times a day (QID) | RECTAL | Status: DC | PRN

## 2013-06-28 MED ORDER — CYCLOBENZAPRINE HCL 5 MG PO TABS
5.0000 mg | ORAL_TABLET | Freq: Once | ORAL | Status: AC
  Administered 2013-06-29: 5 mg via ORAL
  Filled 2013-06-28: qty 1

## 2013-06-28 MED ORDER — LOSARTAN POTASSIUM 50 MG PO TABS
50.0000 mg | ORAL_TABLET | Freq: Every day | ORAL | Status: DC
  Administered 2013-06-28 – 2013-07-03 (×6): 50 mg via ORAL
  Filled 2013-06-28 (×6): qty 1

## 2013-06-28 NOTE — ED Notes (Signed)
Report given to Merleen Nicely, RN at [facility].

## 2013-06-28 NOTE — ED Notes (Signed)
MD Goodrich at bedside.

## 2013-06-28 NOTE — Progress Notes (Signed)
Called flow manager to obtain attending MD-they will page it out.  Kizzie Bane, RN

## 2013-06-28 NOTE — Progress Notes (Signed)
Patient arrived via stretcher from [facility].  Patient is very pleasant and alert and oriented x 4.  Oriented patient to unit and gave her something to drink and a snack.  Will page [facility] and continue to monitor. Kizzie Bane, RN

## 2013-06-28 NOTE — ED Notes (Signed)
Patient transported to CT 

## 2013-06-28 NOTE — Consult Note (Signed)
Reason for Consult:  Brain metastasis from metastatic cancer Referring Physician: Orpah Greek, MD  Kayla Lane is an 80 y.o. right-handed white female.  HPI: Patient admitted to [facility] in transfer from the [facility] emergency room with newly diagnosed metastatic malignancy.  Patient had been feeling weak, chest x-ray was done a week and a half ago it showed multiple: Nodules, and the patient's pulmonologist, Dr. Velvet Bathe, obtained a CT scan of the chest 2 days ago. A CT of the chest showed 3 nodules in the right long and another nodule in the left lung, as well as a mass in the posterior wall of the stomach, and a left adrenal mass.  This morning the patient was having some shaking. Neither she nor her daughter could clearly say whether this was seizure activity, but she was taken to the [facility] emergency room, she was evaluated by Dr. Betsey Holiday, CT scan of the brain was done without and with contrast, and revealed a ring-enhancing mass lesion in the posterior left frontal lobe with surrounding cerebral edema, and resultant mass effect. The patient was given Decadron 10 mg IV, and neurosurgical consultation was requested. I spoke with Dr. Betsey Holiday, and recommended continuing Decadron 4 mg IV every 6 hours, Protonix daily, and Keppra 500 mg every 12 hours. I recommended admission to the hospitalist service at [facility] for further workup of this newly diagnosed metastatic malignancy.  Patient notes that she's had weight loss since about 2012. She reports her current weight is 97 pounds, in about 6 months ago it was 105 pounds.  She describes walking with a shuffle for the past month, numbness and shaking in the right upper extremity involving the arm, forearm, and hand for the past 3 weeks. Also a tendency to loose the grip with her right hand for the past 3 weeks. She has noticed occasionally some numbness and shaking in the right lower extremity.  Her daughter noted some slurred speech this morning.  Patient describes an occasional dizziness for the past month, but denies any headaches, diplopia, or blurred vision.  The patient does have a history of colon cancer, resected via a left colectomy in 1996. She did not require any postoperative radiation therapy or chemotherapy. She also has a history of hypertension, history of 2 myocardial infarctions and has had 3 stents placed (most recently in 2012, taking aspirin, but no longer taking Plavix), remote history of peptic ulcer disease, but GERD, history of COPD. She denies any history of stroke or diabetes.  The chart reflects a history of an allergy to iodinated diagnostic agents. The patient reports that this reaction 70 years ago when she was in high school. Notably she received IV contrast for both her CT of the chest and her CT of the head done 2 days ago and today respectively. She had no reaction to the IV contrast to either study this week. The patient, her daughter, and I do not believe that she has an allergy to IV contrast at this time.  Past Medical History:  Past Medical History  Diagnosis Date  . Arteriosclerotic cardiovascular disease (ASCVD) 1994     Stent to RCA and LAD at [facility] in 1994; neg. stress nuclear 12/98; 04/2005:40% LAD; 70% CX;  echocardiogram in 2009-normal EF; mild to moderate MR.; 01/2011  Non-ST segment elevation MI->  BMS to the distal LAD ,40% in-stent stenosis in proximal LAD, 90% proximal OM1, 40-50% in-stent RCA stenosis, 50% PDA  .  Cerebrovascular disease     bilateral bruits; 7/08-50-69% right and left internal carotid artery stenosis;  2011: 70-80% right; 50% left   . Hypertension   . Hyperlipidemia   . Spinal stenosis   . Colitis 2011    2011  . Degenerative joint disease   . Gastroesophageal reflux disease 2002    Hiatal hernia; dysphasia due to cervical web-dilated in 2002  . Colon carcinoma 1996    left hemicolectomy  . Weight loss 2009    30  pounds 2009-2011  . Mycobacterium avium-intracellulare infection   . Lung nodule 01/2011    right lower lobe; re-image in 08/2011  . Pneumonia   . Osteoarthritis 02/10/2012  . Back pain October 04, 2012    Pt. fell outside  . MI (myocardial infarction)     2012 with stent placed at [facility]    Past Surgical History:  Past Surgical History  Procedure Laterality Date  . Appendectomy    . Partial colectomy  1996    Left for carcinoma  . Kyphosis surgery      Percutaneous  . Tonsillectomy    . Colonoscopy  01/2009; 11/2009    Dr. Rourk-->surgical anastomosis at 18cm. Residual rectal and colonic mucosa appeared normal; Dr. Marlowe Alt papilla, surgical anastomosis at 18cm  . Esophagogastroduodenoscopy  01/2009; 11/2009    Dr. Carron Curie cervical esophageal web s/p dilation, small hh; Dr. Ronni Rumble hh  . Coronary stent placement  02/02/11  . Bladder suspension    . Cataract extraction      Bilateral  . Fracture surgery  10-04-12    Fx back T-12 and L-1  . Total abdominal hysterectomy  1974  . Eye surgery      Family History:  Family History  Problem Relation Age of Onset  . Diabetes type II Other   . Kidney cancer Brother   . Cancer Brother     Kidney and pancriatic  . Hypertension Mother   . Varicose Veins Mother   . Peripheral vascular disease Mother   . Diabetes Sister   . Heart disease Father     Social History:  reports that she has never smoked. She has never used smokeless tobacco. She reports that she does not drink alcohol or use illicit drugs.  Allergies:  Allergies  Allergen Reactions  . Iodinated Diagnostic Agents     Reaction 30 years ago, pt was given iv contrast w/o pre meds on 06/26/2013 after tech consulted rad. No reaction noted.  Marland Kitchen Penicillins     REACTION: Unknown reaction  . Sulfonamide Derivatives     REACTION: Unknown reaction  . Tetracycline     REACTION: Unknown reaction    Medications: I have reviewed the patient's current  medications.  ROS:  Notable for those difficulties described in her history of present illness and past medical history, but is otherwise unremarkable.  Physical Examination: Patient is thin white female, in no acute distress. Blood pressure 138/43, pulse 77, temperature 97.9 F (36.6 C), temperature source Oral, resp. rate 20, height 5\' 1"  (1.549 m), weight 43.999 kg (97 lb), SpO2 97.00%. Lungs:  Clear to auscultation, she has symmetrical rest or excursion. Heart:  Regular rate and rhythm, no murmur. Abdomen:  Soft, nondistended, bowel sounds are present. Extremity:  No clubbing, cyanosis, or edema.  Neurological Examination: Mental Status Examination:  Patient is awake, alert, [facility] [place], and March 2015. Her speech is fluent. She follows commands. Cranial Nerve Examination:  Pupils equal, round, approximately 2 mm  in diameter, and reactive to light. EOM I. Facial sensation intact. Mild flattening of the right nasolabial fold, consistent with a mild central right facial palsy. Hearing intact bilaterally. Palatal movement symmetrical. Shoulder shrug is symmetric. Tongue midline. Motor Examination:  Right hemiparesis, worst in the right upper extremity, but also present in the right lower extremity. Moderate to marked right upper extremity drift. Sensory Examination:  Intact to pinprick in the upper and lower extremities. Reflex Examination:   Trace to one in the upper extremities, 2 at the quadriceps, minimal gastrocnemius. Toes downgoing.   Results for orders placed during the hospital encounter of 06/28/13 (from the past 48 hour(s))  CBC WITH DIFFERENTIAL     Status: Abnormal   Collection Time    06/28/13  7:39 AM      Result Value Ref Range   WBC 5.7  4.0 - 10.5 K/uL   RBC 3.84 (*) 3.87 - 5.11 MIL/uL   Hemoglobin 11.7 (*) 12.0 - 15.0 g/dL   HCT 35.7 (*) 36.0 - 46.0 %   MCV 93.0  78.0 - 100.0 fL   MCH 30.5  26.0 - 34.0 pg   MCHC 32.8  30.0 - 36.0 g/dL    RDW 14.4  11.5 - 15.5 %   Platelets 223  150 - 400 K/uL   Neutrophils Relative % 61  43 - 77 %   Neutro Abs 3.5  1.7 - 7.7 K/uL   Lymphocytes Relative 25  12 - 46 %   Lymphs Abs 1.4  0.7 - 4.0 K/uL   Monocytes Relative 9  3 - 12 %   Monocytes Absolute 0.5  0.1 - 1.0 K/uL   Eosinophils Relative 4  0 - 5 %   Eosinophils Absolute 0.2  0.0 - 0.7 K/uL   Basophils Relative 1  0 - 1 %   Basophils Absolute 0.0  0.0 - 0.1 K/uL  BASIC METABOLIC PANEL     Status: Abnormal   Collection Time    06/28/13  7:39 AM      Result Value Ref Range   Sodium 139  137 - 147 mEq/L   Potassium 4.5  3.7 - 5.3 mEq/L   Chloride 102  96 - 112 mEq/L   CO2 27  19 - 32 mEq/L   Glucose, Bld 106 (*) 70 - 99 mg/dL   BUN 18  6 - 23 mg/dL   Creatinine, Ser 0.73  0.50 - 1.10 mg/dL   Calcium 9.6  8.4 - 10.5 mg/dL   GFR calc non Af Amer 76 (*) >90 mL/min   GFR calc Af Amer 88 (*) >90 mL/min   Comment: (NOTE)     The eGFR has been calculated using the CKD EPI equation.     This calculation has not been validated in all clinical situations.     eGFR's persistently <90 mL/min signify possible Chronic Kidney     Disease.  URINALYSIS, ROUTINE W REFLEX MICROSCOPIC     Status: None   Collection Time    06/28/13  8:21 AM      Result Value Ref Range   Color, Urine YELLOW  YELLOW   APPearance CLEAR  CLEAR   Specific Gravity, Urine 1.010  1.005 - 1.030   pH 6.5  5.0 - 8.0   Glucose, UA NEGATIVE  NEGATIVE mg/dL   Hgb urine dipstick NEGATIVE  NEGATIVE   Bilirubin Urine NEGATIVE  NEGATIVE   Ketones, ur NEGATIVE  NEGATIVE mg/dL   Protein, ur NEGATIVE  NEGATIVE  mg/dL   Urobilinogen, UA 0.2  0.0 - 1.0 mg/dL   Nitrite NEGATIVE  NEGATIVE   Leukocytes, UA NEGATIVE  NEGATIVE   Comment: MICROSCOPIC NOT DONE ON URINES WITH NEGATIVE PROTEIN, BLOOD, LEUKOCYTES, NITRITE, OR GLUCOSE <1000 mg/dL.    Ct Head W Wo Contrast  06/28/2013   CLINICAL DATA:  Right-sided hemiparesis, recent diagnosis of colon cancer  EXAM: CT HEAD WITHOUT AND  WITH CONTRAST  TECHNIQUE: Contiguous axial images were obtained from the base of the skull through the vertex without and with intravenous contrast  CONTRAST:  54mL OMNIPAQUE IOHEXOL 300 MG/ML  SOLN  COMPARISON:  CT ABD/PELV WO CM dated 01/03/2013; CT CHEST W/CM dated 06/26/2013  FINDINGS: There is an approximately 2.4 x 1.9 cm peripherally enhancing mass within the high left frontoparietal lobe (image 21, series 3) with associated adjacent vasogenic edema, which given history of malignancy is worrisome for metastatic disease. No additional discrete enhancing intraparenchymal or extra-axial mass.  Mild atrophy with sulcal prominence. Scattered periventricular hypodensities suggestive of microvascular ischemic disease. No definite intraparenchymal or extra-axial hemorrhage. Normal size and configuration of the ventricles and basilar cisterns. No midline shift. There is underpneumatization of the right frontal sinus. Remaining paranasal sinuses and mastoid air cells are normally aerated. Intracranial atherosclerosis. Post bilateral cataract surgery. Regional soft tissues appear normal. No displaced calvarial fracture.  IMPRESSION: 1. Peripherally enhancing approximately 2.4 cm intraparenchymal mass within the left frontoparietal lobe with associated vasogenic edema, which in the presence of a known malignancy is worrisome for a metastasis. Further evaluation with brain MRI could be performed as clinically indicated. 2. No definitive evidence of intraparenchymal or extra-axial hemorrhage or infarct.   Electronically Signed   By: Sandi Mariscal M.D.   On: 06/28/2013 09:17     Assessment/Plan: Patient with newly diagnosed metastatic malignancy. CT the brain today shows a ring-enhancing mass lesion in the posterior left frontal lobe, with surrounding cerebral edema, with resultant mass effect. She's been having symptoms for the past month, and has a moderate right hemiparesis, involving the face, right upper extremity,  and right lower extremity. However the greatest weakness is in the right upper extremity. CT of the chest done 2 days ago reveals multiple masses in the lungs, as well as a mass involving the posterior wall of the stomach and another mass involving the left adrenal gland.  I spoke with the patient at her bedside at length this evening, along with her daughter, Kayla Lane, as well as with some family and friends who were present.  I reviewed the patient's imaging including her CT scan of the head and her CT scan of the chest with them. I've explained that the patient will require further workup, but certainly will require additional consultations and extensive treatment.  I have reviewed the case and images with Dr. Kyung Rudd from radiation oncology, as well as with several of my neurosurgical partners including Drs. Ashok Pall and Consuella Lose. As regards the brain mass, I've recommended 3T SRS protocol MRI of the brain without and with contrast, and treatment to include preoperative stereotactic radiosurgery, to be followed a couple days later by craniotomy for resection of the tumor.  I've requested consultation from Dr. Lisbeth Renshaw, who will see the patient. I've also coordinated with Mont Dutton [phone]), the [facility] navigator from the radiation oncology department for the patient to undergo stimulation in the radiation oncology department on Monday, March 23, and stereotactic radiosurgery on Wednesday, March 25. The patient is scheduled for craniotomy for resection  of the brain metastasis on Friday, March 27.  I've also discussed the case with Dr. Marcy Panning from a medical oncology. I've requested consultation, and Dr. Humphrey Rolls will see the patient. I've requested a CT of the abdomen and pelvis with contrast for further assessment of the extent of disease within the abdomen and pelvis.  Over 90 minutes was spent preparing this consultation, including review of medical information and imaging, direct  face-to-face evaluation of the patient by history and examination, consultation with multiple physicians, and conferencing with the patient and her family. The patient and her family's questions were answered for them.  Hosie Spangle, MD 06/28/2013, 7:02 PM

## 2013-06-28 NOTE — H&P (Signed)
History and Physical  Kayla Lane:[id number] DOB: 1925-05-17 DOA: 06/28/2013  Referring physician: Dr. Betsey Holiday in ED PCP: Alonza Bogus, MD   Chief Complaint: right-sided weakness  HPI:  80 year old woman with history of colon cancer 1996 and recent CT scan suspicious for gastric carcinoma present to the emergency department with worsening right arm and right leg tremors and significant weakness when she woke up this morning. Initial evaluation confirmed right-sided weakness and CT of the head revealed intraparenchymal mass with associated vasogenic edema worrisome for metastasis. EDP discussed with Dr. Sherwood Gambler who recommended transfer to [facility] for neurosurgery evaluation.   History obtained from patient as well as daughter at bedside. Over the last 2 weeks patient has had tremors of the right upper and right lower extremities which have been worse in the morning. This morning when she woke tremors were much worse and she was having difficulty controlling her right arm and leg. No frank seizure activity or syncope. No dysarthria, dysphagia or other neuro abnormalities noted. Since being in the emergency department she has had improvement with movement of her right arm and right leg.  She underwent CT scan of the chest 3/18 for weight loss which revealed bilateral pulmonary nodules, left adrenal nodule and heterogeneous mass in the splenic hilum worrisome for metastatic disease. Abnormal appearance of the stomach suspicious for gastric carcinoma.  In the emergency department hypertensive, not bradycardic. Afebrile no hypoxia. Normal respiratory rate. Basic metabolic panel unremarkable. CBC unremarkable. Urinalysis negative. Patient was treated with Decadron, Keppra per recommendations of neurosurgery.  Review of Systems:  Negative for fever, visual changes, sore throat, rash, new muscle aches, chest pain, SOB, dysuria, bleeding, n/v/abdominal pain.  Past Medical History  Diagnosis Date    . Arteriosclerotic cardiovascular disease (ASCVD) 1994     Stent to RCA and LAD at [facility] in 1994; neg. stress nuclear 12/98; 04/2005:40% LAD; 70% CX;  echocardiogram in 2009-normal EF; mild to moderate MR.; 01/2011  Non-ST segment elevation MI->  BMS to the distal LAD ,40% in-stent stenosis in proximal LAD, 90% proximal OM1, 40-50% in-stent RCA stenosis, 50% PDA  . Cerebrovascular disease     bilateral bruits; 7/08-50-69% right and left internal carotid artery stenosis;  2011: 70-80% right; 50% left   . Hypertension   . Hyperlipidemia   . Spinal stenosis   . Colitis 2011    2011  . Degenerative joint disease   . Gastroesophageal reflux disease 2002    Hiatal hernia; dysphasia due to cervical web-dilated in 2002  . Colon carcinoma 1996    left hemicolectomy  . Weight loss 2009    30 pounds 2009-2011  . Mycobacterium avium-intracellulare infection   . Lung nodule 01/2011    right lower lobe; re-image in 08/2011  . Pneumonia   . Osteoarthritis 02/10/2012  . Back pain October 04, 2012    Pt. fell outside  . MI (myocardial infarction)     2012 with stent [facility] at [facility]    Past Surgical History  Procedure Laterality Date  . Appendectomy    . Partial colectomy  1996    Left for carcinoma  . Kyphosis surgery      Percutaneous  . Tonsillectomy    . Colonoscopy  01/2009; 11/2009    Dr. Rourk-->surgical anastomosis at 18cm. Residual rectal and colonic mucosa appeared normal; Dr. [name] Frieze papilla, surgical anastomosis at 18cm  . Esophagogastroduodenoscopy  01/2009; 11/2009    Dr. Daiva Nakayama cervical esophageal web s/p dilation, small hh; Dr. Trevor Iha hh  .  Coronary stent placement  02/02/11  . Bladder suspension    . Cataract extraction      Bilateral  . Fracture surgery  10-04-12    Fx back T-12 and L-1  . Total abdominal hysterectomy  1974  . Eye surgery      Social History:  reports that she has never smoked. She has never used smokeless tobacco. She reports that she  does not drink alcohol or use illicit drugs.  Allergies  Allergen Reactions  . Iodinated Diagnostic Agents     Reaction 30 years ago, pt was given iv contrast w/o pre meds on 06/26/2013 after tech consulted rad. No reaction noted.  Marland Kitchen Penicillins     REACTION: Unknown reaction  . Sulfonamide Derivatives     REACTION: Unknown reaction  . Tetracycline     REACTION: Unknown reaction    Family History  Problem Relation Age of Onset  . Diabetes type II Other   . Kidney cancer Brother   . Cancer Brother     Kidney and pancriatic  . Hypertension Mother   . Varicose Veins Mother   . Peripheral vascular disease Mother   . Diabetes Sister   . Heart disease Father      Prior to Admission medications   Medication Sig Start Date End Date Taking? Authorizing Provider  Cyanocobalamin (VITAMIN B-12 IJ) Inject as directed every 30 (thirty) days.   Yes Historical Provider, MD  HYDROcodone-acetaminophen (NORCO) 5-325 MG per tablet Take 1 tablet by mouth 3 (three) times daily. For pain   Yes Historical Provider, MD  losartan (COZAAR) 100 MG tablet Take 0.5 tablets (50 mg total) by mouth daily. TAKE (1/2) TABLET BY MOUTH DAILY. 01/18/13  Yes Lendon Colonel, NP  naproxen sodium (ALEVE) 220 MG tablet Take 220 mg by mouth 2 (two) times daily with a meal. OTC medication   Yes Historical Provider, MD  PREVACID 24HR 15 MG capsule TAKE ONE CAPSULE DAILY. 02/06/13  Yes Orvil Feil, NP  Probiotic Product (ALIGN PO) Take by mouth daily.   Yes Historical Provider, MD  albuterol (PROVENTIL HFA;VENTOLIN HFA) 108 (90 BASE) MCG/ACT inhaler Inhale 1 puff into the lungs every 12 (twelve) hours as needed. 01/17/13   Lendon Colonel, NP  aspirin EC 81 MG tablet Take 81 mg by mouth every other day.     Historical Provider, MD  benzonatate (TESSALON) 100 MG capsule Take 100 mg by mouth 2 (two) times daily. For cough    Historical Provider, MD  mupirocin ointment (BACTROBAN) 2 %  05/10/12   Historical Provider, MD   nitroGLYCERIN (NITROSTAT) 0.4 MG SL tablet Place 1 tablet (0.4 mg total) under the tongue every 5 (five) minutes as needed. 02/21/11   Lendon Colonel, NP   Physical Exam: Filed Vitals:   06/28/13 0919 06/28/13 1000 06/28/13 1045 06/28/13 1100  BP: 163/73 183/83  186/60  Pulse: 88 81  82  Temp:   98.4 F (36.9 C)   Resp: 18 22  19   Height:      Weight:      SpO2: 99% 99%  100%    General: Examined in the emergency department. Appears calm and comfortable. Eyes: PERRL, normal lids, irises  ENT: grossly normal hearing, lips & tongue. Fair dentition with many missing teeth. Neck: no LAD, masses or thyromegaly Cardiovascular: RRR, no m/r/g. No LE edema. Respiratory: CTA bilaterally, no w/r/r. Normal respiratory effort. Abdomen: soft, ntnd Skin: no rash or induration seen  Musculoskeletal: Grossly normal  tone and strength left upper and left lower extremity. Grossly normal right upper and right lower extremity, diminished strength compared to the left side but able to lift arm and leg against gravity. Psychiatric: grossly normal mood and affect, speech fluent and appropriate Neurologic: Cranial nerves 2-12 appear intact. She has difficulty with finger-nose especially with the right hand. There is no resting tremor.  Wt Readings from Last 3 Encounters:  06/28/13 43.999 kg (97 lb)  06/28/13 43.999 kg (97 lb)  06/12/13 44.906 kg (99 lb)    Labs on Admission:  Basic Metabolic Panel:  Recent Labs Lab 06/28/13 0739  NA 139  K 4.5  CL 102  CO2 27  GLUCOSE 106*  BUN 18  CREATININE 0.73  CALCIUM 9.6    CBC:  Recent Labs Lab 06/28/13 0739  WBC 5.7  NEUTROABS 3.5  HGB 11.7*  HCT 35.7*  MCV 93.0  PLT 223    Radiological Exams on Admission: Ct Head W Wo Contrast  06/28/2013   CLINICAL DATA:  Right-sided hemiparesis, recent diagnosis of colon cancer  EXAM: CT HEAD WITHOUT AND WITH CONTRAST  TECHNIQUE: Contiguous axial images were obtained from the base of the skull  through the vertex without and with intravenous contrast  CONTRAST:  43mL OMNIPAQUE IOHEXOL 300 MG/ML  SOLN  COMPARISON:  CT ABD/PELV WO CM dated 01/03/2013; CT CHEST W/CM dated 06/26/2013  FINDINGS: There is an approximately 2.4 x 1.9 cm peripherally enhancing mass within the high left frontoparietal lobe (image 21, series 3) with associated adjacent vasogenic edema, which given history of malignancy is worrisome for metastatic disease. No additional discrete enhancing intraparenchymal or extra-axial mass.  Mild atrophy with sulcal prominence. Scattered periventricular hypodensities suggestive of microvascular ischemic disease. No definite intraparenchymal or extra-axial hemorrhage. Normal size and configuration of the ventricles and basilar cisterns. No midline shift. There is underpneumatization of the right frontal sinus. Remaining paranasal sinuses and mastoid air cells are normally aerated. Intracranial atherosclerosis. Post bilateral cataract surgery. Regional soft tissues appear normal. No displaced calvarial fracture.  IMPRESSION: 1. Peripherally enhancing approximately 2.4 cm intraparenchymal mass within the left frontoparietal lobe with associated vasogenic edema, which in the presence of a known malignancy is worrisome for a metastasis. Further evaluation with brain MRI could be performed as clinically indicated. 2. No definitive evidence of intraparenchymal or extra-axial hemorrhage or infarct.   Electronically Signed   By: Sandi Mariscal M.D.   On: 06/28/2013 09:17   Ct Chest W Contrast  06/26/2013   CLINICAL DATA:  Weight loss, pulmonary nodule.  EXAM: CT CHEST WITH CONTRAST  TECHNIQUE: Multidetector CT imaging of the chest was performed during intravenous contrast administration.  CONTRAST:  55mL OMNIPAQUE IOHEXOL 300 MG/ML  SOLN  COMPARISON:  DG CHEST 2 VIEW dated 06/18/2013; CT CHEST W/CM dated 01/25/2011; CT CHEST W/CM dated 11/05/2009; CT CHEST W/O CM dated 10/31/2008; CT ABD/PELV WO CM dated  01/03/2013  FINDINGS: Mediastinal lymph nodes measure up to 8 mm in the low right paratracheal station, unchanged. Right hilar lymph node measures 9 mm. No left hilar or axillary adenopathy. Surgical clips in the left axilla. Coronary artery calcification. Heart size normal. No pericardial effusion.  Lobulated pulmonary nodules measure up to 1.6 x 2.5 cm in the superior segment left lower lobe (image 30) and are new from 01/25/2011. There is an underlying pattern of mildly progressive bronchiectasis, peribronchovascular nodularity and ground-glass, predominating in the right middle lobe and lingula. No pleural fluid. Airway is unremarkable.  Incidental imaging of the  upper abdomen shows the visualized portion of the liver, gallbladder and right adrenal gland to be grossly unremarkable. There is a new 1.0 x 1.6 cm nodule in the lateral limb left adrenal gland. There may be stones in the right kidney with renal cortical scarring bilaterally. Spleen is unremarkable. However, a heterogeneous mass in the splenic hilum measures 2.6 x 2.7 cm, new. The proximal gastric wall appears thickened and there may be a low-attenuation mass along the posterior wall, measuring approximately 1.6 x 2.5 cm (image 51). No upper abdominal adenopathy. No worrisome lytic or sclerotic lesions. Vertebroplasties.  IMPRESSION: 1. New lobulated bilateral pulmonary nodules, new left adrenal nodule and new heterogeneous mass in the splenic hilum, most indicative of metastatic disease. Given the masslike area of low attenuation in the posterior gastric wall with apparent gastric wall thickening, findings are suspicious for gastric carcinoma. 2. Coronary artery calcification. 3. Underlying pattern of mildly progressive bronchiectasis, peribronchovascular nodularity and ground-glass, indicative of mycobacterium avium complex (MAC). 4. Question right renal stones.   Electronically Signed   By: Lorin Picket M.D.   On: 06/26/2013 17:31    Active  Problems:   Brain tumor   Assessment/Plan 1. Intraparenchymal mass with associated vasogenic edema. Worrisome for metastatic disease. Likely cause of right-sided symptoms. Currently nontoxic in appearance. 2. Suspected gastric carcinoma with metastatic disease to the lungs and spleen. No diagnosis based on CT 3/18. 3. History of MAC with bronchiectasis. Appears stable. 4. History of coronary artery disease, 1994/2012 with stent placement. Followed by cardiology. Appears to be stable at this point.   As per discussion with EDP, neurosurgery has recommended transfer to [facility] for further evaluation and possible surgery. In the meantime continue Decadron, PPI, Keppra. Neurologically stable at this point, admit to telemetry bed.  Oncology consultation at [facility].  Discussed with daughter/healthcare power of attorney at bedside. Patient desires to be full code. Patient and family open to surgical procedures and other modalities that may be recommended.  Code Status: full code  DVT prophylaxis:SCDs Family Communication:  Disposition Plan/Anticipated LOS: admit [facility], accepting MD Dr. Garwin Brothers with [facility]. Discussed with flow manager at [facility].  Time spent: 70 minutes  Rewis Hodgkins, MD  [facility] Pager [phone] 06/28/2013, 12:37 PM

## 2013-06-28 NOTE — ED Provider Notes (Addendum)
CSN: [id number]     Arrival date & time 06/28/13  [id number] History   First MD Initiated Contact with Patient 06/28/13 [phone]     Chief Complaint  Patient presents with  . Numbness     (Consider location/radiation/quality/duration/timing/severity/associated sxs/prior Treatment) HPI Comments: Patient brought to the ER by family for evaluation of weakness and shaking of the right side of her body. Patient reportedly stayed up late last night watching basketball. She went to bed around midnight and felt fine. When she woke up approximately 30 minutes ago, she had difficulty walking. She was very unsteady on her feet. She noticed numbness and weakness of the right arm and leg. She reports that she is having trouble controlling them and they are shaking. She does not have any headache. There has not been any speech disturbance.   Past Medical History  Diagnosis Date  . Arteriosclerotic cardiovascular disease (ASCVD) 1994     Stent to RCA and LAD at [facility] in 1994; neg. stress nuclear 12/98; 04/2005:40% LAD; 70% CX;  echocardiogram in 2009-normal EF; mild to moderate MR.; 01/2011  Non-ST segment elevation MI->  BMS to the distal LAD ,40% in-stent stenosis in proximal LAD, 90% proximal OM1, 40-50% in-stent RCA stenosis, 50% PDA  . Cerebrovascular disease     bilateral bruits; 7/08-50-69% right and left internal carotid artery stenosis;  2011: 70-80% right; 50% left   . Hypertension   . Hyperlipidemia   . Spinal stenosis   . Colitis 2011    2011  . Degenerative joint disease   . Gastroesophageal reflux disease 2002    Hiatal hernia; dysphasia due to cervical web-dilated in 2002  . Colon carcinoma 1996    left hemicolectomy  . Weight loss 2009    30 pounds 2009-2011  . Mycobacterium avium-intracellulare infection   . Lung nodule 01/2011    right lower lobe; re-image in 08/2011  . Pneumonia   . Myocardial infarction     Heart Attack  . Osteoarthritis 02/10/2012  . Back pain October 04, 2012    Pt. fell  outside   Past Surgical History  Procedure Laterality Date  . Appendectomy    . Partial colectomy  1996    Left for carcinoma  . Kyphosis surgery      Percutaneous  . Tonsillectomy    . Colonoscopy  01/2009; 11/2009    Dr. Rourk-->surgical anastomosis at 18cm. Residual rectal and colonic mucosa appeared normal; Dr. [name] Frieze papilla, surgical anastomosis at 18cm  . Esophagogastroduodenoscopy  01/2009; 11/2009    Dr. Daiva Nakayama cervical esophageal web s/p dilation, small hh; Dr. Trevor Iha hh  . Coronary stent placement  02/02/11  . Bladder suspension    . Cataract extraction      Bilateral  . Fracture surgery  10-04-12    Fx back T-12 and L-1  . Total abdominal hysterectomy  1974  . Eye surgery     Family History  Problem Relation Age of Onset  . Diabetes type II Other   . Kidney cancer Brother   . Cancer Brother     Kidney and pancriatic  . Hypertension Mother   . Varicose Veins Mother   . Peripheral vascular disease Mother   . Diabetes Sister   . Heart disease Father    History  Substance Use Topics  . Smoking status: Never Smoker   . Smokeless tobacco: Never Used  . Alcohol Use: No   OB History   Grav Para Term Preterm Abortions TAB SAB Ect Mult Living  2 2 2       2      Review of Systems  Respiratory: Negative for shortness of breath.   Cardiovascular: Negative for chest pain.  Neurological: Positive for weakness (Right side extremities) and numbness (right side extremities).  All other systems reviewed and are negative.      Allergies  Iodinated diagnostic agents; Penicillins; Sulfonamide derivatives; and Tetracycline  Home Medications   Current Outpatient Rx  Name  Route  Sig  Dispense  Refill  . Cyanocobalamin (VITAMIN B-12 IJ)   Injection   Inject as directed every 30 (thirty) days.         Marland Kitchen HYDROcodone-acetaminophen (NORCO) 5-325 MG per tablet   Oral   Take 1 tablet by mouth 3 (three) times daily. For pain         .  losartan (COZAAR) 100 MG tablet   Oral   Take 0.5 tablets (50 mg total) by mouth daily. TAKE (1/2) TABLET BY MOUTH DAILY.   15 tablet   5   . naproxen sodium (ALEVE) 220 MG tablet   Oral   Take 220 mg by mouth 2 (two) times daily with a meal. OTC medication         . PREVACID 24HR 15 MG capsule      TAKE ONE CAPSULE DAILY.   28 capsule   5   . Probiotic Product (ALIGN PO)   Oral   Take by mouth daily.         Marland Kitchen albuterol (PROVENTIL HFA;VENTOLIN HFA) 108 (90 BASE) MCG/ACT inhaler   Inhalation   Inhale 1 puff into the lungs every 12 (twelve) hours as needed.         Marland Kitchen aspirin EC 81 MG tablet   Oral   Take 81 mg by mouth every other day.          . benzonatate (TESSALON) 100 MG capsule   Oral   Take 100 mg by mouth 2 (two) times daily. For cough         . mupirocin ointment (BACTROBAN) 2 %               . nitroGLYCERIN (NITROSTAT) 0.4 MG SL tablet   Sublingual   Place 1 tablet (0.4 mg total) under the tongue every 5 (five) minutes as needed.   25 tablet   3    BP 183/83  Pulse 81  Temp(Src) 98.3 F (36.8 C)  Resp 22  Ht 5\' 1"  (1.549 m)  Wt 97 lb (43.999 kg)  BMI 18.34 kg/m2  SpO2 99% Physical Exam  Constitutional: She is oriented to person, place, and time. She appears well-developed and well-nourished. No distress.  HENT:  Head: Normocephalic and atraumatic.  Right Ear: Hearing normal.  Left Ear: Hearing normal.  Nose: Nose normal.  Mouth/Throat: Oropharynx is clear and moist and mucous membranes are normal.  Eyes: Conjunctivae and EOM are normal. Pupils are equal, round, and reactive to light.  Neck: Normal range of motion. Neck supple.  Cardiovascular: Regular rhythm, S1 normal and S2 normal.  Exam reveals no gallop and no friction rub.   No murmur heard. Pulmonary/Chest: Effort normal and breath sounds normal. No respiratory distress. She exhibits no tenderness.  Abdominal: Soft. Normal appearance and bowel sounds are normal. There is no  hepatosplenomegaly. There is no tenderness. There is no rebound, no guarding, no tenderness at McBurney's point and negative Murphy's sign. No hernia.  Musculoskeletal: Normal range of motion.  Neurological: She is alert  and oriented to person, place, and time. She has normal strength. A sensory deficit (right arm and leg) is present. No cranial nerve deficit. She exhibits abnormal muscle tone (Right arm and leg). Coordination normal. GCS eye subscore is 4. GCS verbal subscore is 5. GCS motor subscore is 6.  Left upper extremity strength 4+ out of 5 Right upper extremity strength 2/5 (cannot raise against gravity with severe ataxia)  Left lower extremity strength 4/5 Right lower extremity strength 3/5 (difficulty raising against gravity with severe ataxia)  Skin: Skin is warm, dry and intact. No rash noted. No cyanosis.  Psychiatric: She has a normal mood and affect. Her speech is normal and behavior is normal. Thought content normal.    ED Course  Procedures (including critical care time)  CRITICAL CARE Performed by: Orpah Greek.   Total critical care time: 16min  Critical care time was exclusive of separately billable procedures and treating other patients.  Critical care was necessary to treat or prevent imminent or life-threatening deterioration.  Critical care was time spent personally by me on the following activities: development of treatment plan with patient and/or surrogate as well as nursing, discussions with consultants, evaluation of patient's response to treatment, examination of patient, obtaining history from patient or surrogate, ordering and performing treatments and interventions, ordering and review of laboratory studies, ordering and review of radiographic studies, pulse oximetry and re-evaluation of patient's condition.   Labs Review Labs Reviewed  CBC WITH DIFFERENTIAL - Abnormal; Notable for the following:    RBC 3.84 (*)    Hemoglobin 11.7 (*)     HCT 35.7 (*)    All other components within normal limits  BASIC METABOLIC PANEL - Abnormal; Notable for the following:    Glucose, Bld 106 (*)    GFR calc non Af Amer 76 (*)    GFR calc Af Amer 88 (*)    All other components within normal limits  URINALYSIS, ROUTINE W REFLEX MICROSCOPIC   Imaging Review Ct Head W Wo Contrast  06/28/2013   CLINICAL DATA:  Right-sided hemiparesis, recent diagnosis of colon cancer  EXAM: CT HEAD WITHOUT AND WITH CONTRAST  TECHNIQUE: Contiguous axial images were obtained from the base of the skull through the vertex without and with intravenous contrast  CONTRAST:  37mL OMNIPAQUE IOHEXOL 300 MG/ML  SOLN  COMPARISON:  CT ABD/PELV WO CM dated 01/03/2013; CT CHEST W/CM dated 06/26/2013  FINDINGS: There is an approximately 2.4 x 1.9 cm peripherally enhancing mass within the high left frontoparietal lobe (image 21, series 3) with associated adjacent vasogenic edema, which given history of malignancy is worrisome for metastatic disease. No additional discrete enhancing intraparenchymal or extra-axial mass.  Mild atrophy with sulcal prominence. Scattered periventricular hypodensities suggestive of microvascular ischemic disease. No definite intraparenchymal or extra-axial hemorrhage. Normal size and configuration of the ventricles and basilar cisterns. No midline shift. There is underpneumatization of the right frontal sinus. Remaining paranasal sinuses and mastoid air cells are normally aerated. Intracranial atherosclerosis. Post bilateral cataract surgery. Regional soft tissues appear normal. No displaced calvarial fracture.  IMPRESSION: 1. Peripherally enhancing approximately 2.4 cm intraparenchymal mass within the left frontoparietal lobe with associated vasogenic edema, which in the presence of a known malignancy is worrisome for a metastasis. Further evaluation with brain MRI could be performed as clinically indicated. 2. No definitive evidence of intraparenchymal or  extra-axial hemorrhage or infarct.   Electronically Signed   By: Sandi Mariscal M.D.   On: 06/28/2013 09:17   Ct  Chest W Contrast  06/26/2013   CLINICAL DATA:  Weight loss, pulmonary nodule.  EXAM: CT CHEST WITH CONTRAST  TECHNIQUE: Multidetector CT imaging of the chest was performed during intravenous contrast administration.  CONTRAST:  43mL OMNIPAQUE IOHEXOL 300 MG/ML  SOLN  COMPARISON:  DG CHEST 2 VIEW dated 06/18/2013; CT CHEST W/CM dated 01/25/2011; CT CHEST W/CM dated 11/05/2009; CT CHEST W/O CM dated 10/31/2008; CT ABD/PELV WO CM dated 01/03/2013  FINDINGS: Mediastinal lymph nodes measure up to 8 mm in the low right paratracheal station, unchanged. Right hilar lymph node measures 9 mm. No left hilar or axillary adenopathy. Surgical clips in the left axilla. Coronary artery calcification. Heart size normal. No pericardial effusion.  Lobulated pulmonary nodules measure up to 1.6 x 2.5 cm in the superior segment left lower lobe (image 30) and are new from 01/25/2011. There is an underlying pattern of mildly progressive bronchiectasis, peribronchovascular nodularity and ground-glass, predominating in the right middle lobe and lingula. No pleural fluid. Airway is unremarkable.  Incidental imaging of the upper abdomen shows the visualized portion of the liver, gallbladder and right adrenal gland to be grossly unremarkable. There is a new 1.0 x 1.6 cm nodule in the lateral limb left adrenal gland. There may be stones in the right kidney with renal cortical scarring bilaterally. Spleen is unremarkable. However, a heterogeneous mass in the splenic hilum measures 2.6 x 2.7 cm, new. The proximal gastric wall appears thickened and there may be a low-attenuation mass along the posterior wall, measuring approximately 1.6 x 2.5 cm (image 51). No upper abdominal adenopathy. No worrisome lytic or sclerotic lesions. Vertebroplasties.  IMPRESSION: 1. New lobulated bilateral pulmonary nodules, new left adrenal nodule and new  heterogeneous mass in the splenic hilum, most indicative of metastatic disease. Given the masslike area of low attenuation in the posterior gastric wall with apparent gastric wall thickening, findings are suspicious for gastric carcinoma. 2. Coronary artery calcification. 3. Underlying pattern of mildly progressive bronchiectasis, peribronchovascular nodularity and ground-glass, indicative of mycobacterium avium complex (MAC). 4. Question right renal stones.   Electronically Signed   By: Lorin Picket M.D.   On: 06/26/2013 17:31     EKG Interpretation None      Date: 06/28/2013  Rate: 90   Rhythm: normal sinus rhythm  QRS Axis: normal  Intervals: PR prolonged  ST/T Wave abnormalities: normal  Conduction Disutrbances:none  Narrative Interpretation:   Old EKG Reviewed: unchanged   MDM   Final diagnoses:  Brain tumor  Right hemiparesis  Metastatic cancer    Patient presents to the ER for evaluation of numbness and weakness on the right side of her body upon awakening this morning. Examination did reveal significantly decreased tone, strength and sensation of the right arm and leg. There is associated ataxia. Her last known normal neurologic status, however, was last night before bedtime. She was never a candidate for thrombolytics.  Reviewing her recent records reveals that she had CT scan of chest performed earlier this week which showed suggestion of metastatic cancer. Based on this, differential diagnosis was acute CVA versus tumor. The patient had CT scan with and without contrast. She had listed a reaction to IV dye 30 years ago, but had IV contrast 2 days ago for her CAT scan without premedication and did not have any reaction. She was therefore given a small dose of Benadryl prior to CT scan and contrast was administered without any reaction. CT scan does show evidence of 2.4 cm intraparenchymal mass in the left  frontoparietal lobe with associated vasogenic edema. It is felt that  this explains the patient's current neurologic symptoms.  Based on the current presentation, it was felt that the patient would require intervention for these symptoms. I did consult with neurosurgery, Doctor Sherwood Gambler. He felt that the patient's treatment plan should include stereotactic surgery followed by craniotomy. I did discuss this extensively with the patient and her family. They do agree that he would like intervention is recommended. Patient will therefore be transferred to [facility] for further management.  The patient has been reevaluated multiple times here during the course of her evaluation. She has not had any progression of her symptoms. She remains alert, oriented.  The patient was given Decadron here in the ER. She was also started empirically on Keppra, at the recommendation of Doctor Sherwood Gambler.  Patient was hypertensive here in the ER. Initially this was not treated because of the concern over possible CVA. Once it was known that the neurologic symptoms were secondary to vasogenic edema and tumor, patient was administered labetalol IV.  Orpah Greek, MD 06/28/13 1054  Orpah Greek, MD 06/28/13 [place], MD 06/28/13 1218

## 2013-06-28 NOTE — ED Notes (Addendum)
Pt states she woke up with right arm numbness and generalized weakness this am. lsn midnight last night. Denies ha/cp. nad noted.

## 2013-06-29 ENCOUNTER — Inpatient Hospital Stay (HOSPITAL_COMMUNITY): Payer: Medicare Other

## 2013-06-29 ENCOUNTER — Other Ambulatory Visit: Payer: Self-pay

## 2013-06-29 DIAGNOSIS — J449 Chronic obstructive pulmonary disease, unspecified: Secondary | ICD-10-CM

## 2013-06-29 DIAGNOSIS — R911 Solitary pulmonary nodule: Secondary | ICD-10-CM

## 2013-06-29 DIAGNOSIS — J479 Bronchiectasis, uncomplicated: Secondary | ICD-10-CM

## 2013-06-29 DIAGNOSIS — R933 Abnormal findings on diagnostic imaging of other parts of digestive tract: Secondary | ICD-10-CM

## 2013-06-29 DIAGNOSIS — C801 Malignant (primary) neoplasm, unspecified: Secondary | ICD-10-CM

## 2013-06-29 LAB — CBC
HEMATOCRIT: 36 % (ref 36.0–46.0)
HEMOGLOBIN: 11.7 g/dL — AB (ref 12.0–15.0)
MCH: 29.8 pg (ref 26.0–34.0)
MCHC: 32.5 g/dL (ref 30.0–36.0)
MCV: 91.8 fL (ref 78.0–100.0)
Platelets: 260 10*3/uL (ref 150–400)
RBC: 3.92 MIL/uL (ref 3.87–5.11)
RDW: 14.4 % (ref 11.5–15.5)
WBC: 7.4 10*3/uL (ref 4.0–10.5)

## 2013-06-29 LAB — BASIC METABOLIC PANEL
BUN: 17 mg/dL (ref 6–23)
CO2: 21 meq/L (ref 19–32)
Calcium: 10.3 mg/dL (ref 8.4–10.5)
Chloride: 102 mEq/L (ref 96–112)
Creatinine, Ser: 0.68 mg/dL (ref 0.50–1.10)
GFR calc Af Amer: 90 mL/min — ABNORMAL LOW (ref >90)
GFR calc non Af Amer: 78 mL/min — ABNORMAL LOW (ref >90)
Glucose, Bld: 135 mg/dL — ABNORMAL HIGH (ref 70–99)
Potassium: 4.2 mEq/L (ref 3.7–5.3)
SODIUM: 140 meq/L (ref 137–147)

## 2013-06-29 MED ORDER — HYDROCODONE-ACETAMINOPHEN 5-325 MG PO TABS
1.0000 | ORAL_TABLET | ORAL | Status: DC | PRN
  Administered 2013-06-29 – 2013-07-02 (×6): 1 via ORAL
  Filled 2013-06-29 (×6): qty 1

## 2013-06-29 MED ORDER — GADOBENATE DIMEGLUMINE 529 MG/ML IV SOLN
10.0000 mL | Freq: Once | INTRAVENOUS | Status: AC | PRN
  Administered 2013-06-29: 10 mL via INTRAVENOUS

## 2013-06-29 MED ORDER — BENZONATATE 100 MG PO CAPS
100.0000 mg | ORAL_CAPSULE | Freq: Two times a day (BID) | ORAL | Status: DC
  Administered 2013-06-29 – 2013-07-03 (×9): 100 mg via ORAL
  Filled 2013-06-29 (×10): qty 1

## 2013-06-29 MED ORDER — ENSURE COMPLETE PO LIQD
237.0000 mL | Freq: Two times a day (BID) | ORAL | Status: DC
  Administered 2013-06-29 – 2013-07-03 (×6): 237 mL via ORAL

## 2013-06-29 NOTE — Consult Note (Signed)
Referring Provider: No ref. provider found Primary Care Physician:  Alonza Bogus, MD Primary Gastroenterologist:  Dr. Garfield Cornea, Linna Hoff  Reason for Consultation:  Abnormal CT scan with gastric mass  HPI: Kayla Lane is a 80 y.o. female with history of colon cancer 1996 and chest CT scan on 3/18 suspicious for metastatic disease to the left adrenal, lungs and splenic hilum, and showing a gastric mass. She presented to the emergency department with worsening right arm and right leg tremors and significant weakness when she woke up yesterday morning. Initial evaluation confirmed right-sided weakness and CT of the head revealed intraparenchymal mass with associated vasogenic edema worrisome for metastasis. EDP at [facility] discussed with Dr. Sherwood Gambler who recommended transfer to [facility] for neurosurgery evaluation.   Apparently over the last 2 weeks patient has had tremors of the right upper and right lower extremities which have been worse in the morning. This morning when she woke tremors were much worse and she was having difficulty controlling her right arm and leg. No frank seizure activity or syncope. No dysarthria, dysphagia or other neuro abnormalities noted. Since being in the emergency department she has had improvement with movement of her right arm and right leg.   Pt had EGD and colonoscopy by Dr. Gala Romney in 11/2009 that were unremarkable.   In the emergency department patient was treated with Decadron, Keppra per recommendations of neurosurgery.  CT scan of the abdomen/pelvis performed today with contrast and showed the following:   IMPRESSION:  1. Approximate 3 cm mass involving the superior wall of the gastric fundus, worrisome for gastric carcinoma or GIST. Thickening of the rugal folds of the fundus and proximal body of the stomach is consistent with associated inflammatory changes.  2. Necrotic nodal mass at the splenic hilum, situated between the  spleen and the gastric fundus,  approximating 3 cm. No significant  lymphadenopathy elsewhere in the abdomen or pelvis.  3. 1.5 cm nodule arising from the left adrenal gland, new since the  September, 2014 CT, therefore likely metastatic.  4. New tree-in-bud airspace opacities in the right lower lobe since  the CT chest 2 days ago. Stable peribronchovascular nodularity and ground-glass opacity in the lingula and and right middle lobe.  Stable bronchiectasis in the lingula and right middle lobe. Again,  findings most consistent with mycobacterium avium complex.  5. Non obstructing approximate 4 mm calculus in an upper pole calix of the right kidney.  Neurosurgery has seen the patient and are planning craniotomy on 3/27 for resection of the brain mass.  Patient's son and daughter-in-law have expressed concern about surgery with her age, cardiac history, and overall physical state.      Past Medical History  Diagnosis Date  . Arteriosclerotic cardiovascular disease (ASCVD) 1994     Stent to RCA and LAD at [facility] in 1994; neg. stress nuclear 12/98; 04/2005:40% LAD; 70% CX;  echocardiogram in 2009-normal EF; mild to moderate MR.; 01/2011  Non-ST segment elevation MI->  BMS to the distal LAD ,40% in-stent stenosis in proximal LAD, 90% proximal OM1, 40-50% in-stent RCA stenosis, 50% PDA  . Cerebrovascular disease     bilateral bruits; 7/08-50-69% right and left internal carotid artery stenosis;  2011: 70-80% right; 50% left   . Hypertension   . Hyperlipidemia   . Spinal stenosis   . Colitis 2011    2011  . Degenerative joint disease   . Gastroesophageal reflux disease 2002    Hiatal hernia; dysphasia due to cervical web-dilated in 2002  .  Colon carcinoma 1996    left hemicolectomy  . Weight loss 2009    30 pounds 2009-2011  . Mycobacterium avium-intracellulare infection   . Lung nodule 01/2011    right lower lobe; re-image in 08/2011  . Pneumonia   . Osteoarthritis 02/10/2012  . Back pain October 04, 2012    Pt. fell outside   . MI (myocardial infarction)     2012 with stent placed at [facility]    Past Surgical History  Procedure Laterality Date  . Appendectomy    . Partial colectomy  1996    Left for carcinoma  . Kyphosis surgery      Percutaneous  . Tonsillectomy    . Colonoscopy  01/2009; 11/2009    Dr. Rourk-->surgical anastomosis at 18cm. Residual rectal and colonic mucosa appeared normal; Dr. [name] Frieze papilla, surgical anastomosis at 18cm  . Esophagogastroduodenoscopy  01/2009; 11/2009    Dr. Daiva Nakayama cervical esophageal web s/p dilation, small hh; Dr. Trevor Iha hh  . Coronary stent placement  02/02/11  . Bladder suspension    . Cataract extraction      Bilateral  . Fracture surgery  10-04-12    Fx back T-12 and L-1  . Total abdominal hysterectomy  1974  . Eye surgery      Prior to Admission medications   Medication Sig Start Date End Date Taking? Authorizing Provider  Cyanocobalamin (VITAMIN B-12 IJ) Inject as directed every 30 (thirty) days.   Yes Historical Provider, MD  HYDROcodone-acetaminophen (NORCO) 5-325 MG per tablet Take 1 tablet by mouth 3 (three) times daily. For pain   Yes Historical Provider, MD  losartan (COZAAR) 100 MG tablet Take 0.5 tablets (50 mg total) by mouth daily. TAKE (1/2) TABLET BY MOUTH DAILY. 01/18/13  Yes Lendon Colonel, NP  naproxen sodium (ALEVE) 220 MG tablet Take 220 mg by mouth 2 (two) times daily with a meal. OTC medication   Yes Historical Provider, MD  PREVACID 24HR 15 MG capsule TAKE ONE CAPSULE DAILY. 02/06/13  Yes Orvil Feil, NP  Probiotic Product (ALIGN PO) Take by mouth daily.   Yes Historical Provider, MD  albuterol (PROVENTIL HFA;VENTOLIN HFA) 108 (90 BASE) MCG/ACT inhaler Inhale 1 puff into the lungs every 12 (twelve) hours as needed. 01/17/13   Lendon Colonel, NP  aspirin EC 81 MG tablet Take 81 mg by mouth every other day.     Historical Provider, MD  benzonatate (TESSALON) 100 MG capsule Take 100 mg by mouth 2 (two) times daily.  For cough    Historical Provider, MD  mupirocin ointment (BACTROBAN) 2 %  05/10/12   Historical Provider, MD  nitroGLYCERIN (NITROSTAT) 0.4 MG SL tablet Place 1 tablet (0.4 mg total) under the tongue every 5 (five) minutes as needed. 02/21/11   Lendon Colonel, NP    Current Facility-Administered Medications  Medication Dose Route Frequency Provider Last Rate Last Dose  . 0.9 %  sodium chloride infusion  250 mL Intravenous PRN Samuella Cota, MD      . acetaminophen (TYLENOL) tablet 650 mg  650 mg Oral Q6H PRN Samuella Cota, MD       Or  . acetaminophen (TYLENOL) suppository 650 mg  650 mg Rectal Q6H PRN Samuella Cota, MD      . albuterol (PROVENTIL) (2.5 MG/3ML) 0.083% nebulizer solution 2.5 mg  2.5 mg Nebulization Q4H PRN Theodis Blaze, MD      . aspirin EC tablet 81 mg  81 mg Oral Dyann Ruddle  Wiliam Ke, MD   81 mg at 06/28/13 1627  . dexamethasone (DECADRON) injection 4 mg  4 mg Intravenous 4 times per day Orpah Greek, MD   4 mg at 06/29/13 0535  . levETIRAcetam (KEPPRA) tablet 500 mg  500 mg Oral BID Orpah Greek, MD   500 mg at 06/29/13 0007  . losartan (COZAAR) tablet 50 mg  50 mg Oral Daily Samuella Cota, MD   50 mg at 06/28/13 1627  . ondansetron (ZOFRAN) tablet 4 mg  4 mg Oral Q6H PRN Samuella Cota, MD       Or  . ondansetron [facility]) injection 4 mg  4 mg Intravenous Q6H PRN Samuella Cota, MD      . pantoprazole (PROTONIX) EC tablet 40 mg  40 mg Oral Daily Orpah Greek, MD   40 mg at 06/28/13 1102  . sodium chloride 0.9 % injection 3 mL  3 mL Intravenous Q12H Samuella Cota, MD   3 mL at 06/28/13 1628  . sodium chloride 0.9 % injection 3 mL  3 mL Intravenous Q12H Samuella Cota, MD   3 mL at 06/28/13 2155  . sodium chloride 0.9 % injection 3 mL  3 mL Intravenous PRN Samuella Cota, MD        Allergies as of 06/28/2013 - Review Complete 06/28/2013  Allergen Reaction Noted  . Iodinated diagnostic agents  05/25/2011   . Penicillins    . Sulfonamide derivatives    . Tetracycline      Family History  Problem Relation Age of Onset  . Diabetes type II Other   . Kidney cancer Brother   . Cancer Brother     Kidney and pancriatic  . Hypertension Mother   . Varicose Veins Mother   . Peripheral vascular disease Mother   . Diabetes Sister   . Heart disease Father     History   Social History  . Marital Status: Widowed    Spouse Name: N/A    Number of Children: N/A  . Years of Education: N/A   Occupational History  . Retired    Social History Main Topics  . Smoking status: Never Smoker   . Smokeless tobacco: Never Used  . Alcohol Use: No  . Drug Use: No  . Sexual Activity: No   Other Topics Concern  . Not on file   Social History Narrative  . No narrative on file    Review of Systems: Ten point ROS is O/W negative except as mentioned in HPI.  Physical Exam: Vital signs in last 24 hours: Temp:  [97.1 F (36.2 C)-98.4 F (36.9 C)] 97.1 F (36.2 C) (03/21 0939) Pulse Rate:  [75-89] 89 (03/21 0939) Resp:  [19-28] 28 (03/21 0939) BP: (113-186)/(43-83) 146/48 mmHg (03/21 0939) SpO2:  [97 %-100 %] 100 % (03/21 0939) Last BM Date: 06/27/13 General:  Alert, thin and frail, pleasant and cooperative in NAD Head:  Normocephalic and atraumatic. Eyes:  Sclera clear, no icterus.  Conjunctiva pink. Ears:  Normal auditory acuity. Mouth:  No deformity or lesions.   Lungs:  Inspiratory wheezing heard B/L. Heart:  Regular rate and rhythm. Abdomen:  Soft, non-distended.  BS present.  Non-tender.   Rectal:  Deferred  Msk:  Symmetrical without gross deformities. Pulses:  Normal pulses noted. Extremities:  Without clubbing or edema. Neurologic:  Alert and  oriented x4;  Mild right hemiparesis. Skin:  Intact without significant lesions or rashes. Psych:  Alert and cooperative.  Normal mood and affect.  Intake/Output from previous day: 03/20 0701 - 03/21 0700 In: 363 [P.O.:360; I.V.:3] Out:  -   Lab Results:  Recent Labs  06/28/13 0739 06/29/13 0539  WBC 5.7 7.4  HGB 11.7* 11.7*  HCT 35.7* 36.0  PLT 223 260   BMET  Recent Labs  06/28/13 0739 06/29/13 0539  NA 139 140  K 4.5 4.2  CL 102 102  CO2 27 21  GLUCOSE 106* 135*  BUN 18 17  CREATININE 0.73 0.68  CALCIUM 9.6 10.3   Studies/Results: Ct Head W Wo Contrast  06/28/2013   CLINICAL DATA:  Right-sided hemiparesis, recent diagnosis of colon cancer  EXAM: CT HEAD WITHOUT AND WITH CONTRAST  TECHNIQUE: Contiguous axial images were obtained from the base of the skull through the vertex without and with intravenous contrast  CONTRAST:  54mL OMNIPAQUE IOHEXOL 300 MG/ML  SOLN  COMPARISON:  CT ABD/PELV WO CM dated 01/03/2013; CT CHEST W/CM dated 06/26/2013  FINDINGS: There is an approximately 2.4 x 1.9 cm peripherally enhancing mass within the high left frontoparietal lobe (image 21, series 3) with associated adjacent vasogenic edema, which given history of malignancy is worrisome for metastatic disease. No additional discrete enhancing intraparenchymal or extra-axial mass.  Mild atrophy with sulcal prominence. Scattered periventricular hypodensities suggestive of microvascular ischemic disease. No definite intraparenchymal or extra-axial hemorrhage. Normal size and configuration of the ventricles and basilar cisterns. No midline shift. There is underpneumatization of the right frontal sinus. Remaining paranasal sinuses and mastoid air cells are normally aerated. Intracranial atherosclerosis. Post bilateral cataract surgery. Regional soft tissues appear normal. No displaced calvarial fracture.  IMPRESSION: 1. Peripherally enhancing approximately 2.4 cm intraparenchymal mass within the left frontoparietal lobe with associated vasogenic edema, which in the presence of a known malignancy is worrisome for a metastasis. Further evaluation with brain MRI could be performed as clinically indicated. 2. No definitive evidence of  intraparenchymal or extra-axial hemorrhage or infarct.   Electronically Signed   By: Sandi Mariscal M.D.   On: 06/28/2013 09:17   Ct Abdomen Pelvis W Contrast  06/29/2013   CLINICAL DATA:  Recent CT chest demonstrating a mass in the splenic hilum and abnormal gastric antrum.  EXAM: CT ABDOMEN AND PELVIS WITH CONTRAST  TECHNIQUE: Multidetector CT imaging of the abdomen and pelvis was performed using the standard protocol following bolus administration of intravenous contrast.  CONTRAST:  29mL OMNIPAQUE IOHEXOL 300 MG/ML IV.  COMPARISON:  CT chest 06/26/2013. CT abdomen and pelvis 01/03/2013, 01/06/2011, 10/21/2009, 09/13/2008.  FINDINGS: Approximate 3.0 x 2.3 x 1.2 cm mass involving the superior wall of the gastric fundus. Marked thickening of the rugal folds of the gastric fundus and the proximal body of the stomach. Thickening of the wall of the gastric fundus has been present previously and is not felt to be pathologic. Normal appearing small bowel. Large stool burden throughout the normal appearing colon. No ascites.  Heterogeneous mass likely representing necrotic lymph nodes in the splenic hilum, adjacent to the gastric fundus, measuring approximately 2.9 x 2.7 x 2.6 cm. No significant lymphadenopathy elsewhere. Approximate 1.1 x 1.3 x 1.5 cm nodule arising from the left adrenal gland with Hounsfield measurements in the 80s, new since the most recent prior CT abdomen and pelvis in September, 2014. Normal-appearing right adrenal gland. Normal liver in spleen. High attenuation material in the gallbladder likely represents spurious excretion of contrast from the enhanced CT head performed earlier today. No biliary ductal dilation. Non obstructing approximate 4 mm calculus in  an upper pole calix of the right kidney. Mild diffuse cortical thinning involving both kidneys, consistent with age. Bilateral extrarenal pelves. No significant abnormality involving either kidney. Moderate to severe aortoiliac atherosclerosis  without aneurysm.  Uterus surgically absent. Contrast material in the urinary bladder from the enhanced CT head earlier today. Numerous surgical clips in the pelvis. No adnexal masses or free pelvic fluid. Calcifications in the subcutaneous fat of the midline of the pelvis is likely dystrophic and related to prior injections.  Bone window images demonstrate generalized osseous demineralization, prior T12 and L1 augmentation, degenerative disc disease, spondylosis, and facet degenerative changes throughout the lumbar spine, and mild degenerative changes in the hips. Visualized lung bases emphysematous with scarring and bronchiectasis in the lingula and right middle lobe, and a calcified granuloma in the lingula. Tree in bud airspace opacities are present in the right lower lobe, not present on the CT chest yesterday. The peribronchovascular nodularity and ground-glass in the lingula and right middle lobe are unchanged.  IMPRESSION: 1. Approximate 3 cm mass involving the superior wall of the gastric fundus, worrisome for gastric carcinoma or GIST. Thickening of the rugal folds of the fundus and proximal body of the stomach is consistent with associated inflammatory changes. 2. Necrotic nodal mass at the splenic hilum, situated between the spleen and the gastric fundus, approximating 3 cm. No significant lymphadenopathy elsewhere in the abdomen or pelvis. 3. 1.5 cm nodule arising from the left adrenal gland, new since the September, 2014 CT, therefore likely metastatic. 4. New tree-in-bud airspace opacities in the right lower lobe since the CT chest 2 days ago. Stable peribronchovascular nodularity and ground-glass opacity in the lingula and and right middle lobe. Stable bronchiectasis in the lingula and right middle lobe. 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 5. Non obstructing approximate 4 mm calculus in an upper pole calix of the right kidney.   Electronically Signed   By: Evangeline Dakin M.D.   On: 06/29/2013 00:11    IMPRESSION:  -Intracranial mass suspicious for metastatic lesion from unknown primary. CT scans show presumed metastatic disease to lungs, splenic hilum and left adrenal and a gastric mass concerning for gastric carcinoma or GIST.   -History of colon cancer in 1996 s/p left colectomy  PLAN: -Plans per neurosurgery for resection of the brain mass on 3/27, which will give tissue diagnosis as to the source of the primary malignancy.  At this point we are unsure what endoscopy would add to the management given apparent widespread metastatic disease.  Unless important decisions will be based on EGD findings we will defer endoscopy for now.   ZEHR, JESSICA D.  06/29/2013, 10:15 AM  Pager number [phone]    Attending physician's note   I have taken a history, examined the patient and reviewed the chart. I agree with the Advanced Practitioner's note, impression and recommendations. 80 year old thin, frail, female with apparent widespread metastatic disease to brain, lungs, left adrenal gland and splenic hilum, primary site not clear. Gastric mass could be gastric carcinoma, which is a potential primary site, or a GIST. Unless important mgmt decisions will be based on EGD findings we will defer endoscopy for now. Await oncology consult.  Ladene Artist, MD [facility]

## 2013-06-29 NOTE — Progress Notes (Signed)
Subjective: Patient reports 0  Objective: Vital signs in last 24 hours: Temp:  [97.1 F (36.2 C)-98 F (36.7 C)] 97.1 F (36.2 C) (03/21 0939) Pulse Rate:  [75-89] 89 (03/21 0939) Resp:  [20-28] 28 (03/21 0939) BP: (113-170)/(43-83) 146/48 mmHg (03/21 0939) SpO2:  [97 %-100 %] 100 % (03/21 0939)  Intake/Output from previous day: 03/20 0701 - 03/21 0700 In: 363 [P.O.:360; I.V.:3] Out: -  Intake/Output this shift:    0  Lab Results:  Recent Labs  06/28/13 0739 06/29/13 0539  WBC 5.7 7.4  HGB 11.7* 11.7*  HCT 35.7* 36.0  PLT 223 260   BMET  Recent Labs  06/28/13 0739 06/29/13 0539  NA 139 140  K 4.5 4.2  CL 102 102  CO2 27 21  GLUCOSE 106* 135*  BUN 18 17  CREATININE 0.73 0.68  CALCIUM 9.6 10.3    Studies/Results: Ct Head W Wo Contrast  06/28/2013   CLINICAL DATA:  Right-sided hemiparesis, recent diagnosis of colon cancer  EXAM: CT HEAD WITHOUT AND WITH CONTRAST  TECHNIQUE: Contiguous axial images were obtained from the base of the skull through the vertex without and with intravenous contrast  CONTRAST:  45mL OMNIPAQUE IOHEXOL 300 MG/ML  SOLN  COMPARISON:  CT ABD/PELV WO CM dated 01/03/2013; CT CHEST W/CM dated 06/26/2013  FINDINGS: There is an approximately 2.4 x 1.9 cm peripherally enhancing mass within the high left frontoparietal lobe (image 21, series 3) with associated adjacent vasogenic edema, which given history of malignancy is worrisome for metastatic disease. No additional discrete enhancing intraparenchymal or extra-axial mass.  Mild atrophy with sulcal prominence. Scattered periventricular hypodensities suggestive of microvascular ischemic disease. No definite intraparenchymal or extra-axial hemorrhage. Normal size and configuration of the ventricles and basilar cisterns. No midline shift. There is underpneumatization of the right frontal sinus. Remaining paranasal sinuses and mastoid air cells are normally aerated. Intracranial atherosclerosis. Post  bilateral cataract surgery. Regional soft tissues appear normal. No displaced calvarial fracture.  IMPRESSION: 1. Peripherally enhancing approximately 2.4 cm intraparenchymal mass within the left frontoparietal lobe with associated vasogenic edema, which in the presence of a known malignancy is worrisome for a metastasis. Further evaluation with brain MRI could be performed as clinically indicated. 2. No definitive evidence of intraparenchymal or extra-axial hemorrhage or infarct.   Electronically Signed   By: Sandi Mariscal M.D.   On: 06/28/2013 09:17   Ct Abdomen Pelvis W Contrast  06/29/2013   CLINICAL DATA:  Recent CT chest demonstrating a mass in the splenic hilum and abnormal gastric antrum.  EXAM: CT ABDOMEN AND PELVIS WITH CONTRAST  TECHNIQUE: Multidetector CT imaging of the abdomen and pelvis was performed using the standard protocol following bolus administration of intravenous contrast.  CONTRAST:  87mL OMNIPAQUE IOHEXOL 300 MG/ML IV.  COMPARISON:  CT chest 06/26/2013. CT abdomen and pelvis 01/03/2013, 01/06/2011, 10/21/2009, 09/13/2008.  FINDINGS: Approximate 3.0 x 2.3 x 1.2 cm mass involving the superior wall of the gastric fundus. Marked thickening of the rugal folds of the gastric fundus and the proximal body of the stomach. Thickening of the wall of the gastric fundus has been present previously and is not felt to be pathologic. Normal appearing small bowel. Large stool burden throughout the normal appearing colon. No ascites.  Heterogeneous mass likely representing necrotic lymph nodes in the splenic hilum, adjacent to the gastric fundus, measuring approximately 2.9 x 2.7 x 2.6 cm. No significant lymphadenopathy elsewhere. Approximate 1.1 x 1.3 x 1.5 cm nodule arising from the left adrenal gland  with Hounsfield measurements in the 80s, new since the most recent prior CT abdomen and pelvis in September, 2014. Normal-appearing right adrenal gland. Normal liver in spleen. High attenuation material in  the gallbladder likely represents spurious excretion of contrast from the enhanced CT head performed earlier today. No biliary ductal dilation. Non obstructing approximate 4 mm calculus in an upper pole calix of the right kidney. Mild diffuse cortical thinning involving both kidneys, consistent with age. Bilateral extrarenal pelves. No significant abnormality involving either kidney. Moderate to severe aortoiliac atherosclerosis without aneurysm.  Uterus surgically absent. Contrast material in the urinary bladder from the enhanced CT head earlier today. Numerous surgical clips in the pelvis. No adnexal masses or free pelvic fluid. Calcifications in the subcutaneous fat of the midline of the pelvis is likely dystrophic and related to prior injections.  Bone window images demonstrate generalized osseous demineralization, prior T12 and L1 augmentation, degenerative disc disease, spondylosis, and facet degenerative changes throughout the lumbar spine, and mild degenerative changes in the hips. Visualized lung bases emphysematous with scarring and bronchiectasis in the lingula and right middle lobe, and a calcified granuloma in the lingula. Tree in bud airspace opacities are present in the right lower lobe, not present on the CT chest yesterday. The peribronchovascular nodularity and ground-glass in the lingula and right middle lobe are unchanged.  IMPRESSION: 1. Approximate 3 cm mass involving the superior wall of the gastric fundus, worrisome for gastric carcinoma or GIST. Thickening of the rugal folds of the fundus and proximal body of the stomach is consistent with associated inflammatory changes. 2. Necrotic nodal mass at the splenic hilum, situated between the spleen and the gastric fundus, approximating 3 cm. No significant lymphadenopathy elsewhere in the abdomen or pelvis. 3. 1.5 cm nodule arising from the left adrenal gland, new since the September, 2014 CT, therefore likely metastatic. 4. New tree-in-bud  airspace opacities in the right lower lobe since the CT chest 2 days ago. Stable peribronchovascular nodularity and ground-glass opacity in the lingula and and right middle lobe. Stable bronchiectasis in the lingula and right middle lobe. 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 5. Non obstructing approximate 4 mm calculus in an upper pole calix of the right kidney.   Electronically Signed   By: Evangeline Dakin M.D.   On: 06/29/2013 00:11    Assessment/Plan: Patient not seen by me this day   LOS: 1 day  disregard note   Makiyah Zentz J 06/29/2013, 11:59 AM

## 2013-06-29 NOTE — Progress Notes (Signed)
INITIAL NUTRITION ASSESSMENT  DOCUMENTATION CODES Per approved criteria  -Not Applicable   INTERVENTION:  Recommend liberalize diet to regular.  Ensure Complete PO BID, each supplement provides 350 kcal and 13 grams of protein  NUTRITION DIAGNOSIS: Inadequate oral intake related to advanced age and poor appetite as evidenced by progressive weight loss over the past 6 months.   Goal: Intake to meet >90% of estimated nutrition needs.  Monitor:  PO intake, labs, weight trend.  Reason for Assessment: MST  80 y.o. female  Admitting Dx: right sided weakness  ASSESSMENT: Patient is an 80 year old woman with history of colon cancer in 1996 and recent CT scan suspicious for gastric carcinoma presented to the emergency department on 3/20 with worsening right arm and right leg tremors and significant weakness when she woke up this morning. Initial evaluation confirmed right-sided weakness and CT of the head revealed intraparenchymal mass with associated vasogenic edema worrisome for metastasis.   Neurosurgery is following for newly diagnosed metastatic malignancy. Patient is underweight with BMI=18.3. Patient reports that she has been eating well at home and in the hospital. Per discussion with family, she eats small amounts at home. Her weight is down ~5 lbs over the past 6 months.  Height: Ht Readings from Last 1 Encounters:  06/28/13 5\' 1"  (1.549 m)    Weight: Wt Readings from Last 1 Encounters:  06/28/13 97 lb (43.999 kg)    Ideal Body Weight: 47.7 kg  % Ideal Body Weight: 92%  Wt Readings from Last 10 Encounters:  06/28/13 97 lb (43.999 kg)  06/28/13 97 lb (43.999 kg)  06/12/13 99 lb (44.906 kg)  04/23/13 98 lb (44.453 kg)  02/07/13 102 lb 8 oz (46.494 kg)  01/17/13 100 lb (45.36 kg)  01/08/13 99 lb (44.906 kg)  12/05/12 106 lb (48.081 kg)  07/06/12 105 lb 12.8 oz (47.991 kg)  06/25/12 106 lb 12 oz (48.421 kg)    Usual Body Weight: 102 lb (6 months ago)  % Usual  Body Weight: 95%  BMI:  Body mass index is 18.34 kg/(m^2). Underweight  Estimated Nutritional Needs: Kcal: 1300-1500 Protein: 80-90 gm Fluid: 1.5 L  Skin: no wounds  Diet Order: Cardiac  EDUCATION NEEDS: -Education not appropriate at this time   Intake/Output Summary (Last 24 hours) at 06/29/13 0933 Last data filed at 06/28/13 1700  Gross per 24 hour  Intake    363 ml  Output      0 ml  Net    363 ml    Last BM: 3/19   Labs:   Recent Labs Lab 06/28/13 0739 06/29/13 0539  NA 139 140  K 4.5 4.2  CL 102 102  CO2 27 21  BUN 18 17  CREATININE 0.73 0.68  CALCIUM 9.6 10.3  GLUCOSE 106* 135*    CBG (last 3)  No results found for this basename: GLUCAP,  in the last 72 hours  Scheduled Meds: . aspirin EC  81 mg Oral QODAY  . dexamethasone  4 mg Intravenous 4 times per day  . levETIRAcetam  500 mg Oral BID  . losartan  50 mg Oral Daily  . pantoprazole  40 mg Oral Daily  . sodium chloride  3 mL Intravenous Q12H  . sodium chloride  3 mL Intravenous Q12H    Continuous Infusions:   Past Medical History  Diagnosis Date  . Arteriosclerotic cardiovascular disease (ASCVD) 1994     Stent to RCA and LAD at [facility] in 1994; neg. stress nuclear 12/98; 04/2005:40%  LAD; 70% CX;  echocardiogram in 2009-normal EF; mild to moderate MR.; 01/2011  Non-ST segment elevation MI->  BMS to the distal LAD ,40% in-stent stenosis in proximal LAD, 90% proximal OM1, 40-50% in-stent RCA stenosis, 50% PDA  . Cerebrovascular disease     bilateral bruits; 7/08-50-69% right and left internal carotid artery stenosis;  2011: 70-80% right; 50% left   . Hypertension   . Hyperlipidemia   . Spinal stenosis   . Colitis 2011    2011  . Degenerative joint disease   . Gastroesophageal reflux disease 2002    Hiatal hernia; dysphasia due to cervical web-dilated in 2002  . Colon carcinoma 1996    left hemicolectomy  . Weight loss 2009    30 pounds 2009-2011  . Mycobacterium avium-intracellulare  infection   . Lung nodule 01/2011    right lower lobe; re-image in 08/2011  . Pneumonia   . Osteoarthritis 02/10/2012  . Back pain October 04, 2012    Pt. fell outside  . MI (myocardial infarction)     2012 with stent [facility] at [facility]    Past Surgical History  Procedure Laterality Date  . Appendectomy    . Partial colectomy  1996    Left for carcinoma  . Kyphosis surgery      Percutaneous  . Tonsillectomy    . Colonoscopy  01/2009; 11/2009    Dr. Rourk-->surgical anastomosis at 18cm. Residual rectal and colonic mucosa appeared normal; Dr. [name] Frieze papilla, surgical anastomosis at 18cm  . Esophagogastroduodenoscopy  01/2009; 11/2009    Dr. Daiva Nakayama cervical esophageal web s/p dilation, small hh; Dr. Trevor Iha hh  . Coronary stent placement  02/02/11  . Bladder suspension    . Cataract extraction      Bilateral  . Fracture surgery  10-04-12    Fx back T-12 and L-1  . Total abdominal hysterectomy  1974  . Eye surgery      Molli Barrows, RD, LDN, [place] Pager [phone] After Hours Pager [phone]

## 2013-06-29 NOTE — Progress Notes (Signed)
[facility] PROGRESS NOTE  Kayla Lane HYQ:[id number] DOB: Jun 12, 1925 DOA: 06/28/2013 PCP: Alonza Bogus, MD  Assessment/Plan: Intracranial mass -Likely a metastatic lesion from GI source -CT brain on 06/28/2013 showed 2.4 cm ring-enhancing lesion in the left frontal lobe with vasogenic edema -Appreciate neurosurgery followup -Plans for radiosurgery simulation on Monday, 07/01/2013 -Continue Decadron per neurosurgery recommendations -Continue Keppra -Stereotactic radiosurgery scheduled for Wednesday, 07/03/2013 Gastric mass -Concerning for gastric carcinoma -Dr. Sherwood Gambler has contacted Dr. Humphrey Rolls ([place]) for consultation -CT abdomen and pelvis shows a 3 cm mass in the gastric fundus with thickened rugal folds; necrotic splenic hilar mass--3 cm; 1.5 cm left adrenal nodule -consult GI--spoke with Dr. Fuller Plan CAD with history of MI -PCI with stents in 2012 -continue ASA COPD -Stable History of colon cancer -Status post left colectomy 1996 -Patient did not receive radiation or chemotherapy History of MAI with bronchiectasis -Stable  Family Communication:   Pt at beside Disposition Plan:   Home when medically stable       Procedures/Studies: Dg Chest 2 View  06/18/2013   CLINICAL DATA Chest heaviness, COPD, chest pain for 2 years, history coronary disease post MI and stenting, neck pain radiating to right arm, past history colon cancer, MAI infection  EXAM CHEST  2 VIEW  COMPARISON 01/08/2013, 07/10/2012  FINDINGS Borderline enlargement of cardiac silhouette.  Mediastinal contours and pulmonary vascularity normal.  Right upper lobe nodular density again identified, approximately 20 x 13 mm, increased since 07/10/2012.  New left perihilar mass 30 x 18 mm.  Underlying emphysematous and bronchitic changes consistent with COPD.  Questionable 10 mm right upper lobe nodule.  No definite acute infiltrate, pleural effusion or pneumothorax.  Bones diffusely demineralized with  evidence of prior spinal augmentation procedures at adjacent levels at the thoracolumbar junction.  IMPRESSION COPD changes.  Increased right upper lobe and new left perihilar nodules.  CT chest recommended for further evaluation, with contrast if patient's renal function permits.  No acute infiltrate.  Findings discussed with Dr. Luan Pulling at time of interpretation, 1500 hr on 06/18/2013.  SIGNATURE  Electronically Signed   By: Lavonia Dana M.D.   On: 06/18/2013 15:05   Dg Cervical Spine Complete  06/18/2013   CLINICAL DATA Right arm shaking, neck pain radiating down right arm, no known injury, right arm numbness, chest heaviness  EXAM CERVICAL SPINE  4+ VIEWS  COMPARISON None  FINDINGS Osseous demineralization.  Disc space narrowing C5-C6.  Vertebral body and disc space heights otherwise maintained.  Mild scattered facet degenerative changes.  Prevertebral soft tissues normal thickness.  Bony foramina grossly patent.  Spur tiny uncovertebral spurs noted at C5-C6 bilaterally.  No acute fracture, subluxation or bone destruction.  Right upper lobe nodular density, see chest radiograph of same date.  C1-C2 alignment normal.  IMPRESSION Mild degenerative disc and facet disease changes as above.  Right upper lobe pulmonary nodule, please refer to chest radiograph report of same date.  SIGNATURE  Electronically Signed   By: Lavonia Dana M.D.   On: 06/18/2013 15:07   Ct Head W Wo Contrast  06/28/2013   CLINICAL DATA:  Right-sided hemiparesis, recent diagnosis of colon cancer  EXAM: CT HEAD WITHOUT AND WITH CONTRAST  TECHNIQUE: Contiguous axial images were obtained from the base of the skull through the vertex without and with intravenous contrast  CONTRAST:  59mL OMNIPAQUE IOHEXOL 300 MG/ML  SOLN  COMPARISON:  CT ABD/PELV WO CM dated 01/03/2013; CT CHEST W/CM dated 06/26/2013  FINDINGS: There is an approximately 2.4 x  1.9 cm peripherally enhancing mass within the high left frontoparietal lobe (image 21, series 3) with  associated adjacent vasogenic edema, which given history of malignancy is worrisome for metastatic disease. No additional discrete enhancing intraparenchymal or extra-axial mass.  Mild atrophy with sulcal prominence. Scattered periventricular hypodensities suggestive of microvascular ischemic disease. No definite intraparenchymal or extra-axial hemorrhage. Normal size and configuration of the ventricles and basilar cisterns. No midline shift. There is underpneumatization of the right frontal sinus. Remaining paranasal sinuses and mastoid air cells are normally aerated. Intracranial atherosclerosis. Post bilateral cataract surgery. Regional soft tissues appear normal. No displaced calvarial fracture.  IMPRESSION: 1. Peripherally enhancing approximately 2.4 cm intraparenchymal mass within the left frontoparietal lobe with associated vasogenic edema, which in the presence of a known malignancy is worrisome for a metastasis. Further evaluation with brain MRI could be performed as clinically indicated. 2. No definitive evidence of intraparenchymal or extra-axial hemorrhage or infarct.   Electronically Signed   By: Sandi Mariscal M.D.   On: 06/28/2013 09:17   Ct Chest W Contrast  06/26/2013   CLINICAL DATA:  Weight loss, pulmonary nodule.  EXAM: CT CHEST WITH CONTRAST  TECHNIQUE: Multidetector CT imaging of the chest was performed during intravenous contrast administration.  CONTRAST:  19mL OMNIPAQUE IOHEXOL 300 MG/ML  SOLN  COMPARISON:  DG CHEST 2 VIEW dated 06/18/2013; CT CHEST W/CM dated 01/25/2011; CT CHEST W/CM dated 11/05/2009; CT CHEST W/O CM dated 10/31/2008; CT ABD/PELV WO CM dated 01/03/2013  FINDINGS: Mediastinal lymph nodes measure up to 8 mm in the low right paratracheal station, unchanged. Right hilar lymph node measures 9 mm. No left hilar or axillary adenopathy. Surgical clips in the left axilla. Coronary artery calcification. Heart size normal. No pericardial effusion.  Lobulated pulmonary nodules measure up  to 1.6 x 2.5 cm in the superior segment left lower lobe (image 30) and are new from 01/25/2011. There is an underlying pattern of mildly progressive bronchiectasis, peribronchovascular nodularity and ground-glass, predominating in the right middle lobe and lingula. No pleural fluid. Airway is unremarkable.  Incidental imaging of the upper abdomen shows the visualized portion of the liver, gallbladder and right adrenal gland to be grossly unremarkable. There is a new 1.0 x 1.6 cm nodule in the lateral limb left adrenal gland. There may be stones in the right kidney with renal cortical scarring bilaterally. Spleen is unremarkable. However, a heterogeneous mass in the splenic hilum measures 2.6 x 2.7 cm, new. The proximal gastric wall appears thickened and there may be a low-attenuation mass along the posterior wall, measuring approximately 1.6 x 2.5 cm (image 51). No upper abdominal adenopathy. No worrisome lytic or sclerotic lesions. Vertebroplasties.  IMPRESSION: 1. New lobulated bilateral pulmonary nodules, new left adrenal nodule and new heterogeneous mass in the splenic hilum, most indicative of metastatic disease. Given the masslike area of low attenuation in the posterior gastric wall with apparent gastric wall thickening, findings are suspicious for gastric carcinoma. 2. Coronary artery calcification. 3. Underlying pattern of mildly progressive bronchiectasis, peribronchovascular nodularity and ground-glass, indicative of mycobacterium avium complex (MAC). 4. Question right renal stones.   Electronically Signed   By: Lorin Picket M.D.   On: 06/26/2013 17:31   Ct Abdomen Pelvis W Contrast  06/29/2013   CLINICAL DATA:  Recent CT chest demonstrating a mass in the splenic hilum and abnormal gastric antrum.  EXAM: CT ABDOMEN AND PELVIS WITH CONTRAST  TECHNIQUE: Multidetector CT imaging of the abdomen and pelvis was performed using the standard protocol following bolus  administration of intravenous contrast.   CONTRAST:  60mL OMNIPAQUE IOHEXOL 300 MG/ML IV.  COMPARISON:  CT chest 06/26/2013. CT abdomen and pelvis 01/03/2013, 01/06/2011, 10/21/2009, 09/13/2008.  FINDINGS: Approximate 3.0 x 2.3 x 1.2 cm mass involving the superior wall of the gastric fundus. Marked thickening of the rugal folds of the gastric fundus and the proximal body of the stomach. Thickening of the wall of the gastric fundus has been present previously and is not felt to be pathologic. Normal appearing small bowel. Large stool burden throughout the normal appearing colon. No ascites.  Heterogeneous mass likely representing necrotic lymph nodes in the splenic hilum, adjacent to the gastric fundus, measuring approximately 2.9 x 2.7 x 2.6 cm. No significant lymphadenopathy elsewhere. Approximate 1.1 x 1.3 x 1.5 cm nodule arising from the left adrenal gland with Hounsfield measurements in the 80s, new since the most recent prior CT abdomen and pelvis in September, 2014. Normal-appearing right adrenal gland. Normal liver in spleen. High attenuation material in the gallbladder likely represents spurious excretion of contrast from the enhanced CT head performed earlier today. No biliary ductal dilation. Non obstructing approximate 4 mm calculus in an upper pole calix of the right kidney. Mild diffuse cortical thinning involving both kidneys, consistent with age. Bilateral extrarenal pelves. No significant abnormality involving either kidney. Moderate to severe aortoiliac atherosclerosis without aneurysm.  Uterus surgically absent. Contrast material in the urinary bladder from the enhanced CT head earlier today. Numerous surgical clips in the pelvis. No adnexal masses or free pelvic fluid. Calcifications in the subcutaneous fat of the midline of the pelvis is likely dystrophic and related to prior injections.  Bone window images demonstrate generalized osseous demineralization, prior T12 and L1 augmentation, degenerative disc disease, spondylosis, and facet  degenerative changes throughout the lumbar spine, and mild degenerative changes in the hips. Visualized lung bases emphysematous with scarring and bronchiectasis in the lingula and right middle lobe, and a calcified granuloma in the lingula. Tree in bud airspace opacities are present in the right lower lobe, not present on the CT chest yesterday. The peribronchovascular nodularity and ground-glass in the lingula and right middle lobe are unchanged.  IMPRESSION: 1. Approximate 3 cm mass involving the superior wall of the gastric fundus, worrisome for gastric carcinoma or GIST. Thickening of the rugal folds of the fundus and proximal body of the stomach is consistent with associated inflammatory changes. 2. Necrotic nodal mass at the splenic hilum, situated between the spleen and the gastric fundus, approximating 3 cm. No significant lymphadenopathy elsewhere in the abdomen or pelvis. 3. 1.5 cm nodule arising from the left adrenal gland, new since the September, 2014 CT, therefore likely metastatic. 4. New tree-in-bud airspace opacities in the right lower lobe since the CT chest 2 days ago. Stable peribronchovascular nodularity and ground-glass opacity in the lingula and and right middle lobe. Stable bronchiectasis in the lingula and right middle lobe. 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 5. Non obstructing approximate 4 mm calculus in an upper pole calix of the right kidney.   Electronically Signed   By: Evangeline Dakin M.D.   On: 06/29/2013 00:11         Subjective: Patient complains of a headache this morning without any visual disturbance. She denies any chest discomfort, nausea, vomiting, diarrhea, abdominal pain. She has some shortness of breath. She has a nonproductive cough. Denies fevers, chills, dysuria and hematuria. No hematochezia or melena.  Objective: Filed Vitals:   06/28/13 1854 06/28/13 2117 06/29/13 0127 06/29/13  0528  BP: 138/43 137/49 113/70 140/71   Pulse: 77 75 85 83  Temp: 97.9 F (36.6 C) 97.4 F (36.3 C) 97.1 F (36.2 C) 97.2 F (36.2 C)  TempSrc: Oral Oral Oral Oral  Resp: 20 20 20 20   Height:      Weight:      SpO2: 97% 100% 100% 99%    Intake/Output Summary (Last 24 hours) at 06/29/13 0913 Last data filed at 06/28/13 1700  Gross per 24 hour  Intake    363 ml  Output      0 ml  Net    363 ml   Weight change:  Exam:   General:  Pt is alert, follows commands appropriately, not in acute distress  HEENT: No icterus, No thrush, [place]/AT  Cardiovascular: RRR, S1/S2, no rubs, no gallops  Respiratory: Fine bibasilar crackles. No wheezing.  Abdomen: Soft/+BS, non tender, non distended, no guarding  Extremities: No edema, No lymphangitis, No petechiae, No rashes, no synovitis  Data Reviewed: Basic Metabolic Panel:  Recent Labs Lab 06/28/13 0739 06/29/13 0539  NA 139 140  K 4.5 4.2  CL 102 102  CO2 27 21  GLUCOSE 106* 135*  BUN 18 17  CREATININE 0.73 0.68  CALCIUM 9.6 10.3   Liver Function Tests: No results found for this basename: AST, ALT, ALKPHOS, BILITOT, PROT, ALBUMIN,  in the last 168 hours No results found for this basename: LIPASE, AMYLASE,  in the last 168 hours No results found for this basename: AMMONIA,  in the last 168 hours CBC:  Recent Labs Lab 06/28/13 0739 06/29/13 0539  WBC 5.7 7.4  NEUTROABS 3.5  --   HGB 11.7* 11.7*  HCT 35.7* 36.0  MCV 93.0 91.8  PLT 223 260   Cardiac Enzymes: No results found for this basename: CKTOTAL, CKMB, CKMBINDEX, TROPONINI,  in the last 168 hours BNP: No components found with this basename: POCBNP,  CBG: No results found for this basename: GLUCAP,  in the last 168 hours  No results found for this or any previous visit (from the past 240 hour(s)).   Scheduled Meds: . aspirin EC  81 mg Oral QODAY  . dexamethasone  4 mg Intravenous 4 times per day  . levETIRAcetam  500 mg Oral BID  . losartan  50 mg Oral Daily  . pantoprazole  40 mg Oral Daily   . sodium chloride  3 mL Intravenous Q12H  . sodium chloride  3 mL Intravenous Q12H   Continuous Infusions:    Madalen Gavin, DO  [facility] Pager [phone]  If 7PM-7AM, please contact night-coverage [email or web address] Password [facility] 06/29/2013, 9:13 AM   LOS: 1 day

## 2013-06-29 NOTE — Progress Notes (Signed)
Patient ID: Kayla Lane, female   DOB: [birth year], 80 y.o.   MRN: [id number] Patient is stable. She is out of bed to chair and moving all extremities. She is awake and alert and pleasant and conversive . She does complain of some headache. Plan as per Dr. Donnella Bi note yesterday.

## 2013-06-30 ENCOUNTER — Other Ambulatory Visit: Payer: PRIVATE HEALTH INSURANCE

## 2013-06-30 ENCOUNTER — Other Ambulatory Visit: Payer: Self-pay

## 2013-06-30 ENCOUNTER — Encounter: Payer: Self-pay | Admitting: Radiation Oncology

## 2013-06-30 DIAGNOSIS — D496 Neoplasm of unspecified behavior of brain: Secondary | ICD-10-CM

## 2013-06-30 DIAGNOSIS — R9431 Abnormal electrocardiogram [ECG] [EKG]: Secondary | ICD-10-CM

## 2013-06-30 NOTE — Progress Notes (Signed)
I spoke with the patient's family including his son, daughter, granddaughter at the bedside. They expressed concern regarding the patient's gradual decline in functional status over the past 10 months. She also expressed concern regarding the patient's ability to tolerate the upcoming surgery for her brain metastasis.  They are concerned about what quality of life the patient will have left in light of radiation and neurosurgery ahead. The case was also discussed with medical oncology, Dr. Tami Lin who also saw the patient today.  After the family's discussion with Dr. Humphrey Rolls, they told me they wanted to cancel any further surgical intervention.  They also wanted to cancel any further evaluation for radiation treatment and were interested in palliative medicine consultation.  I contacted palliative medicine for official consultation.  I also spoke with Dr. Tammi Klippel to request cancellation of tomorrow's radiosurgery simulation.  Family also did not want any further cardiac intervention regarding her abnormal EKG.  I informed Dr. Stanford Breed.   Total time spent with patient and coordinating care 38min DTat

## 2013-06-30 NOTE — Progress Notes (Signed)
[facility] PROGRESS NOTE  Kayla Lane ELF:[id number] DOB: 11-Aug-1925 DOA: 06/28/2013 PCP: Alonza Bogus, MD  Assessment/Plan: Intracranial mass  -Likely a metastatic lesion from GI source  -CT brain on 06/28/2013 showed 2.4 cm ring-enhancing lesion in the left frontal lobe with vasogenic edema  -MRI brain--2.2 cm left anterior parietal lobe enhancing lesion with vasogenic edema -Appreciate neurosurgery followup  -Plans for radiosurgery simulation on Monday, 07/01/2013  -Continue Decadron per neurosurgery recommendations  -Continue Keppra  -Stereotactic radiosurgery scheduled for Wednesday, 07/03/2013  Gastric mass  -Concerning for gastric carcinoma  -Dr. Sherwood Gambler has contacted Dr. Humphrey Rolls ([place]) for consultation  -CT abdomen and pelvis shows a 3 cm mass in the gastric fundus with thickened rugal folds; necrotic splenic hilar mass--3 cm; 1.5 cm left adrenal nodule  -appreciate Dr. Fuller Plan input CAD with history of MI  -PCI with stents ([facility]) in 2012 to LAD for instent stenosis  -continue ASA  Abnormal EKG -New T-wave inversions II, III, aVF -consulted cardiology -pt is asymptomatic COPD  -Stable  History of colon cancer  -Status post left colectomy 1996  -Patient did not receive radiation or chemotherapy  History of MAI with bronchiectasis  -Stable  Family Communication: updated daughter Disposition Plan: Home when medically stable          Procedures/Studies: Dg Chest 2 View  06/18/2013   CLINICAL DATA Chest heaviness, COPD, chest pain for 2 years, history coronary disease post MI and stenting, neck pain radiating to right arm, past history colon cancer, MAI infection  EXAM CHEST  2 VIEW  COMPARISON 01/08/2013, 07/10/2012  FINDINGS Borderline enlargement of cardiac silhouette.  Mediastinal contours and pulmonary vascularity normal.  Right upper lobe nodular density again identified, approximately 20 x 13 mm, increased since 07/10/2012.  New left perihilar mass  30 x 18 mm.  Underlying emphysematous and bronchitic changes consistent with COPD.  Questionable 10 mm right upper lobe nodule.  No definite acute infiltrate, pleural effusion or pneumothorax.  Bones diffusely demineralized with evidence of prior spinal augmentation procedures at adjacent levels at the thoracolumbar junction.  IMPRESSION COPD changes.  Increased right upper lobe and new left perihilar nodules.  CT chest recommended for further evaluation, with contrast if patient's renal function permits.  No acute infiltrate.  Findings discussed with Dr. Luan Pulling at time of interpretation, 1500 hr on 06/18/2013.  SIGNATURE  Electronically Signed   By: Lavonia Dana M.D.   On: 06/18/2013 15:05   Dg Cervical Spine Complete  06/18/2013   CLINICAL DATA Right arm shaking, neck pain radiating down right arm, no known injury, right arm numbness, chest heaviness  EXAM CERVICAL SPINE  4+ VIEWS  COMPARISON None  FINDINGS Osseous demineralization.  Disc space narrowing C5-C6.  Vertebral body and disc space heights otherwise maintained.  Mild scattered facet degenerative changes.  Prevertebral soft tissues normal thickness.  Bony foramina grossly patent.  Spur tiny uncovertebral spurs noted at C5-C6 bilaterally.  No acute fracture, subluxation or bone destruction.  Right upper lobe nodular density, see chest radiograph of same date.  C1-C2 alignment normal.  IMPRESSION Mild degenerative disc and facet disease changes as above.  Right upper lobe pulmonary nodule, please refer to chest radiograph report of same date.  SIGNATURE  Electronically Signed   By: Lavonia Dana M.D.   On: 06/18/2013 15:07   Ct Head W Wo Contrast  06/28/2013   CLINICAL DATA:  Right-sided hemiparesis, recent diagnosis of colon cancer  EXAM: CT HEAD WITHOUT AND WITH CONTRAST  TECHNIQUE: Contiguous axial  images were obtained from the base of the skull through the vertex without and with intravenous contrast  CONTRAST:  58mL OMNIPAQUE IOHEXOL 300 MG/ML   SOLN  COMPARISON:  CT ABD/PELV WO CM dated 01/03/2013; CT CHEST W/CM dated 06/26/2013  FINDINGS: There is an approximately 2.4 x 1.9 cm peripherally enhancing mass within the high left frontoparietal lobe (image 21, series 3) with associated adjacent vasogenic edema, which given history of malignancy is worrisome for metastatic disease. No additional discrete enhancing intraparenchymal or extra-axial mass.  Mild atrophy with sulcal prominence. Scattered periventricular hypodensities suggestive of microvascular ischemic disease. No definite intraparenchymal or extra-axial hemorrhage. Normal size and configuration of the ventricles and basilar cisterns. No midline shift. There is underpneumatization of the right frontal sinus. Remaining paranasal sinuses and mastoid air cells are normally aerated. Intracranial atherosclerosis. Post bilateral cataract surgery. Regional soft tissues appear normal. No displaced calvarial fracture.  IMPRESSION: 1. Peripherally enhancing approximately 2.4 cm intraparenchymal mass within the left frontoparietal lobe with associated vasogenic edema, which in the presence of a known malignancy is worrisome for a metastasis. Further evaluation with brain MRI could be performed as clinically indicated. 2. No definitive evidence of intraparenchymal or extra-axial hemorrhage or infarct.   Electronically Signed   By: Sandi Mariscal M.D.   On: 06/28/2013 09:17   Ct Chest W Contrast  06/26/2013   CLINICAL DATA:  Weight loss, pulmonary nodule.  EXAM: CT CHEST WITH CONTRAST  TECHNIQUE: Multidetector CT imaging of the chest was performed during intravenous contrast administration.  CONTRAST:  20mL OMNIPAQUE IOHEXOL 300 MG/ML  SOLN  COMPARISON:  DG CHEST 2 VIEW dated 06/18/2013; CT CHEST W/CM dated 01/25/2011; CT CHEST W/CM dated 11/05/2009; CT CHEST W/O CM dated 10/31/2008; CT ABD/PELV WO CM dated 01/03/2013  FINDINGS: Mediastinal lymph nodes measure up to 8 mm in the low right paratracheal station,  unchanged. Right hilar lymph node measures 9 mm. No left hilar or axillary adenopathy. Surgical clips in the left axilla. Coronary artery calcification. Heart size normal. No pericardial effusion.  Lobulated pulmonary nodules measure up to 1.6 x 2.5 cm in the superior segment left lower lobe (image 30) and are new from 01/25/2011. There is an underlying pattern of mildly progressive bronchiectasis, peribronchovascular nodularity and ground-glass, predominating in the right middle lobe and lingula. No pleural fluid. Airway is unremarkable.  Incidental imaging of the upper abdomen shows the visualized portion of the liver, gallbladder and right adrenal gland to be grossly unremarkable. There is a new 1.0 x 1.6 cm nodule in the lateral limb left adrenal gland. There may be stones in the right kidney with renal cortical scarring bilaterally. Spleen is unremarkable. However, a heterogeneous mass in the splenic hilum measures 2.6 x 2.7 cm, new. The proximal gastric wall appears thickened and there may be a low-attenuation mass along the posterior wall, measuring approximately 1.6 x 2.5 cm (image 51). No upper abdominal adenopathy. No worrisome lytic or sclerotic lesions. Vertebroplasties.  IMPRESSION: 1. New lobulated bilateral pulmonary nodules, new left adrenal nodule and new heterogeneous mass in the splenic hilum, most indicative of metastatic disease. Given the masslike area of low attenuation in the posterior gastric wall with apparent gastric wall thickening, findings are suspicious for gastric carcinoma. 2. Coronary artery calcification. 3. Underlying pattern of mildly progressive bronchiectasis, peribronchovascular nodularity and ground-glass, indicative of mycobacterium avium complex (MAC). 4. Question right renal stones.   Electronically Signed   By: Lorin Picket M.D.   On: 06/26/2013 17:31   Mr Jeri Cos  Wo Contrast  06/29/2013   CLINICAL DATA:  Brain tumor.  Preoperative evaluation.  EXAM: MRI HEAD  WITHOUT AND WITH CONTRAST  TECHNIQUE: Multiplanar, multiecho pulse sequences of the brain and surrounding structures were obtained without and with intravenous contrast.  CONTRAST:  56mL MULTIHANCE GADOBENATE DIMEGLUMINE 529 MG/ML IV SOLN  COMPARISON:  Head CT 06/28/2013  FINDINGS: There is a solitary brain mass in the left parietal lobe anteriorly measuring 2.2 cm in diameter with surrounding vasogenic edema. Mild mass effect but no midline shift. No second lesion is identified.  Elsewhere, the brain shows age related atrophy with chronic small vessel change within the hemispheric white matter. No hydrocephalus. No extra-axial collection. No pituitary mass. No inflammatory sinus disease. No skull or skullbase lesion. Major vessels are patent at the base of the brain.  IMPRESSION: 2.2 cm in diameter enhancing mass lesion in the anterior left parietal lobe with surrounding vasogenic edema. In a person of this age. This could be a solitary metastasis or a primary brain tumor. No second lesion.   Electronically Signed   By: Nelson Chimes M.D.   On: 06/29/2013 21:09   Ct Abdomen Pelvis W Contrast  06/29/2013   CLINICAL DATA:  Recent CT chest demonstrating a mass in the splenic hilum and abnormal gastric antrum.  EXAM: CT ABDOMEN AND PELVIS WITH CONTRAST  TECHNIQUE: Multidetector CT imaging of the abdomen and pelvis was performed using the standard protocol following bolus administration of intravenous contrast.  CONTRAST:  33mL OMNIPAQUE IOHEXOL 300 MG/ML IV.  COMPARISON:  CT chest 06/26/2013. CT abdomen and pelvis 01/03/2013, 01/06/2011, 10/21/2009, 09/13/2008.  FINDINGS: Approximate 3.0 x 2.3 x 1.2 cm mass involving the superior wall of the gastric fundus. Marked thickening of the rugal folds of the gastric fundus and the proximal body of the stomach. Thickening of the wall of the gastric fundus has been present previously and is not felt to be pathologic. Normal appearing small bowel. Large stool burden throughout  the normal appearing colon. No ascites.  Heterogeneous mass likely representing necrotic lymph nodes in the splenic hilum, adjacent to the gastric fundus, measuring approximately 2.9 x 2.7 x 2.6 cm. No significant lymphadenopathy elsewhere. Approximate 1.1 x 1.3 x 1.5 cm nodule arising from the left adrenal gland with Hounsfield measurements in the 80s, new since the most recent prior CT abdomen and pelvis in September, 2014. Normal-appearing right adrenal gland. Normal liver in spleen. High attenuation material in the gallbladder likely represents spurious excretion of contrast from the enhanced CT head performed earlier today. No biliary ductal dilation. Non obstructing approximate 4 mm calculus in an upper pole calix of the right kidney. Mild diffuse cortical thinning involving both kidneys, consistent with age. Bilateral extrarenal pelves. No significant abnormality involving either kidney. Moderate to severe aortoiliac atherosclerosis without aneurysm.  Uterus surgically absent. Contrast material in the urinary bladder from the enhanced CT head earlier today. Numerous surgical clips in the pelvis. No adnexal masses or free pelvic fluid. Calcifications in the subcutaneous fat of the midline of the pelvis is likely dystrophic and related to prior injections.  Bone window images demonstrate generalized osseous demineralization, prior T12 and L1 augmentation, degenerative disc disease, spondylosis, and facet degenerative changes throughout the lumbar spine, and mild degenerative changes in the hips. Visualized lung bases emphysematous with scarring and bronchiectasis in the lingula and right middle lobe, and a calcified granuloma in the lingula. Tree in bud airspace opacities are present in the right lower lobe, not present on the CT chest  yesterday. The peribronchovascular nodularity and ground-glass in the lingula and right middle lobe are unchanged.  IMPRESSION: 1. Approximate 3 cm mass involving the superior  wall of the gastric fundus, worrisome for gastric carcinoma or GIST. Thickening of the rugal folds of the fundus and proximal body of the stomach is consistent with associated inflammatory changes. 2. Necrotic nodal mass at the splenic hilum, situated between the spleen and the gastric fundus, approximating 3 cm. No significant lymphadenopathy elsewhere in the abdomen or pelvis. 3. 1.5 cm nodule arising from the left adrenal gland, new since the September, 2014 CT, therefore likely metastatic. 4. New tree-in-bud airspace opacities in the right lower lobe since the CT chest 2 days ago. Stable peribronchovascular nodularity and ground-glass opacity in the lingula and and right middle lobe. Stable bronchiectasis in the lingula and right middle lobe. 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 5. Non obstructing approximate 4 mm calculus in an upper pole calix of the right kidney.   Electronically Signed   By: Evangeline Dakin M.D.   On: 06/29/2013 00:11         Subjective: Patient denies any fevers, chills, chest discomfort, shortness breath, nausea, vomiting, diarrhea, abdominal pain. She still has some "unusual" sensations in right upper extremity. No tremors. No focal extremity weakness the  Objective: Filed Vitals:   06/29/13 2145 06/30/13 0127 06/30/13 0624 06/30/13 0940  BP: 145/49 146/52 146/57 139/49  Pulse: 88 84 79 82  Temp: 97.5 F (36.4 C) 97.5 F (36.4 C) 97.9 F (36.6 C) 97.4 F (36.3 C)  TempSrc: Oral Oral Oral Oral  Resp: 20 20 20 19   Height:      Weight:      SpO2: 98% 100% 100% 100%    Intake/Output Summary (Last 24 hours) at 06/30/13 0945 Last data filed at 06/30/13 0130  Gross per 24 hour  Intake    720 ml  Output      0 ml  Net    720 ml   Weight change:  Exam:   General:  Pt is alert, follows commands appropriately, not in acute distress  HEENT: No icterus, No thrush,  [place]/AT  Cardiovascular: RRR, S1/S2, no rubs, no  gallops  Respiratory: Bibasilar rales. No wheezing. Good air movement.  Abdomen: Soft/+BS, non tender, non distended, no guarding  Extremities: No edema, No lymphangitis, No petechiae, No rashes, no synovitis  Data Reviewed: Basic Metabolic Panel:  Recent Labs Lab 06/28/13 0739 06/29/13 0539  NA 139 140  K 4.5 4.2  CL 102 102  CO2 27 21  GLUCOSE 106* 135*  BUN 18 17  CREATININE 0.73 0.68  CALCIUM 9.6 10.3   Liver Function Tests: No results found for this basename: AST, ALT, ALKPHOS, BILITOT, PROT, ALBUMIN,  in the last 168 hours No results found for this basename: LIPASE, AMYLASE,  in the last 168 hours No results found for this basename: AMMONIA,  in the last 168 hours CBC:  Recent Labs Lab 06/28/13 0739 06/29/13 0539  WBC 5.7 7.4  NEUTROABS 3.5  --   HGB 11.7* 11.7*  HCT 35.7* 36.0  MCV 93.0 91.8  PLT 223 260   Cardiac Enzymes: No results found for this basename: CKTOTAL, CKMB, CKMBINDEX, TROPONINI,  in the last 168 hours BNP: No components found with this basename: POCBNP,  CBG: No results found for this basename: GLUCAP,  in the last 168 hours  No results found for this or any previous visit (from the past 240 hour(s)).  Scheduled Meds: . aspirin EC  81 mg Oral QODAY  . benzonatate  100 mg Oral BID  . dexamethasone  4 mg Intravenous 4 times per day  . feeding supplement (ENSURE COMPLETE)  237 mL Oral BID BM  . levETIRAcetam  500 mg Oral BID  . losartan  50 mg Oral Daily  . pantoprazole  40 mg Oral Daily  . sodium chloride  3 mL Intravenous Q12H  . sodium chloride  3 mL Intravenous Q12H   Continuous Infusions:    Saim Almanza, DO  [facility] Pager [phone]  If 7PM-7AM, please contact night-coverage [email or web address] Password [email or web address] 06/30/2013, 9:45 AM   LOS: 2 days

## 2013-06-30 NOTE — Consult Note (Signed)
Patient was seen today in consultation. However when I saw the patient and her family was there as well as Dr. Shanon Brow Tat. There was a discussion ensuing regarding the best course of action in terms of therapy for this patient. The family has decided that they do not want to pursue any further therapies other than comfort measures. The son and daughter felt that they do not want her mother to go through any kind of aggressive treatment such as SRS or surgery. They stated they certainly would not want any  chemotherapy.  We therefore discussed hospice palliative care consultation and possibility of getting the patient home with hospice. Patient does live in [place] so she would be [facility] patient. Question is whether or not she will be able to stay at home hospice or if she would have to be in patient hospice.  I spent 20 minutes counseling the patient face to face. The total time spent in the appointment was 30 minutes in  counseling, evaluation, and coordination of care.  Marcy Panning, MD Medical/Oncology [facility] [phone] (beeper) [phone] (Office)  06/30/2013, 8:00 PM

## 2013-06-30 NOTE — Progress Notes (Signed)
Dr Stanford Breed paged about results of EKG, no orders at this time

## 2013-06-30 NOTE — Progress Notes (Signed)
  Radiation Oncology         [phone] ________________________________  Name: Kayla Lane MRN: [id number]  Date: 06/30/2013  DOB: 1925/09/06  Chart Note:  I received a page as the on-call physician from the hospitalist service regarding this patient.  It was a request to cancel consultation with Dr. Lisbeth Renshaw tomorrow regarding brain metastasis.  I reviewed the patient's record. It appears that she presented with a 3 cm solitary brain metastasis amenable to preoperative stereotactic radiosurgery and surgical resection for tissue diagnosis. CT studies show a mass in the fundus of the stomach which could represent a primary adenocarcinoma or possibly a gastrointestinal stromal tumor. Histology was going to be dependent on tissue from the resection of the symptomatic large brain metastasis.  The patient has reportedly seen Dr. Humphrey Rolls from oncology. 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 Two out of three gastrointestinal stromal tumors shrink by more than 50% with Gleevec.  Given the high response rate to targeted therapy with minimal morbidity, establishing a tissue diagnosis seems to be warranted.  If, the brain metastasis reflects carcinoma from the stomach or another site, the long-term prognosis may not be as promising as gastrointestinal stromal tumor. However, palliation of symptoms by treating her brain metastasis could be accomplished with the original plan of brain metastasis resection.  I encouraged the hospitalist team to obtain a documented oncology consult from radiation oncology or medical oncology prior to switching over to comfort care, given the circumstance of this case.  ________________________________  Sheral Apley. Tammi Klippel, M.D.

## 2013-07-01 ENCOUNTER — Ambulatory Visit: Payer: Medicare Other

## 2013-07-01 ENCOUNTER — Ambulatory Visit: Payer: Medicare Other | Admitting: Radiation Oncology

## 2013-07-01 ENCOUNTER — Telehealth: Payer: Self-pay

## 2013-07-01 DIAGNOSIS — N183 Chronic kidney disease, stage 3 unspecified: Secondary | ICD-10-CM

## 2013-07-01 LAB — CBC
HEMATOCRIT: 36 % (ref 36.0–46.0)
HEMOGLOBIN: 11.6 g/dL — AB (ref 12.0–15.0)
MCH: 29.7 pg (ref 26.0–34.0)
MCHC: 32.2 g/dL (ref 30.0–36.0)
MCV: 92.3 fL (ref 78.0–100.0)
PLATELETS: 259 10*3/uL (ref 150–400)
RBC: 3.9 MIL/uL (ref 3.87–5.11)
RDW: 14.6 % (ref 11.5–15.5)
WBC: 12.2 10*3/uL — AB (ref 4.0–10.5)

## 2013-07-01 LAB — BASIC METABOLIC PANEL
BUN: 30 mg/dL — ABNORMAL HIGH (ref 6–23)
CALCIUM: 9.5 mg/dL (ref 8.4–10.5)
CHLORIDE: 100 meq/L (ref 96–112)
CO2: 20 mEq/L (ref 19–32)
Creatinine, Ser: 0.6 mg/dL (ref 0.50–1.10)
GFR calc Af Amer: >90 mL/min (ref >90)
GFR calc non Af Amer: 81 mL/min — ABNORMAL LOW (ref >90)
GLUCOSE: 133 mg/dL — AB (ref 70–99)
POTASSIUM: 4.9 meq/L (ref 3.7–5.3)
SODIUM: 135 meq/L — AB (ref 137–147)

## 2013-07-01 NOTE — Progress Notes (Signed)
Chaplain gave emotional support to pt and church friends who were visiting.  Chaplain offered a caring presence through conversation and empathic listening.    07/01/13 1600  Clinical Encounter Type  Visited With Patient and family together  Visit Type Spiritual support  Spiritual Encounters  Spiritual Needs Emotional   Estelle June, chaplain pager [phone]

## 2013-07-01 NOTE — Progress Notes (Signed)
[facility] PROGRESS NOTE  Kayla Lane [id number] DOB: 1925/07/15 DOA: 06/28/2013 PCP: Alonza Bogus, MD  Assessment/Plan: Intracranial mass  -Likely a metastatic lesion from GI source  -CT brain on 06/28/2013 showed 2.4 cm ring-enhancing lesion in the left frontal lobe with vasogenic edema  -MRI brain--2.2 cm left anterior parietal lobe enhancing lesion with vasogenic edema  -Appreciate neurosurgery followup  -Plans for radiosurgery simulation on Monday, 07/01/2013--cancelled -Continue Decadron per neurosurgery recommendations  -Continue Keppra  -Stereotactic radiosurgery scheduled for Wednesday, 07/03/2013  Gastric mass  -Concerning for gastric carcinoma  -Dr. Sherwood Gambler has contacted Dr. Humphrey Rolls ([place]) for consultation  -CT abdomen and pelvis shows a 3 cm mass in the gastric fundus with thickened rugal folds; necrotic splenic hilar mass--3 cm; 1.5 cm left adrenal nodule  -appreciate Dr. Fuller Plan input  Goals of care -After discussion with the patient, the family, and medical oncology, it was decided mutually to change the patient's focus of care to that of comfort and quality of life -The family and the patient had expressed they did not want any further surgical intervention or heroic measures -Palliative medicine was consulted and facilitated transition of the patient back home with hospice care--have also assisted with symptom management -[facility] will assist in management 1 the patient is discharged CAD with history of MI  -PCI with stents (BMS) in 2012 to LAD for instent stenosis  -continue ASA  Abnormal EKG  -New T-wave inversions II, III, aVF  -consulted cardiology  -pt is asymptomatic  -No longer active issue as the patient is being transitioned to a focus of comfort care COPD  -Stable  -ambulatory pulseox to assess need for oxygen at home History of colon cancer  -Status post left colectomy 1996  -Patient did not receive  radiation or chemotherapy  History of MAI with bronchiectasis  -Stable  Family Communication: updated daughter at bedside Disposition Plan: Home when medically stable           Procedures/Studies: Dg Chest 2 View  06/18/2013   CLINICAL DATA Chest heaviness, COPD, chest pain for 2 years, history coronary disease post MI and stenting, neck pain radiating to right arm, past history colon cancer, MAI infection  EXAM CHEST  2 VIEW  COMPARISON 01/08/2013, 07/10/2012  FINDINGS Borderline enlargement of cardiac silhouette.  Mediastinal contours and pulmonary vascularity normal.  Right upper lobe nodular density again identified, approximately 20 x 13 mm, increased since 07/10/2012.  New left perihilar mass 30 x 18 mm.  Underlying emphysematous and bronchitic changes consistent with COPD.  Questionable 10 mm right upper lobe nodule.  No definite acute infiltrate, pleural effusion or pneumothorax.  Bones diffusely demineralized with evidence of prior spinal augmentation procedures at adjacent levels at the thoracolumbar junction.  IMPRESSION COPD changes.  Increased right upper lobe and new left perihilar nodules.  CT chest recommended for further evaluation, with contrast if patient's renal function permits.  No acute infiltrate.  Findings discussed with Dr. Luan Pulling at time of interpretation, 1500 hr on 06/18/2013.  SIGNATURE  Electronically Signed   By: Lavonia Dana M.D.   On: 06/18/2013 15:05   Dg Cervical Spine Complete  06/18/2013   CLINICAL DATA Right arm shaking, neck pain radiating down right arm, no known injury, right arm numbness, chest heaviness  EXAM CERVICAL SPINE  4+ VIEWS  COMPARISON None  FINDINGS Osseous demineralization.  Disc space narrowing C5-C6.  Vertebral body and disc space heights otherwise maintained.  Mild scattered facet degenerative changes.  Prevertebral  soft tissues normal thickness.  Bony foramina grossly patent.  Spur tiny uncovertebral spurs noted at C5-C6 bilaterally.  No  acute fracture, subluxation or bone destruction.  Right upper lobe nodular density, see chest radiograph of same date.  C1-C2 alignment normal.  IMPRESSION Mild degenerative disc and facet disease changes as above.  Right upper lobe pulmonary nodule, please refer to chest radiograph report of same date.  SIGNATURE  Electronically Signed   By: Lavonia Dana M.D.   On: 06/18/2013 15:07   Ct Head W Wo Contrast  06/28/2013   CLINICAL DATA:  Right-sided hemiparesis, recent diagnosis of colon cancer  EXAM: CT HEAD WITHOUT AND WITH CONTRAST  TECHNIQUE: Contiguous axial images were obtained from the base of the skull through the vertex without and with intravenous contrast  CONTRAST:  95mL OMNIPAQUE IOHEXOL 300 MG/ML  SOLN  COMPARISON:  CT ABD/PELV WO CM dated 01/03/2013; CT CHEST W/CM dated 06/26/2013  FINDINGS: There is an approximately 2.4 x 1.9 cm peripherally enhancing mass within the high left frontoparietal lobe (image 21, series 3) with associated adjacent vasogenic edema, which given history of malignancy is worrisome for metastatic disease. No additional discrete enhancing intraparenchymal or extra-axial mass.  Mild atrophy with sulcal prominence. Scattered periventricular hypodensities suggestive of microvascular ischemic disease. No definite intraparenchymal or extra-axial hemorrhage. Normal size and configuration of the ventricles and basilar cisterns. No midline shift. There is underpneumatization of the right frontal sinus. Remaining paranasal sinuses and mastoid air cells are normally aerated. Intracranial atherosclerosis. Post bilateral cataract surgery. Regional soft tissues appear normal. No displaced calvarial fracture.  IMPRESSION: 1. Peripherally enhancing approximately 2.4 cm intraparenchymal mass within the left frontoparietal lobe with associated vasogenic edema, which in the presence of a known malignancy is worrisome for a metastasis. Further evaluation with brain MRI could be performed as  clinically indicated. 2. No definitive evidence of intraparenchymal or extra-axial hemorrhage or infarct.   Electronically Signed   By: Sandi Mariscal M.D.   On: 06/28/2013 09:17   Ct Chest W Contrast  06/26/2013   CLINICAL DATA:  Weight loss, pulmonary nodule.  EXAM: CT CHEST WITH CONTRAST  TECHNIQUE: Multidetector CT imaging of the chest was performed during intravenous contrast administration.  CONTRAST:  48mL OMNIPAQUE IOHEXOL 300 MG/ML  SOLN  COMPARISON:  DG CHEST 2 VIEW dated 06/18/2013; CT CHEST W/CM dated 01/25/2011; CT CHEST W/CM dated 11/05/2009; CT CHEST W/O CM dated 10/31/2008; CT ABD/PELV WO CM dated 01/03/2013  FINDINGS: Mediastinal lymph nodes measure up to 8 mm in the low right paratracheal station, unchanged. Right hilar lymph node measures 9 mm. No left hilar or axillary adenopathy. Surgical clips in the left axilla. Coronary artery calcification. Heart size normal. No pericardial effusion.  Lobulated pulmonary nodules measure up to 1.6 x 2.5 cm in the superior segment left lower lobe (image 30) and are new from 01/25/2011. There is an underlying pattern of mildly progressive bronchiectasis, peribronchovascular nodularity and ground-glass, predominating in the right middle lobe and lingula. No pleural fluid. Airway is unremarkable.  Incidental imaging of the upper abdomen shows the visualized portion of the liver, gallbladder and right adrenal gland to be grossly unremarkable. There is a new 1.0 x 1.6 cm nodule in the lateral limb left adrenal gland. There may be stones in the right kidney with renal cortical scarring bilaterally. Spleen is unremarkable. However, a heterogeneous mass in the splenic hilum measures 2.6 x 2.7 cm, new. The proximal gastric wall appears thickened and there may be a low-attenuation mass  along the posterior wall, measuring approximately 1.6 x 2.5 cm (image 51). No upper abdominal adenopathy. No worrisome lytic or sclerotic lesions. Vertebroplasties.  IMPRESSION: 1. New  lobulated bilateral pulmonary nodules, new left adrenal nodule and new heterogeneous mass in the splenic hilum, most indicative of metastatic disease. Given the masslike area of low attenuation in the posterior gastric wall with apparent gastric wall thickening, findings are suspicious for gastric carcinoma. 2. Coronary artery calcification. 3. Underlying pattern of mildly progressive bronchiectasis, peribronchovascular nodularity and ground-glass, indicative of mycobacterium avium complex (MAC). 4. Question right renal stones.   Electronically Signed   By: Lorin Picket M.D.   On: 06/26/2013 17:31   Mr Jeri Cos VQ Contrast  06/29/2013   CLINICAL DATA:  Brain tumor.  Preoperative evaluation.  EXAM: MRI HEAD WITHOUT AND WITH CONTRAST  TECHNIQUE: Multiplanar, multiecho pulse sequences of the brain and surrounding structures were obtained without and with intravenous contrast.  CONTRAST:  41mL MULTIHANCE GADOBENATE DIMEGLUMINE 529 MG/ML IV SOLN  COMPARISON:  Head CT 06/28/2013  FINDINGS: There is a solitary brain mass in the left parietal lobe anteriorly measuring 2.2 cm in diameter with surrounding vasogenic edema. Mild mass effect but no midline shift. No second lesion is identified.  Elsewhere, the brain shows age related atrophy with chronic small vessel change within the hemispheric white matter. No hydrocephalus. No extra-axial collection. No pituitary mass. No inflammatory sinus disease. No skull or skullbase lesion. Major vessels are patent at the base of the brain.  IMPRESSION: 2.2 cm in diameter enhancing mass lesion in the anterior left parietal lobe with surrounding vasogenic edema. In a person of this age. This could be a solitary metastasis or a primary brain tumor. No second lesion.   Electronically Signed   By: Nelson Chimes M.D.   On: 06/29/2013 21:09   Ct Abdomen Pelvis W Contrast  06/29/2013   CLINICAL DATA:  Recent CT chest demonstrating a mass in the splenic hilum and abnormal gastric antrum.   EXAM: CT ABDOMEN AND PELVIS WITH CONTRAST  TECHNIQUE: Multidetector CT imaging of the abdomen and pelvis was performed using the standard protocol following bolus administration of intravenous contrast.  CONTRAST:  51mL OMNIPAQUE IOHEXOL 300 MG/ML IV.  COMPARISON:  CT chest 06/26/2013. CT abdomen and pelvis 01/03/2013, 01/06/2011, 10/21/2009, 09/13/2008.  FINDINGS: Approximate 3.0 x 2.3 x 1.2 cm mass involving the superior wall of the gastric fundus. Marked thickening of the rugal folds of the gastric fundus and the proximal body of the stomach. Thickening of the wall of the gastric fundus has been present previously and is not felt to be pathologic. Normal appearing small bowel. Large stool burden throughout the normal appearing colon. No ascites.  Heterogeneous mass likely representing necrotic lymph nodes in the splenic hilum, adjacent to the gastric fundus, measuring approximately 2.9 x 2.7 x 2.6 cm. No significant lymphadenopathy elsewhere. Approximate 1.1 x 1.3 x 1.5 cm nodule arising from the left adrenal gland with Hounsfield measurements in the 80s, new since the most recent prior CT abdomen and pelvis in September, 2014. Normal-appearing right adrenal gland. Normal liver in spleen. High attenuation material in the gallbladder likely represents spurious excretion of contrast from the enhanced CT head performed earlier today. No biliary ductal dilation. Non obstructing approximate 4 mm calculus in an upper pole calix of the right kidney. Mild diffuse cortical thinning involving both kidneys, consistent with age. Bilateral extrarenal pelves. No significant abnormality involving either kidney. Moderate to severe aortoiliac atherosclerosis without aneurysm.  Uterus surgically  absent. Contrast material in the urinary bladder from the enhanced CT head earlier today. Numerous surgical clips in the pelvis. No adnexal masses or free pelvic fluid. Calcifications in the subcutaneous fat of the midline of the pelvis  is likely dystrophic and related to prior injections.  Bone window images demonstrate generalized osseous demineralization, prior T12 and L1 augmentation, degenerative disc disease, spondylosis, and facet degenerative changes throughout the lumbar spine, and mild degenerative changes in the hips. Visualized lung bases emphysematous with scarring and bronchiectasis in the lingula and right middle lobe, and a calcified granuloma in the lingula. Tree in bud airspace opacities are present in the right lower lobe, not present on the CT chest yesterday. The peribronchovascular nodularity and ground-glass in the lingula and right middle lobe are unchanged.  IMPRESSION: 1. Approximate 3 cm mass involving the superior wall of the gastric fundus, worrisome for gastric carcinoma or GIST. Thickening of the rugal folds of the fundus and proximal body of the stomach is consistent with associated inflammatory changes. 2. Necrotic nodal mass at the splenic hilum, situated between the spleen and the gastric fundus, approximating 3 cm. No significant lymphadenopathy elsewhere in the abdomen or pelvis. 3. 1.5 cm nodule arising from the left adrenal gland, new since the September, 2014 CT, therefore likely metastatic. 4. New tree-in-bud airspace opacities in the right lower lobe since the CT chest 2 days ago. Stable peribronchovascular nodularity and ground-glass opacity in the lingula and and right middle lobe. Stable bronchiectasis in the lingula and right middle lobe. 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 5. Non obstructing approximate 4 mm calculus in an upper pole calix of the right kidney.   Electronically Signed   By: Evangeline Dakin M.D.   On: 06/29/2013 00:11         Subjective: Patient denies any headache, fevers, chills, chest pain, shortness breath, nausea, vomiting, diarrhea, abdominal pain, dysuria.  Objective: Filed Vitals:   07/01/13 0544 07/01/13 0915 07/01/13 1307 07/01/13  1753  BP: 141/51 154/68 113/67 170/85  Pulse: 72 82 89 80  Temp: 97.1 F (36.2 C) 98.1 F (36.7 C) 97.4 F (36.3 C) 98 F (36.7 C)  TempSrc: Oral Oral Oral Oral  Resp: 18 18 18 18   Height:      Weight:      SpO2: 100% 100% 97% 96%   No intake or output data in the 24 hours ending 07/01/13 1821 Weight change:  Exam:   General:  Pt is alert, follows commands appropriately, not in acute distress  HEENT: No icterus, No thrush, /AT  Cardiovascular: RRR, S1/S2, no rubs, no gallops  Respiratory: Bibasilar crackles. No wheezing. Good air movement.  Abdomen: Soft/+BS, non tender, non distended, no guarding  Extremities: No edema, No lymphangitis, No petechiae, No rashes, no synovitis  Data Reviewed: Basic Metabolic Panel:  Recent Labs Lab 06/28/13 0739 06/29/13 0539 07/01/13 0422  NA 139 140 135*  K 4.5 4.2 4.9  CL 102 102 100  CO2 27 21 20   GLUCOSE 106* 135* 133*  BUN 18 17 30*  CREATININE 0.73 0.68 0.60  CALCIUM 9.6 10.3 9.5   Liver Function Tests: No results found for this basename: AST, ALT, ALKPHOS, BILITOT, PROT, ALBUMIN,  in the last 168 hours No results found for this basename: LIPASE, AMYLASE,  in the last 168 hours No results found for this basename: AMMONIA,  in the last 168 hours CBC:  Recent Labs Lab 06/28/13 0739 06/29/13 0539 07/01/13 0448  WBC 5.7 7.4  12.2*  NEUTROABS 3.5  --   --   HGB 11.7* 11.7* 11.6*  HCT 35.7* 36.0 36.0  MCV 93.0 91.8 92.3  PLT 223 260 259   Cardiac Enzymes: No results found for this basename: CKTOTAL, CKMB, CKMBINDEX, TROPONINI,  in the last 168 hours BNP: No components found with this basename: POCBNP,  CBG: No results found for this basename: GLUCAP,  in the last 168 hours  No results found for this or any previous visit (from the past 240 hour(s)).   Scheduled Meds: . aspirin EC  81 mg Oral QODAY  . benzonatate  100 mg Oral BID  . dexamethasone  4 mg Intravenous 4 times per day  . feeding supplement  (ENSURE COMPLETE)  237 mL Oral BID BM  . levETIRAcetam  500 mg Oral BID  . losartan  50 mg Oral Daily  . pantoprazole  40 mg Oral Daily  . sodium chloride  3 mL Intravenous Q12H  . sodium chloride  3 mL Intravenous Q12H   Continuous Infusions:    Samona Chihuahua, DO  [facility] Pager [phone]  If 7PM-7AM, please contact night-coverage [email or web address] Password [facility] 07/01/2013, 6:21 PM   LOS: 3 days

## 2013-07-01 NOTE — Progress Notes (Signed)
Notified by [facility], patient and family request services of [facility]) after discharge.   Spoke at bedside with daughter Jeannene Patella and pt to  initiate education related to hospice services, philosophy and team approach to care; they voiced good understanding of information provided and pt shared hospice cared for her husband 13 years ago when he was ill and she was familiar.  Discharge date not yet determined per daughter; she requests non-emergent transport for pt at d/c as she is not certain pt will be able to navigate steps into the home. Dtr shared pt has been living alone, daughter and her brother live in [place] and [place]- family is trying to arrange private hire caregivers to assist- dtr received list fromn [employer] and was trying to contact agencies;she would like to have these arrangements in place prior to d/c  DME needs discussed pt/dtr request Complete pkg D: fully electric hospital bed with AP&P mattress, overbed table and full rails and add a 3n1 BSC to package *Please call dtr Pam to arrange delivery time [phone] - address on facesheet in EPIC is correct.  Initial paperwork faxed to [facility]  Please notify [facility] when patient is ready to leave unit at d/c call [phone] (or [phone] if after 5 pm);  [facility] information and contact numbers also given to daughter Revonda Humphrey ([phone]) during visit.   Above information shared with [employer] Loma Sousa who will contact [facility] representative to arrange DME Please call with any questions or concerns   Danton Sewer, RN 07/01/2013, 8:14 PM [facility] [place] RN Liaison [phone]

## 2013-07-01 NOTE — Consult Note (Signed)
Patient [name]      DOB: [birth year]      DXI:[id number]  Summary of Goals of care; full note to follow  Met with daughter Kayla Lane, patient Kayla Lane and Sister to patient Kayla Lane   Patient able to speak for herself.  She understands that she has cancer in her chest and brain and has decided that she does not want to pursue treatment or diagnostics.  [place]  Her symptoms at this time are minimal weakness, back and neck pain, anorexia.  Her family is going to make arrangements for someone to be with her 64 /7 . Family expressed that if they had to use hospice facility [place] would not be convenient as most of her family are on the [place] town.  We talked about asking Kayla Lane to consider servicing her since she live right off [address] around the [facility].  Recommend: 1. DNR  2. Back and neck pain: trial lidocaine patch  3.  Children reviewing MOST form  4. COPD, not oxygen dependent.  Consider testing exertional needs for Oxygen.  Continue nebs  5.  Brain lesion on steroids.  Agree with Keppra  Prognosis: less than 6 months or less Disposition: family wants to try to set up care at her house but may need hospice facility in the future. They expressed they would prefer to be in [facility] for her care.   Total time : 200 - 250 pm    Kayla Sieloff L. Lovena Le, MD MBA The Palliative Medicine Team at [facility] Phone: [phone] Pager: [phone]

## 2013-07-01 NOTE — Progress Notes (Signed)
Patient QI:[id number] Kayla Lane      DOB: December 15, 1925      QJJ:[id number] Confirmed 79 meeting today with Daughter Pam.  Dimitrious Micciche L. Lovena Le, MD MBA The Palliative Medicine Team at [facility] Phone: [phone] Pager: [phone]

## 2013-07-01 NOTE — Telephone Encounter (Signed)
LM with answering service for Kayla Lane.  Yes - Dr. Humphrey Rolls will act as attending for pt.  Yes - Dr. Humphrey Rolls would like the assistance of the hospice MD in symptom management.

## 2013-07-01 NOTE — Care Management Note (Unsigned)
    Page 1 of 1   07/01/2013     3:08:44 PM   CARE MANAGEMENT NOTE 07/01/2013  Patient:  KANETRA, HO   Account Number:  [id number]  Date Initiated:  07/01/2013  Documentation initiated by:  Lorne Skeens  Subjective/Objective Assessment:   Patient admitted with stroke symptoms. Dx: with brain tumor and abdomenal mass. Lives at home alone     Action/Plan:   Will follow for discharge needs.   Anticipated DC Date:     Anticipated DC Plan:  [place]  CM consult      Choice offered to / List presented to:  C-4 Adult Children           HH agency  [facility] [place]   Status of service:   Medicare Important Message given?   (If response is "NO", the following Medicare IM given date fields will be blank) Date Medicare IM given:   Date Additional Medicare IM given:    Discharge Disposition:    Per UR Regulation:    If discussed at Long Length of Stay Meetings, dates discussed:    Comments:  07/01/13 [place], MSN, CM- Met with patient, family and Dr Lovena Le with palliative medicine to offer choice for home hospice.  Family has chosen to use [facility] [place] [facility], with plans to transition to inpatient hospice when time is appropriate. Vickie with [facility] was notified of the new referral.  Family was also provided with a list of private duty agencies.   Per Dr Lovena Le, patient will need a hospital bed at discharge.  CM will continue to follow.

## 2013-07-02 DIAGNOSIS — G939 Disorder of brain, unspecified: Secondary | ICD-10-CM

## 2013-07-02 DIAGNOSIS — R634 Abnormal weight loss: Secondary | ICD-10-CM

## 2013-07-02 MED ORDER — LEVETIRACETAM 500 MG PO TABS: 500.0000 mg | ORAL_TABLET | Freq: Two times a day (BID) | ORAL | Status: AC

## 2013-07-02 MED ORDER — DEXAMETHASONE 4 MG PO TABS: 4.0000 mg | ORAL_TABLET | Freq: Three times a day (TID) | ORAL | Status: AC

## 2013-07-02 MED ORDER — LIDOCAINE 5 % EX PTCH
2.0000 | MEDICATED_PATCH | CUTANEOUS | Status: DC
  Administered 2013-07-02 – 2013-07-03 (×2): 2 via TRANSDERMAL
  Filled 2013-07-02 (×2): qty 2

## 2013-07-02 MED ORDER — ENSURE COMPLETE PO LIQD: 237.0000 mL | Freq: Two times a day (BID) | ORAL | Status: AC

## 2013-07-02 MED ORDER — HYDROCODONE-ACETAMINOPHEN 5-325 MG PO TABS
1.0000 | ORAL_TABLET | Freq: Three times a day (TID) | ORAL | Status: DC
  Administered 2013-07-02 – 2013-07-03 (×3): 1 via ORAL
  Filled 2013-07-02 (×3): qty 1

## 2013-07-02 MED ORDER — LIDOCAINE 5 % EX PTCH: 2.0000 | MEDICATED_PATCH | CUTANEOUS | Status: AC

## 2013-07-02 MED ORDER — HYDROCODONE-ACETAMINOPHEN 5-325 MG PO TABS: 1.0000 | ORAL_TABLET | ORAL | Status: AC | PRN

## 2013-07-02 MED ORDER — DEXAMETHASONE 4 MG PO TABS
4.0000 mg | ORAL_TABLET | Freq: Three times a day (TID) | ORAL | Status: DC
  Administered 2013-07-02 – 2013-07-03 (×5): 4 mg via ORAL
  Filled 2013-07-02 (×9): qty 1

## 2013-07-02 NOTE — Discharge Summary (Addendum)
Physician Discharge Summary  CANDYCE GAMBINO UXL:[id number] DOB: 1925-06-26 DOA: 06/28/2013  PCP: Alonza Bogus, MD  Admit date: 06/28/2013 Discharge date: 07/02/2013  Patient going home with focus on comfort care and hospice  Discharge Diagnoses:  Active Problems:   Hypertension   COPD (chronic obstructive pulmonary disease)   Brain tumor   Brain mass   Nonspecific (abnormal) findings on radiological and other examination of gastrointestinal tract   Nonspecific abnormal electrocardiogram (ECG) (EKG) Intracranial mass  -Likely a metastatic lesion from GI source  -CT brain on 06/28/2013 showed 2.4 cm ring-enhancing lesion in the left frontal lobe with vasogenic edema  -MRI brain--2.2 cm left anterior parietal lobe enhancing lesion with vasogenic edema  -Appreciate neurosurgery followup  -Plans for radiosurgery simulation on Monday, 07/01/2013--cancelled  -Continue Decadron for now -The patient will be discharged on Decadron 4 mg 3 times a day po; continue Decadron and any weaning will be directed by the patient's hospice care team after she is discharged--discussed with Dr. Sherwood Gambler on 07/02/13 -Continue Keppra 500 mg twice a day -Stereotactic radiosurgery scheduled for Wednesday, 07/03/2013  Gastric mass  -Concerning for gastric carcinoma  -Dr. Sherwood Gambler has contacted Dr. Humphrey Rolls ([place]) for consultation  -CT abdomen and pelvis shows a 3 cm mass in the gastric fundus with thickened rugal folds; necrotic splenic hilar mass--3 cm; 1.5 cm left adrenal nodule  -appreciate Dr. Fuller Plan input--did not feel that an EGD would add any additional information unless it was necessary for tissue diagnosis. -Nevertheless, the patient's family has decided to transition the patient for comfort care focus. Goals of care  -After discussion with the patient, the family, and medical oncology, it was decided mutually to change the patient's focus of care to that of comfort and quality of life  -The family and  the patient had expressed they did not want any further surgical intervention or heroic measures  -Palliative medicine was consulted and facilitated transition of the patient back home with hospice care--have also assisted with symptom management  -[facility] palliative care of [facility] will assist in management of the patient is discharged  Chronic back pain -The patient will be discharged with Norco 5/325mg , one tablet every 8 hours scheduled and every 4 hours when necessary breakthrough pain -Lidoderm patch-will be prescribed for neck and back pain CAD with history of MI  -PCI with stents (BMS) in 2012 to LAD for instent stenosis  -continue ASA  Abnormal EKG  -New T-wave inversions II, III, aVF  -consulted cardiology  -pt is asymptomatic  -No longer active issue as the patient is being transitioned to a focus of comfort care  COPD  -Stable  -ambulatory pulseox to assess need for oxygen at home  History of colon cancer  -Status post left colectomy 1996  -Patient did not receive radiation or chemotherapy  History of MAI with bronchiectasis  -Stable  Severe protein calorie malnutrition -Continue nutritional supplementation Family Communication: updated daughter at bedside  Disposition Plan: Home when medically stable   Discharge Condition: stable  Disposition: home Follow-up Information   Follow up with [facility] ([facility] to follow at d/c, pls notify whn pt ready to leave unit call [phone] (or if aftr 5 pm [phone]))    Contact information:   [facility] [place]  [phone]      Diet:regular Wt Readings from Last 3 Encounters:  06/28/13 43.999 kg (97 lb)  06/28/13 43.999 kg (97 lb)  06/12/13 44.906 kg (99 lb)    History  of present illness:  Patient admitted to [facility] in transfer from the [facility] emergency room with newly diagnosed metastatic malignancy. Patient had  been feeling weak, chest x-ray was done a week and a half ago it showed multiple: Nodules, and the patient's pulmonologist, Dr. Velvet Bathe, obtained a CT scan of the chest 2d prior to admission. A CT of the chest showed 3 nodules in the right long and another nodule in the left lung, as well as a mass in the posterior wall of the stomach, and a left adrenal mass. On the morning of admission, the patient was having some shaking of the right upper extremity.   She was taken to the [facility] emergency room, she was evaluated by Dr. Betsey Holiday, CT scan of the brain was done without and with contrast, and revealed a ring-enhancing mass lesion in the posterior left frontal lobe with surrounding cerebral edema, and resultant mass effect. The patient was given Decadron 10 mg IV, and neurosurgical consultation was requested. Dr. Sherwood Gambler spoke with Dr. Betsey Holiday, and recommended continuing Decadron 4 mg IV every 6 hours, Protonix daily, and Keppra 500 mg every 12 hours. I recommended admission to the hospitalist service at [facility] for further workup of this newly diagnosed metastatic malignancy.  Patient notes that she's had weight loss since about 2012. She reports her current weight is 97 pounds, in about 6 months ago it was 105 pounds.  She describes walking with a shuffle for the past month, numbness and shaking in the right upper extremity involving the arm, forearm, and hand for the past 3 weeks. Also a tendency to loose the grip with her right hand for the past 3 weeks. She has noticed occasionally some numbness and shaking in the right lower extremity. Her daughter noted some slurred speech on am of admit. Patient describes an occasional dizziness for the past month, but denies any headaches, diplopia, or blurred vision.  The patient does have a history of colon cancer, resected via a left colectomy in 1996. She did not require any postoperative radiation therapy or chemotherapy. She also has a history of  hypertension, history of 2 myocardial infarctions and has had 3 stents placed (most recently in 2012, taking aspirin, but no longer taking Plavix), remote history of peptic ulcer disease, but GERD, history of COPD. She denies any history of stroke or diabetes. After admission, the patient was arranged to have [facility] in preparation for neurosurgical intervention and resection. Medical oncology was consulted. Dr. Humphrey Rolls saw the patient. The patient was continued on Decadron. She was placed on a PPI in the interim. In addition the patient was placed on Keppra. Gastroenterology was consulted. They did not feel that the patient needed any additional procedures at this time unless it was wanted for tissue diagnosis. CT of the abdomen and pelvis revealed a gastric mass. CT of the chest revealed progressive bronchiectasis with groundglass nodular opacities. MRI of the brain revealed a 2.2 cm left anterior parietal lobe enhancing mass with vasogenic edema. A family discussion was held with the patient's daughter and son at the bedside. After discussion with the patient's family and the patient, it was mutually agreed that in the best interest for the patient to transition the focus of care to comfort care. Palliative medicine was consulted. Dr. Billey Chang saw the patient and coordinating care. Hospice and palliative care [facility] was consulted. After discussion with Dr. Billey Chang, the patient's family and the patient reaffirmed a  focus on comfort care. The patient's further surgical interventions and invasive procedures were canceled. The patient was noted to have abnormal EKG changes with T wave inversions in II, III, aVF and V1-V2.  However after discussion with the patient's family and the shift a focus to comfort care, cardiology evaluation was deferred at this time as the patient's family did not want any further workup at this time. With the assistance of hospice and palliative care [place] and  social work, the patient was transitioned to home with hospice care. Durable medical comment was arranged.      Consultants: Neurosurgery Medical oncology--Dr. Humphrey Rolls Palliative medicine  Discharge Exam: Filed Vitals:   07/02/13 1024  BP: 121/72  Pulse: 81  Temp: 97.5 F (36.4 C)  Resp: 20   Filed Vitals:   07/01/13 2219 07/02/13 0132 07/02/13 0538 07/02/13 1024  BP: 170/55 149/75 153/72 121/72  Pulse: 77 67 71 81  Temp: 97.9 F (36.6 C) 97.9 F (36.6 C) 98.1 F (36.7 C) 97.5 F (36.4 C)  TempSrc: Oral Oral Oral Oral  Resp: 20 20 20 20   Height:      Weight:      SpO2: 100% 99% 99% 99%   General: A&O x 3, NAD, pleasant, cooperative Cardiovascular: RRR, no rub, no gallop, no S3 Respiratory: Bibasilar crackles. No wheezing. Good air movement. Abdomen:soft, nontender, nondistended, positive bowel sounds Extremities: No edema, No lymphangitis, no petechiae  Discharge Instructions       Future Appointments Provider Department Dept Phone   02/13/2014 1:00 PM [facility] [place] [facility] [address] [phone]   02/13/2014 2:00 PM [facility] [place] [facility] [address] [phone]   02/13/2014 2:40 PM Sharmon Leyden Nickel, NP [facility] [phone]       Medication List    STOP taking these medications       VITAMIN B-12 IJ      TAKE these medications       albuterol 108 (90 BASE) MCG/ACT inhaler  Commonly known as:  PROVENTIL HFA;VENTOLIN HFA  Inhale 1 puff into the lungs every 12 (twelve) hours as needed.     ALEVE 220 MG tablet  Generic drug:  naproxen sodium  Take 220 mg by mouth 2 (two) times daily with a meal. OTC medication     ALIGN PO  Take by mouth daily.     aspirin EC 81 MG tablet  Take 81 mg by mouth every other day.     benzonatate 100 MG capsule  Commonly known as:  TESSALON  Take 100 mg by mouth 2 (two) times daily. For cough     dexamethasone 4 MG tablet  Commonly known as:   DECADRON  Take 1 tablet (4 mg total) by mouth every 8 (eight) hours.     feeding supplement (ENSURE COMPLETE) Liqd  Take 237 mLs by mouth 2 (two) times daily between meals.     HYDROcodone-acetaminophen 5-325 MG per tablet  Commonly known as:  NORCO/VICODIN  Take 1 tablet by mouth 3 (three) times daily. For pain     HYDROcodone-acetaminophen 5-325 MG per tablet  Commonly known as:  NORCO/VICODIN  Take 1 tablet by mouth every 4 (four) hours as needed for moderate pain.     levETIRAcetam 500 MG tablet  Commonly known as:  KEPPRA  Take 1 tablet (500 mg total) by mouth 2 (two) times daily.     lidocaine 5 %  Commonly known as:  LIDODERM  Place 2  patches onto the skin daily. Remove & Discard patch within 12 hours or as directed by MD     losartan 100 MG tablet  Commonly known as:  COZAAR  Take 0.5 tablets (50 mg total) by mouth daily. TAKE (1/2) TABLET BY MOUTH DAILY.     mupirocin ointment 2 %  Commonly known as:  BACTROBAN     nitroGLYCERIN 0.4 MG SL tablet  Commonly known as:  NITROSTAT  Place 1 tablet (0.4 mg total) under the tongue every 5 (five) minutes as needed.     PREVACID 24HR 15 MG capsule  Generic drug:  lansoprazole  TAKE ONE CAPSULE DAILY.         The results of significant diagnostics from this hospitalization (including imaging, microbiology, ancillary and laboratory) are listed below for reference.    Significant Diagnostic Studies: Dg Chest 2 View  06/18/2013   CLINICAL DATA Chest heaviness, COPD, chest pain for 2 years, history coronary disease post MI and stenting, neck pain radiating to right arm, past history colon cancer, MAI infection  EXAM CHEST  2 VIEW  COMPARISON 01/08/2013, 07/10/2012  FINDINGS Borderline enlargement of cardiac silhouette.  Mediastinal contours and pulmonary vascularity normal.  Right upper lobe nodular density again identified, approximately 20 x 13 mm, increased since 07/10/2012.  New left perihilar mass 30 x 18 mm.  Underlying  emphysematous and bronchitic changes consistent with COPD.  Questionable 10 mm right upper lobe nodule.  No definite acute infiltrate, pleural effusion or pneumothorax.  Bones diffusely demineralized with evidence of prior spinal augmentation procedures at adjacent levels at the thoracolumbar junction.  IMPRESSION COPD changes.  Increased right upper lobe and new left perihilar nodules.  CT chest recommended for further evaluation, with contrast if patient's renal function permits.  No acute infiltrate.  Findings discussed with Dr. Luan Pulling at time of interpretation, 1500 hr on 06/18/2013.  SIGNATURE  Electronically Signed   By: Lavonia Dana M.D.   On: 06/18/2013 15:05   Dg Cervical Spine Complete  06/18/2013   CLINICAL DATA Right arm shaking, neck pain radiating down right arm, no known injury, right arm numbness, chest heaviness  EXAM CERVICAL SPINE  4+ VIEWS  COMPARISON None  FINDINGS Osseous demineralization.  Disc space narrowing C5-C6.  Vertebral body and disc space heights otherwise maintained.  Mild scattered facet degenerative changes.  Prevertebral soft tissues normal thickness.  Bony foramina grossly patent.  Spur tiny uncovertebral spurs noted at C5-C6 bilaterally.  No acute fracture, subluxation or bone destruction.  Right upper lobe nodular density, see chest radiograph of same date.  C1-C2 alignment normal.  IMPRESSION Mild degenerative disc and facet disease changes as above.  Right upper lobe pulmonary nodule, please refer to chest radiograph report of same date.  SIGNATURE  Electronically Signed   By: Lavonia Dana M.D.   On: 06/18/2013 15:07   Ct Head W Wo Contrast  06/28/2013   CLINICAL DATA:  Right-sided hemiparesis, recent diagnosis of colon cancer  EXAM: CT HEAD WITHOUT AND WITH CONTRAST  TECHNIQUE: Contiguous axial images were obtained from the base of the skull through the vertex without and with intravenous contrast  CONTRAST:  58mL OMNIPAQUE IOHEXOL 300 MG/ML  SOLN  COMPARISON:  CT  ABD/PELV WO CM dated 01/03/2013; CT CHEST W/CM dated 06/26/2013  FINDINGS: There is an approximately 2.4 x 1.9 cm peripherally enhancing mass within the high left frontoparietal lobe (image 21, series 3) with associated adjacent vasogenic edema, which given history of malignancy is worrisome for metastatic  disease. No additional discrete enhancing intraparenchymal or extra-axial mass.  Mild atrophy with sulcal prominence. Scattered periventricular hypodensities suggestive of microvascular ischemic disease. No definite intraparenchymal or extra-axial hemorrhage. Normal size and configuration of the ventricles and basilar cisterns. No midline shift. There is underpneumatization of the right frontal sinus. Remaining paranasal sinuses and mastoid air cells are normally aerated. Intracranial atherosclerosis. Post bilateral cataract surgery. Regional soft tissues appear normal. No displaced calvarial fracture.  IMPRESSION: 1. Peripherally enhancing approximately 2.4 cm intraparenchymal mass within the left frontoparietal lobe with associated vasogenic edema, which in the presence of a known malignancy is worrisome for a metastasis. Further evaluation with brain MRI could be performed as clinically indicated. 2. No definitive evidence of intraparenchymal or extra-axial hemorrhage or infarct.   Electronically Signed   By: Sandi Mariscal M.D.   On: 06/28/2013 09:17   Ct Chest W Contrast  06/26/2013   CLINICAL DATA:  Weight loss, pulmonary nodule.  EXAM: CT CHEST WITH CONTRAST  TECHNIQUE: Multidetector CT imaging of the chest was performed during intravenous contrast administration.  CONTRAST:  34mL OMNIPAQUE IOHEXOL 300 MG/ML  SOLN  COMPARISON:  DG CHEST 2 VIEW dated 06/18/2013; CT CHEST W/CM dated 01/25/2011; CT CHEST W/CM dated 11/05/2009; CT CHEST W/O CM dated 10/31/2008; CT ABD/PELV WO CM dated 01/03/2013  FINDINGS: Mediastinal lymph nodes measure up to 8 mm in the low right paratracheal station, unchanged. Right hilar lymph  node measures 9 mm. No left hilar or axillary adenopathy. Surgical clips in the left axilla. Coronary artery calcification. Heart size normal. No pericardial effusion.  Lobulated pulmonary nodules measure up to 1.6 x 2.5 cm in the superior segment left lower lobe (image 30) and are new from 01/25/2011. There is an underlying pattern of mildly progressive bronchiectasis, peribronchovascular nodularity and ground-glass, predominating in the right middle lobe and lingula. No pleural fluid. Airway is unremarkable.  Incidental imaging of the upper abdomen shows the visualized portion of the liver, gallbladder and right adrenal gland to be grossly unremarkable. There is a new 1.0 x 1.6 cm nodule in the lateral limb left adrenal gland. There may be stones in the right kidney with renal cortical scarring bilaterally. Spleen is unremarkable. However, a heterogeneous mass in the splenic hilum measures 2.6 x 2.7 cm, new. The proximal gastric wall appears thickened and there may be a low-attenuation mass along the posterior wall, measuring approximately 1.6 x 2.5 cm (image 51). No upper abdominal adenopathy. No worrisome lytic or sclerotic lesions. Vertebroplasties.  IMPRESSION: 1. New lobulated bilateral pulmonary nodules, new left adrenal nodule and new heterogeneous mass in the splenic hilum, most indicative of metastatic disease. Given the masslike area of low attenuation in the posterior gastric wall with apparent gastric wall thickening, findings are suspicious for gastric carcinoma. 2. Coronary artery calcification. 3. Underlying pattern of mildly progressive bronchiectasis, peribronchovascular nodularity and ground-glass, indicative of mycobacterium avium complex (MAC). 4. Question right renal stones.   Electronically Signed   By: Lorin Picket M.D.   On: 06/26/2013 17:31   Mr Jeri Cos HQ Contrast  06/29/2013   CLINICAL DATA:  Brain tumor.  Preoperative evaluation.  EXAM: MRI HEAD WITHOUT AND WITH CONTRAST   TECHNIQUE: Multiplanar, multiecho pulse sequences of the brain and surrounding structures were obtained without and with intravenous contrast.  CONTRAST:  44mL MULTIHANCE GADOBENATE DIMEGLUMINE 529 MG/ML IV SOLN  COMPARISON:  Head CT 06/28/2013  FINDINGS: There is a solitary brain mass in the left parietal lobe anteriorly measuring 2.2 cm in diameter with  surrounding vasogenic edema. Mild mass effect but no midline shift. No second lesion is identified.  Elsewhere, the brain shows age related atrophy with chronic small vessel change within the hemispheric white matter. No hydrocephalus. No extra-axial collection. No pituitary mass. No inflammatory sinus disease. No skull or skullbase lesion. Major vessels are patent at the base of the brain.  IMPRESSION: 2.2 cm in diameter enhancing mass lesion in the anterior left parietal lobe with surrounding vasogenic edema. In a person of this age. This could be a solitary metastasis or a primary brain tumor. No second lesion.   Electronically Signed   By: Nelson Chimes M.D.   On: 06/29/2013 21:09   Ct Abdomen Pelvis W Contrast  06/29/2013   CLINICAL DATA:  Recent CT chest demonstrating a mass in the splenic hilum and abnormal gastric antrum.  EXAM: CT ABDOMEN AND PELVIS WITH CONTRAST  TECHNIQUE: Multidetector CT imaging of the abdomen and pelvis was performed using the standard protocol following bolus administration of intravenous contrast.  CONTRAST:  66mL OMNIPAQUE IOHEXOL 300 MG/ML IV.  COMPARISON:  CT chest 06/26/2013. CT abdomen and pelvis 01/03/2013, 01/06/2011, 10/21/2009, 09/13/2008.  FINDINGS: Approximate 3.0 x 2.3 x 1.2 cm mass involving the superior wall of the gastric fundus. Marked thickening of the rugal folds of the gastric fundus and the proximal body of the stomach. Thickening of the wall of the gastric fundus has been present previously and is not felt to be pathologic. Normal appearing small bowel. Large stool burden throughout the normal appearing colon.  No ascites.  Heterogeneous mass likely representing necrotic lymph nodes in the splenic hilum, adjacent to the gastric fundus, measuring approximately 2.9 x 2.7 x 2.6 cm. No significant lymphadenopathy elsewhere. Approximate 1.1 x 1.3 x 1.5 cm nodule arising from the left adrenal gland with Hounsfield measurements in the 80s, new since the most recent prior CT abdomen and pelvis in September, 2014. Normal-appearing right adrenal gland. Normal liver in spleen. High attenuation material in the gallbladder likely represents spurious excretion of contrast from the enhanced CT head performed earlier today. No biliary ductal dilation. Non obstructing approximate 4 mm calculus in an upper pole calix of the right kidney. Mild diffuse cortical thinning involving both kidneys, consistent with age. Bilateral extrarenal pelves. No significant abnormality involving either kidney. Moderate to severe aortoiliac atherosclerosis without aneurysm.  Uterus surgically absent. Contrast material in the urinary bladder from the enhanced CT head earlier today. Numerous surgical clips in the pelvis. No adnexal masses or free pelvic fluid. Calcifications in the subcutaneous fat of the midline of the pelvis is likely dystrophic and related to prior injections.  Bone window images demonstrate generalized osseous demineralization, prior T12 and L1 augmentation, degenerative disc disease, spondylosis, and facet degenerative changes throughout the lumbar spine, and mild degenerative changes in the hips. Visualized lung bases emphysematous with scarring and bronchiectasis in the lingula and right middle lobe, and a calcified granuloma in the lingula. Tree in bud airspace opacities are present in the right lower lobe, not present on the CT chest yesterday. The peribronchovascular nodularity and ground-glass in the lingula and right middle lobe are unchanged.  IMPRESSION: 1. Approximate 3 cm mass involving the superior wall of the gastric fundus,  worrisome for gastric carcinoma or GIST. Thickening of the rugal folds of the fundus and proximal body of the stomach is consistent with associated inflammatory changes. 2. Necrotic nodal mass at the splenic hilum, situated between the spleen and the gastric fundus, approximating 3 cm. No significant lymphadenopathy  elsewhere in the abdomen or pelvis. 3. 1.5 cm nodule arising from the left adrenal gland, new since the September, 2014 CT, therefore likely metastatic. 4. New tree-in-bud airspace opacities in the right lower lobe since the CT chest 2 days ago. Stable peribronchovascular nodularity and ground-glass opacity in the lingula and and right middle lobe. Stable bronchiectasis in the lingula and right middle lobe. 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 5. Non obstructing approximate 4 mm calculus in an upper pole calix of the right kidney.   Electronically Signed   By: Evangeline Dakin M.D.   On: 06/29/2013 00:11     Microbiology: No results found for this or any previous visit (from the past 240 hour(s)).   Labs: Basic Metabolic Panel:  Recent Labs Lab 06/28/13 0739 06/29/13 0539 07/01/13 0422  NA 139 140 135*  K 4.5 4.2 4.9  CL 102 102 100  CO2 27 21 20   GLUCOSE 106* 135* 133*  BUN 18 17 30*  CREATININE 0.73 0.68 0.60  CALCIUM 9.6 10.3 9.5   Liver Function Tests: No results found for this basename: AST, ALT, ALKPHOS, BILITOT, PROT, ALBUMIN,  in the last 168 hours No results found for this basename: LIPASE, AMYLASE,  in the last 168 hours No results found for this basename: AMMONIA,  in the last 168 hours CBC:  Recent Labs Lab 06/28/13 0739 06/29/13 0539 07/01/13 0448  WBC 5.7 7.4 12.2*  NEUTROABS 3.5  --   --   HGB 11.7* 11.7* 11.6*  HCT 35.7* 36.0 36.0  MCV 93.0 91.8 92.3  PLT 223 260 259   Cardiac Enzymes: No results found for this basename: CKTOTAL, CKMB, CKMBINDEX, TROPONINI,  in the last 168 hours BNP: No components found with this  basename: POCBNP,  CBG: No results found for this basename: GLUCAP,  in the last 168 hours  Time coordinating discharge:  Greater than 30 minutes  Signed:  Carizma Dunsworth, DO [facility] Pager: [phone] 07/02/2013, 11:06 AM

## 2013-07-02 NOTE — Progress Notes (Signed)
Reviewed intern note. Agree with assessment and intervention.  Pryor Ochoa RD, LDN Inpatient Clinical Dietitian Pager: [phone] After Hours Pager: [phone]

## 2013-07-02 NOTE — Progress Notes (Signed)
Clinical Social Work Department BRIEF PSYCHOSOCIAL ASSESSMENT 07/02/2013  Patient:  Kayla Lane, Kayla Lane     Account Number:  [id number]     Admit date:  06/28/2013  Clinical Social Worker:  Pete Pelt, [place]  Date/Time:  07/02/2013 02:01 PM  Referred by:  Care Management  Date Referred:  07/02/2013 Referred for  Transportation assistance   Other Referral:   Interview type:  Patient Other interview type:   CSW also spoke with the daughter at the bedside.    Revonda Humphrey  [phone]    PSYCHOSOCIAL DATA Living Status:  ALONE Admitted from facility:   Level of care:   Primary support name:  Revonda Humphrey  [phone] Primary support relationship to patient:  CHILD, ADULT Degree of support available:   Pt has a strong support system and will be receiving care from several family members as well as Hospice Services in home.    CURRENT CONCERNS Current Concerns  Other - See comment   Other Concerns:   Pt unable to transport home via private transport due to her current medical state. Pt will need assistance from [facility] services.    SOCIAL WORK ASSESSMENT / PLAN CSW met with the Pt and family at the bedside. CSW introduced self to Pt, Dtr Revonda Humphrey [phone]) and to Pt's nephew Clarice Pole). CSW explained that CSW received a referral  for asssitance with transportation to Pt's home and explained that the transportation would need to have a prior authorization and that CSW would begin that process today in order for the Pt to not receive a bill. Both Pt and family were agreeable to CSW sending Pt information to the insurance company in order to receive the prior auth. Pt's daughter stated they have experienced issues with authorization and was appreciative for CSW begging the process today. Pt's daughter described some barriors to Pt's entrance to her home and the family is aware that a family member would need to be present in order for [facility] to transport. Family acknowledged they  would be home at the time of d/c.    CSW sent Pt information to [facility]) for review and authorization. CSW is awaiting contact back from the insurance company and [place] is aware that Pt will tentatively be d/c'd tomorrow.   Assessment/plan status:  Information/Referral to [employer] Other assessment/ plan:   Information/referral to community resources:   No additional information is needed at this time.    PATIENT'S/FAMILY'S RESPONSE TO PLAN OF CARE: Pt's daughter expresed  concerns and had questions about the transport process and clothing needed. CSW rpovided information and assured family that CSW will provide updates when available. CSW to follow Pt for d/c planning.        [facility]  [address];  [phone] Phone: [phone]

## 2013-07-02 NOTE — Clinical Documentation Improvement (Signed)
Possible Clinical conditions   Underweight w/BMI 18.34  Other condition___________________  Cannot clinically determine _____________  Risk Factors: Per 06/29/13 Nutritional Consult progress note BMI: Body mass index is 18.34 kg/(m^2). Underweight.   Inadequate oral intake related to advanced age and poor appetite as evidenced by progressive weight loss over the past 6 months. Interventions: Recommend liberalize diet to regular.  Ensure Complete PO BID, each supplement provides 350 kcal and 13 grams of protein       Thank You, Serena Colonel ,RN Clinical Documentation Specialist:  [phone]  [employer] [employer]

## 2013-07-02 NOTE — Progress Notes (Signed)
NUTRITION FOLLOW UP  Pt meets criteria for SEVERE malnutrition in the context of chronic illness as evidenced by energy intake </= 75% of estimated energy needs for >/= one month, and severe fat and muscle mass depletion.  DOCUMENTATION CODES  Per approved criteria   -Severe Malnutrition in the context of chronic illness   Intervention:   Continue vanilla Ensure Complete PO BID, each supplement provides 350 kcal and 13 grams of protein.  Liberalize diet to a regular diet (given verbal agreement from Dr. Carles Collet) and downgrade to dysphagia 3 diet.  Nutrition Dx:   Inadequate oral intake related to advanced age and poor appetite as evidenced by progressive weight loss over the past 6 months; Ongoing  Goal:   Intake to meet >90% of estimated nutrition needs, currently unmet but improving  Monitor:   PO intake, weight trends, labs  Assessment:   Patient is an 80 year old woman with history of colon cancer in 1996 and recent CT scan suspicious for gastric carcinoma presented to the emergency department on 3/20 with worsening right arm and right leg tremors and significant weakness when she woke up this morning. Initial evaluation confirmed right-sided weakness and CT of the head revealed intraparenchymal mass with associated vasogenic edema worrisome for metastasis.   3/21-Neurosurgery is following for newly diagnosed metastatic malignancy. Patient is underweight with BMI=18.3. Patient reports that she has been eating well at home and in the hospital. Per discussion with family, she eats small amounts at home. Her weight is down ~5 lbs over the past 6 months.  3/24-Nutritional management was consulted for assessment of nutrition requirements and status.  Daughter was at pt bedside. Daughter reports pt has not been eating a lot over the past couple of years (< ~975 kcal/day (<75% of energy intake). Pt reports getting full quickly. Daughter reports she has been having gradual weight loss over the  years. Pt drinks ensure at home once daily. Pt has been ordered ensure BID and has been tolerating it. Pt reports she eats a lot of soft foods at home due to missing teeth--request for a chopped meat diet- will downgrade diet to dysphagia 3 diet. Daughter requests that pt be liberalized to a regular diet due to her poor PO intake--given verbal consent from Dr. Carles Collet. Daughter encourages pt to eat at meals. Meal completion 50-100%. Pt denies any stomach pains or trouble swallowing.  Nutrition Focused Physical Exam:  Subcutaneous Fat:  Orbital Region: N/A Upper Arm Region: Severe depletion Thoracic and Lumbar Region: Severe depletion  Muscle:  Temple Region: Moderate depletion Clavicle Bone Region: Severe depletion Clavicle and Acromion Bone Region: Severe depletion Scapular Bone Region: N/A Dorsal Hand: Severe depletion Patellar Region: Severe depletion Anterior Thigh Region: Severe depletion Posterior Calf Region: Severe depletion  Edema: none   Height: Ht Readings from Last 1 Encounters:  06/28/13 5\' 1"  (1.549 m)    Weight Status:   Wt Readings from Last 1 Encounters:  06/28/13 97 lb (43.999 kg)    Re-estimated needs:  Kcal: 1300-1500  Protein: 65-75 gm  Fluid: 1.5 L  Skin: no issues noted  Diet Order: Cardiac   Intake/Output Summary (Last 24 hours) at 07/02/13 1030 Last data filed at 07/02/13 0700  Gross per 24 hour  Intake    240 ml  Output      0 ml  Net    240 ml    Last BM: 3/24- soft and large in amount   Labs:   Recent Labs Lab 06/28/13 0739 06/29/13 0539  07/01/13 0422  NA 139 140 135*  K 4.5 4.2 4.9  CL 102 102 100  CO2 27 21 20   BUN 18 17 30*  CREATININE 0.73 0.68 0.60  CALCIUM 9.6 10.3 9.5  GLUCOSE 106* 135* 133*    CBG (last 3)  No results found for this basename: GLUCAP,  in the last 72 hours  Scheduled Meds: . aspirin EC  81 mg Oral QODAY  . benzonatate  100 mg Oral BID  . dexamethasone  4 mg Oral 3 times per day  . feeding  supplement (ENSURE COMPLETE)  237 mL Oral BID BM  . HYDROcodone-acetaminophen  1 tablet Oral TID  . levETIRAcetam  500 mg Oral BID  . losartan  50 mg Oral Daily  . pantoprazole  40 mg Oral Daily  . sodium chloride  3 mL Intravenous Q12H  . sodium chloride  3 mL Intravenous Q12H    Continuous Infusions:   Kallie Locks Dietetic Intern Pager: [phone]

## 2013-07-03 ENCOUNTER — Ambulatory Visit: Payer: Medicare Other | Admitting: Radiation Oncology

## 2013-07-03 DIAGNOSIS — C189 Malignant neoplasm of colon, unspecified: Secondary | ICD-10-CM

## 2013-07-03 DIAGNOSIS — E43 Unspecified severe protein-calorie malnutrition: Secondary | ICD-10-CM | POA: Insufficient documentation

## 2013-07-03 NOTE — Discharge Summary (Signed)
BINNIE DROESSLER FTD:[id number] DOB: 12-31-1925 DOA: 06/28/2013  PCP: Alonza Bogus, MD   Admit date: 06/28/2013   Discharge date: 07/03/2013   Patient going home with focus on comfort care and hospice   Discharge Diagnoses:  Active Problems:  Hypertension  COPD (chronic obstructive pulmonary disease)  Brain tumor  Brain mass  Nonspecific (abnormal) findings on radiological and other examination of gastrointestinal tract  Nonspecific abnormal electrocardiogram (ECG) (EKG)   Intracranial mass  -Likely a metastatic lesion from GI source  -CT brain on 06/28/2013 showed 2.4 cm ring-enhancing lesion in the left frontal lobe with vasogenic edema  -MRI brain--2.2 cm left anterior parietal lobe enhancing lesion with vasogenic edema  -Appreciate neurosurgery followup  -Plans for radiosurgery simulation on Monday, 07/01/2013--cancelled  -Continue Decadron for now  -The patient will be discharged on Decadron 4 mg 3 times a day po; continue Decadron and any weaning will be directed by the patient's hospice care team after she is discharged--discussed with Dr. Sherwood Gambler on 07/02/13  -Continue Keppra 500 mg twice a day  -Stereotactic radiosurgery scheduled for Wednesday, 07/03/2013   Gastric mass  -Concerning for gastric carcinoma  -Dr. Sherwood Gambler has contacted Dr. Humphrey Rolls ([place]) for consultation  -CT abdomen and pelvis shows a 3 cm mass in the gastric fundus with thickened rugal folds; necrotic splenic hilar mass--3 cm; 1.5 cm left adrenal nodule  -appreciate Dr. Fuller Plan input--did not feel that an EGD would add any additional information unless it was necessary for tissue diagnosis.  -Nevertheless, the patient's family has decided to transition the patient for comfort care focus.   Goals of care  -After discussion with the patient, the family, and medical oncology, it was decided mutually to change the patient's focus of care to that of comfort and quality of life  -The family and the patient had  expressed they did not want any further surgical intervention or heroic measures  -Palliative medicine was consulted and facilitated transition of the patient back home with hospice care--have also assisted with symptom management  -[facility]ospice and palliative care of [facility] will assist in management of the patient is discharged  Chronic back pain  -The patient will be discharged with Norco 5/325mg , one tablet every 8 hours scheduled and every 4 hours when necessary breakthrough pain  -Lidoderm patch-will be prescribed for neck and back pain   CAD with history of MI  -PCI with stents (BMS) in 2012 to LAD for instent stenosis  -continue ASA   Abnormal EKG  -New T-wave inversions II, III, aVF  -consulted cardiology  -pt is asymptomatic  -No longer active issue as the patient is being transitioned to a focus of comfort care   COPD  -Stable  -ambulatory pulseox to assess need for oxygen at home   History of colon cancer  -Status post left colectomy 1996  -Patient did not receive radiation or chemotherapy   History of MAI with bronchiectasis  -Stable   Severe protein calorie malnutrition  -Continue nutritional supplementation  Family Communication: updated daughter at bedside  Disposition Plan: Home with home hospice  Discharge Condition: stable  Disposition: home  Follow-up Information    [facility] ([facility] to follow at d/c, pls notify whn pt ready to leave unit call [phone] (or if aftr 5 pm [phone]))    Contact information:    [facility]  [place]  [phone]      Diet:regular  Wt Readings from Last 3 Encounters:  06/28/13  43.999 kg (97 lb)   06/28/13  43.999 kg (97 lb)   06/12/13  44.906 kg (99 lb)    History of present illness:  Patient admitted to [facility] in transfer from the [facility] emergency room with newly diagnosed metastatic malignancy. Patient  had been feeling weak, chest x-ray was done a week and a half ago it showed multiple: Nodules, and the patient's pulmonologist, Dr. Shaune Pollack, obtained a CT scan of the chest 2d prior to admission. A CT of the chest showed 3 nodules in the right long and another nodule in the left lung, as well as a mass in the posterior wall of the stomach, and a left adrenal mass. On the morning of admission, the patient was having some shaking of the right upper extremity. She was taken to the [facility] emergency room, she was evaluated by Dr. Blinda Leatherwood, CT scan of the brain was done without and with contrast, and revealed a ring-enhancing mass lesion in the posterior left frontal lobe with surrounding cerebral edema, and resultant mass effect. The patient was given Decadron 10 mg IV, and neurosurgical consultation was requested. Dr. Newell Coral spoke with Dr. Blinda Leatherwood, and recommended continuing Decadron 4 mg IV every 6 hours, Protonix daily, and Keppra 500 mg every 12 hours. I recommended admission to the hospitalist service at [facility] for further workup of this newly diagnosed metastatic malignancy.  Patient notes that she's had weight loss since about 2012. She reports her current weight is 97 pounds, in about 6 months ago it was 105 pounds.  She describes walking with a shuffle for the past month, numbness and shaking in the right upper extremity involving the arm, forearm, and hand for the past 3 weeks. Also a tendency to loose the grip with her right hand for the past 3 weeks. She has noticed occasionally some numbness and shaking in the right lower extremity. Her daughter noted some slurred speech on am of admit. Patient describes an occasional dizziness for the past month, but denies any headaches, diplopia, or blurred vision.  The patient does have a history of colon cancer, resected via a left colectomy in 1996. She did not require any postoperative radiation therapy or chemotherapy. She also has a history of  hypertension, history of 2 myocardial infarctions and has had 3 stents placed (most recently in 2012, taking aspirin, but no longer taking Plavix), remote history of peptic ulcer disease, but GERD, history of COPD. She denies any history of stroke or diabetes. After admission, the patient was arranged to have SRS simulation in preparation for neurosurgical intervention and resection. Medical oncology was consulted. Dr. Welton Flakes saw the patient. The patient was continued on Decadron. She was placed on a PPI in the interim. In addition the patient was placed on Keppra. Gastroenterology was consulted. They did not feel that the patient needed any additional procedures at this time unless it was wanted for tissue diagnosis. CT of the abdomen and pelvis revealed a gastric mass. CT of the chest revealed progressive bronchiectasis with groundglass nodular opacities. MRI of the brain revealed a 2.2 cm left anterior parietal lobe enhancing mass with vasogenic edema. A family discussion was held with the patient's daughter and son at the bedside. After discussion with the patient's family and the patient, it was mutually agreed that in the best interest for the patient to transition the focus of care to comfort care. Palliative medicine was consulted. Dr. Derenda Mis saw the patient  and coordinating care. Hospice and palliative care [facility] was consulted. After discussion with Dr. Billey Chang, the patient's family and the patient reaffirmed a focus on comfort care. The patient's further surgical interventions and invasive procedures were canceled. The patient was noted to have abnormal EKG changes with T wave inversions in II, III, aVF and V1-V2. However after discussion with the patient's family and the shift a focus to comfort care, cardiology evaluation was deferred at this time as the patient's family did not want any further workup at this time. With the assistance of hospice and palliative care [place] and  social work, the patient was transitioned to home with hospice care. Durable medical comment was arranged.   I evaluated patient on 07/03/13, she appeared to be comfortable, sitting up having her breakfast, no acute distress. Reaffirmed goals of care, and plans to discharge home with home hospice.   Consultants:  Neurosurgery  Medical oncology--Dr. Humphrey Rolls  Palliative medicine   Discharge Exam:  Filed Vitals:    07/02/13 1024   BP:  121/72   Pulse:  81   Temp:  97.5 F (36.4 C)   Resp:  20    Filed Vitals:    07/01/13 2219  07/02/13 0132  07/02/13 0538  07/02/13 1024   BP:  170/55  149/75  153/72  121/72   Pulse:  77  67  71  81   Temp:  97.9 F (36.6 C)  97.9 F (36.6 C)  98.1 F (36.7 C)  97.5 F (36.4 C)   TempSrc:  Oral  Oral  Oral  Oral   Resp:  20  20  20  20    Height:       Weight:       SpO2:  100%  99%  99%  99%    General: A&O x 3, NAD, pleasant, cooperative  Cardiovascular: RRR, no rub, no gallop, no S3  Respiratory: Bibasilar crackles. No wheezing. Good air movement.  Abdomen:soft, nontender, nondistended, positive bowel sounds  Extremities: No edema, No lymphangitis, no petechiae  Discharge Instructions       Future Appointments  Provider  Department  Dept Phone    02/13/2014 1:00 PM  [facility]  [place] [facility] [address]  [phone]    02/13/2014 2:00 PM  [facility]  [place] [facility] [address]  [phone]    02/13/2014 2:40 PM  Sharmon Leyden Nickel, NP  [facility]  [phone]        Medication List     STOP taking these medications       VITAMIN B-12 IJ     TAKE these medications       albuterol 108 (90 BASE) MCG/ACT inhaler    Commonly known as: PROVENTIL HFA;VENTOLIN HFA    Inhale 1 puff into the lungs every 12 (twelve) hours as needed.    ALEVE 220 MG tablet    Generic drug: naproxen sodium    Take 220 mg by mouth 2 (two) times daily with a meal. OTC medication    ALIGN PO    Take  by mouth daily.    aspirin EC 81 MG tablet    Take 81 mg by mouth every other day.    benzonatate 100 MG capsule    Commonly known as: TESSALON    Take 100 mg by mouth 2 (two) times daily. For cough    dexamethasone 4 MG tablet    Commonly known as: DECADRON  Take 1 tablet (4 mg total) by mouth every 8 (eight) hours.    feeding supplement (ENSURE COMPLETE) Liqd    Take 237 mLs by mouth 2 (two) times daily between meals.    HYDROcodone-acetaminophen 5-325 MG per tablet    Commonly known as: NORCO/VICODIN    Take 1 tablet by mouth 3 (three) times daily. For pain    HYDROcodone-acetaminophen 5-325 MG per tablet    Commonly known as: NORCO/VICODIN    Take 1 tablet by mouth every 4 (four) hours as needed for moderate pain.    levETIRAcetam 500 MG tablet    Commonly known as: KEPPRA    Take 1 tablet (500 mg total) by mouth 2 (two) times daily.    lidocaine 5 %    Commonly known as: LIDODERM    Place 2 patches onto the skin daily. Remove & Discard patch within 12 hours or as directed by MD    losartan 100 MG tablet    Commonly known as: COZAAR    Take 0.5 tablets (50 mg total) by mouth daily. TAKE (1/2) TABLET BY MOUTH DAILY.    mupirocin ointment 2 %    Commonly known as: BACTROBAN    nitroGLYCERIN 0.4 MG SL tablet    Commonly known as: NITROSTAT    Place 1 tablet (0.4 mg total) under the tongue every 5 (five) minutes as needed.    PREVACID 24HR 15 MG capsule    Generic drug: lansoprazole    TAKE ONE CAPSULE DAILY.       The results of significant diagnostics from this hospitalization (including imaging, microbiology, ancillary and laboratory) are listed below for reference.   Significant Diagnostic Studies:  Dg Chest 2 View  06/18/2013 CLINICAL DATA Chest heaviness, COPD, chest pain for 2 years, history coronary disease post MI and stenting, neck pain radiating to right arm, past history colon cancer, MAI infection EXAM CHEST 2 VIEW COMPARISON 01/08/2013, 07/10/2012 FINDINGS  Borderline enlargement of cardiac silhouette. Mediastinal contours and pulmonary vascularity normal. Right upper lobe nodular density again identified, approximately 20 x 13 mm, increased since 07/10/2012. New left perihilar mass 30 x 18 mm. Underlying emphysematous and bronchitic changes consistent with COPD. Questionable 10 mm right upper lobe nodule. No definite acute infiltrate, pleural effusion or pneumothorax. Bones diffusely demineralized with evidence of prior spinal augmentation procedures at adjacent levels at the thoracolumbar junction. IMPRESSION COPD changes. Increased right upper lobe and new left perihilar nodules. CT chest recommended for further evaluation, with contrast if patient's renal function permits. No acute infiltrate. Findings discussed with Dr. Luan Pulling at time of interpretation, 1500 hr on 06/18/2013. SIGNATURE Electronically Signed By: Lavonia Dana M.D. On: 06/18/2013 15:05  Dg Cervical Spine Complete  06/18/2013 CLINICAL DATA Right arm shaking, neck pain radiating down right arm, no known injury, right arm numbness, chest heaviness EXAM CERVICAL SPINE 4+ VIEWS COMPARISON None FINDINGS Osseous demineralization. Disc space narrowing C5-C6. Vertebral body and disc space heights otherwise maintained. Mild scattered facet degenerative changes. Prevertebral soft tissues normal thickness. Bony foramina grossly patent. Spur tiny uncovertebral spurs noted at C5-C6 bilaterally. No acute fracture, subluxation or bone destruction. Right upper lobe nodular density, see chest radiograph of same date. C1-C2 alignment normal. IMPRESSION Mild degenerative disc and facet disease changes as above. Right upper lobe pulmonary nodule, please refer to chest radiograph report of same date. SIGNATURE Electronically Signed By: Lavonia Dana M.D. On: 06/18/2013 15:07  Ct Head W Wo Contrast  06/28/2013 CLINICAL DATA: Right-sided hemiparesis, recent diagnosis of colon  cancer EXAM: CT HEAD WITHOUT AND WITH CONTRAST  TECHNIQUE: Contiguous axial images were obtained from the base of the skull through the vertex without and with intravenous contrast CONTRAST: 91mL OMNIPAQUE IOHEXOL 300 MG/ML SOLN COMPARISON: CT ABD/PELV WO CM dated 01/03/2013; CT CHEST W/CM dated 06/26/2013 FINDINGS: There is an approximately 2.4 x 1.9 cm peripherally enhancing mass within the high left frontoparietal lobe (image 21, series 3) with associated adjacent vasogenic edema, which given history of malignancy is worrisome for metastatic disease. No additional discrete enhancing intraparenchymal or extra-axial mass. Mild atrophy with sulcal prominence. Scattered periventricular hypodensities suggestive of microvascular ischemic disease. No definite intraparenchymal or extra-axial hemorrhage. Normal size and configuration of the ventricles and basilar cisterns. No midline shift. There is underpneumatization of the right frontal sinus. Remaining paranasal sinuses and mastoid air cells are normally aerated. Intracranial atherosclerosis. Post bilateral cataract surgery. Regional soft tissues appear normal. No displaced calvarial fracture. IMPRESSION: 1. Peripherally enhancing approximately 2.4 cm intraparenchymal mass within the left frontoparietal lobe with associated vasogenic edema, which in the presence of a known malignancy is worrisome for a metastasis. Further evaluation with brain MRI could be performed as clinically indicated. 2. No definitive evidence of intraparenchymal or extra-axial hemorrhage or infarct. Electronically Signed By: Sandi Mariscal M.D. On: 06/28/2013 09:17  Ct Chest W Contrast  06/26/2013 CLINICAL DATA: Weight loss, pulmonary nodule. EXAM: CT CHEST WITH CONTRAST TECHNIQUE: Multidetector CT imaging of the chest was performed during intravenous contrast administration. CONTRAST: 18mL OMNIPAQUE IOHEXOL 300 MG/ML SOLN COMPARISON: DG CHEST 2 VIEW dated 06/18/2013; CT CHEST W/CM dated 01/25/2011; CT CHEST W/CM dated 11/05/2009; CT CHEST W/O CM  dated 10/31/2008; CT ABD/PELV WO CM dated 01/03/2013 FINDINGS: Mediastinal lymph nodes measure up to 8 mm in the low right paratracheal station, unchanged. Right hilar lymph node measures 9 mm. No left hilar or axillary adenopathy. Surgical clips in the left axilla. Coronary artery calcification. Heart size normal. No pericardial effusion. Lobulated pulmonary nodules measure up to 1.6 x 2.5 cm in the superior segment left lower lobe (image 30) and are new from 01/25/2011. There is an underlying pattern of mildly progressive bronchiectasis, peribronchovascular nodularity and ground-glass, predominating in the right middle lobe and lingula. No pleural fluid. Airway is unremarkable. Incidental imaging of the upper abdomen shows the visualized portion of the liver, gallbladder and right adrenal gland to be grossly unremarkable. There is a new 1.0 x 1.6 cm nodule in the lateral limb left adrenal gland. There may be stones in the right kidney with renal cortical scarring bilaterally. Spleen is unremarkable. However, a heterogeneous mass in the splenic hilum measures 2.6 x 2.7 cm, new. The proximal gastric wall appears thickened and there may be a low-attenuation mass along the posterior wall, measuring approximately 1.6 x 2.5 cm (image 51). No upper abdominal adenopathy. No worrisome lytic or sclerotic lesions. Vertebroplasties. IMPRESSION: 1. New lobulated bilateral pulmonary nodules, new left adrenal nodule and new heterogeneous mass in the splenic hilum, most indicative of metastatic disease. Given the masslike area of low attenuation in the posterior gastric wall with apparent gastric wall thickening, findings are suspicious for gastric carcinoma. 2. Coronary artery calcification. 3. Underlying pattern of mildly progressive bronchiectasis, peribronchovascular nodularity and ground-glass, indicative of mycobacterium avium complex (MAC). 4. Question right renal stones. Electronically Signed By: Lorin Picket M.D. On:  06/26/2013 17:31  Mr Jeri Cos UE Contrast  06/29/2013 CLINICAL DATA: Brain tumor. Preoperative evaluation. EXAM: MRI HEAD WITHOUT AND WITH CONTRAST TECHNIQUE: Multiplanar, multiecho pulse sequences of the brain and  surrounding structures were obtained without and with intravenous contrast. CONTRAST: 21mL MULTIHANCE GADOBENATE DIMEGLUMINE 529 MG/ML IV SOLN COMPARISON: Head CT 06/28/2013 FINDINGS: There is a solitary brain mass in the left parietal lobe anteriorly measuring 2.2 cm in diameter with surrounding vasogenic edema. Mild mass effect but no midline shift. No second lesion is identified. Elsewhere, the brain shows age related atrophy with chronic small vessel change within the hemispheric white matter. No hydrocephalus. No extra-axial collection. No pituitary mass. No inflammatory sinus disease. No skull or skullbase lesion. Major vessels are patent at the base of the brain. IMPRESSION: 2.2 cm in diameter enhancing mass lesion in the anterior left parietal lobe with surrounding vasogenic edema. In a person of this age. This could be a solitary metastasis or a primary brain tumor. No second lesion. Electronically Signed By: Nelson Chimes M.D. On: 06/29/2013 21:09  Ct Abdomen Pelvis W Contrast  06/29/2013 CLINICAL DATA: Recent CT chest demonstrating a mass in the splenic hilum and abnormal gastric antrum. EXAM: CT ABDOMEN AND PELVIS WITH CONTRAST TECHNIQUE: Multidetector CT imaging of the abdomen and pelvis was performed using the standard protocol following bolus administration of intravenous contrast. CONTRAST: 62mL OMNIPAQUE IOHEXOL 300 MG/ML IV. COMPARISON: CT chest 06/26/2013. CT abdomen and pelvis 01/03/2013, 01/06/2011, 10/21/2009, 09/13/2008. FINDINGS: Approximate 3.0 x 2.3 x 1.2 cm mass involving the superior wall of the gastric fundus. Marked thickening of the rugal folds of the gastric fundus and the proximal body of the stomach. Thickening of the wall of the gastric fundus has been present previously  and is not felt to be pathologic. Normal appearing small bowel. Large stool burden throughout the normal appearing colon. No ascites. Heterogeneous mass likely representing necrotic lymph nodes in the splenic hilum, adjacent to the gastric fundus, measuring approximately 2.9 x 2.7 x 2.6 cm. No significant lymphadenopathy elsewhere. Approximate 1.1 x 1.3 x 1.5 cm nodule arising from the left adrenal gland with Hounsfield measurements in the 80s, new since the most recent prior CT abdomen and pelvis in September, 2014. Normal-appearing right adrenal gland. Normal liver in spleen. High attenuation material in the gallbladder likely represents spurious excretion of contrast from the enhanced CT head performed earlier today. No biliary ductal dilation. Non obstructing approximate 4 mm calculus in an upper pole calix of the right kidney. Mild diffuse cortical thinning involving both kidneys, consistent with age. Bilateral extrarenal pelves. No significant abnormality involving either kidney. Moderate to severe aortoiliac atherosclerosis without aneurysm. Uterus surgically absent. Contrast material in the urinary bladder from the enhanced CT head earlier today. Numerous surgical clips in the pelvis. No adnexal masses or free pelvic fluid. Calcifications in the subcutaneous fat of the midline of the pelvis is likely dystrophic and related to prior injections. Bone window images demonstrate generalized osseous demineralization, prior T12 and L1 augmentation, degenerative disc disease, spondylosis, and facet degenerative changes throughout the lumbar spine, and mild degenerative changes in the hips. Visualized lung bases emphysematous with scarring and bronchiectasis in the lingula and right middle lobe, and a calcified granuloma in the lingula. Tree in bud airspace opacities are present in the right lower lobe, not present on the CT chest yesterday. The peribronchovascular nodularity and ground-glass in the lingula and right  middle lobe are unchanged. IMPRESSION: 1. Approximate 3 cm mass involving the superior wall of the gastric fundus, worrisome for gastric carcinoma or GIST. Thickening of the rugal folds of the fundus and proximal body of the stomach is consistent with associated inflammatory changes. 2. Necrotic nodal mass at  the splenic hilum, situated between the spleen and the gastric fundus, approximating 3 cm. No significant lymphadenopathy elsewhere in the abdomen or pelvis. 3. 1.5 cm nodule arising from the left adrenal gland, new since the September, 2014 CT, therefore likely metastatic. 4. New tree-in-bud airspace opacities in the right lower lobe since the CT chest 2 days ago. Stable peribronchovascular nodularity and ground-glass opacity in the lingula and and right middle lobe. Stable bronchiectasis in the lingula and right middle lobe. 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 5. Non obstructing approximate 4 mm calculus in an upper pole calix of the right kidney. Electronically Signed By: Evangeline Dakin M.D. On: 06/29/2013 00:11  Microbiology:  No results found for this or any previous visit (from the past 240 hour(s)).  Labs:  Basic Metabolic Panel:   Recent Labs  Lab  06/28/13 0739  06/29/13 0539  07/01/13 0422   NA  139  140  135*   K  4.5  4.2  4.9   CL  102  102  100   CO2  27  21  20    GLUCOSE  106*  135*  133*   BUN  18  17  30*   CREATININE  0.73  0.68  0.60   CALCIUM  9.6  10.3  9.5    Liver Function Tests:  No results found for this basename: AST, ALT, ALKPHOS, BILITOT, PROT, ALBUMIN, in the last 168 hours  No results found for this basename: LIPASE, AMYLASE, in the last 168 hours  No results found for this basename: AMMONIA, in the last 168 hours  CBC:   Recent Labs  Lab  06/28/13 0739  06/29/13 0539  07/01/13 0448   WBC  5.7  7.4  12.2*   NEUTROABS  3.5  --  --   HGB  11.7*  11.7*  11.6*   HCT  35.7*  36.0  36.0   MCV  93.0  91.8  92.3   PLT  223   260  259    Cardiac Enzymes:  No results found for this basename: CKTOTAL, CKMB, CKMBINDEX, TROPONINI, in the last 168 hours  BNP:  No components found with this basename: POCBNP,  CBG:  No results found for this basename: GLUCAP, in the last 168 hours  Time coordinating discharge: Greater than 30 minutes

## 2013-07-03 NOTE — Progress Notes (Signed)
Pt to be d/c today to home.   Pt and family agreeable. Confirmed plans with family.  Plan transfer via EMS.    [facility]  [address];  [phone] Phone: [phone]

## 2013-07-03 NOTE — Progress Notes (Signed)
   CSW received [facility] authorization for medical transport to Pt's home today.   Authorization number is  [id number].   CSW will continue to follow Pt for d/c planning.    [facility]  [address];  [phone] Phone: [phone]

## 2013-07-03 NOTE — Progress Notes (Signed)
CM CONSULT Talked to [facility] ( daughter) DME/ equipment was delivered to the home yesterday - package D/ [facility] to return today to make adjustments to the hospital bed for home today; Family to hire private sitters also; Patient is to be discharged via ambulance today; B Tamera Punt RN,BSN,MHA

## 2013-07-05 ENCOUNTER — Inpatient Hospital Stay: Admit: 2013-07-05 | Payer: Medicare Other | Admitting: Neurosurgery

## 2013-07-05 SURGERY — CRANIOTOMY TUMOR EXCISION
Anesthesia: General

## 2013-07-25 ENCOUNTER — Telehealth: Payer: Self-pay

## 2013-07-25 NOTE — Telephone Encounter (Signed)
Taran from [facility] called [phone] with update on pt.  Kayla Lane is rapidly declining, unable to swallow, aspiration.  Hospice MD has ordered O2 for comfort, liquid morrphine, and has started discontinuing medications.  We can call Taran or Hospice [phone] if there are any questions.  Routed to [facility]

## 2013-07-25 NOTE — Telephone Encounter (Signed)
Thank you :)

## 2013-07-26 ENCOUNTER — Telehealth: Payer: Self-pay

## 2013-08-09 NOTE — Telephone Encounter (Signed)
Rcvd call today from Santiago Glad at [facility] [place] advising that pt had passed away today 08-07-2013 ay 11:55 am.  Any questions we should contact Hospice office - Santiago Glad will not be in after 5 today.  Routed to [facility] ([facility]

## 2013-08-09 DEATH — deceased

## 2014-02-10 ENCOUNTER — Encounter (HOSPITAL_COMMUNITY): Payer: Self-pay | Admitting: Emergency Medicine

## 2014-02-13 ENCOUNTER — Encounter (HOSPITAL_COMMUNITY): Payer: Medicare Other

## 2014-02-13 ENCOUNTER — Ambulatory Visit: Payer: Medicare Other | Admitting: Family

## 2014-02-13 ENCOUNTER — Other Ambulatory Visit (HOSPITAL_COMMUNITY): Payer: Medicare Other
